# Patient Record
Sex: Female | Born: 1985 | Race: White | Hispanic: No | Marital: Single | Smoking: Former smoker
Health system: Southern US, Community
[De-identification: ages and names within clinical notes are randomized; demographics above are authoritative.]

## PROBLEM LIST (undated history)

## (undated) DIAGNOSIS — F909 Attention-deficit hyperactivity disorder, unspecified type: Secondary | ICD-10-CM

## (undated) DIAGNOSIS — F419 Anxiety disorder, unspecified: Secondary | ICD-10-CM

## (undated) DIAGNOSIS — F329 Major depressive disorder, single episode, unspecified: Secondary | ICD-10-CM

## (undated) DIAGNOSIS — E282 Polycystic ovarian syndrome: Secondary | ICD-10-CM

## (undated) DIAGNOSIS — F32A Depression, unspecified: Secondary | ICD-10-CM

## (undated) DIAGNOSIS — O3690X0 Maternal care for fetal problem, unspecified, unspecified trimester, not applicable or unspecified: Secondary | ICD-10-CM

## (undated) DIAGNOSIS — R569 Unspecified convulsions: Secondary | ICD-10-CM

## (undated) DIAGNOSIS — O4400 Placenta previa specified as without hemorrhage, unspecified trimester: Secondary | ICD-10-CM

## (undated) DIAGNOSIS — G40909 Epilepsy, unspecified, not intractable, without status epilepticus: Secondary | ICD-10-CM

## (undated) HISTORY — DX: Epilepsy, unspecified, not intractable, without status epilepticus: G40.909

## (undated) HISTORY — PX: HX BREAST REDUCTION: SHX8

## (undated) HISTORY — DX: Attention-deficit hyperactivity disorder, unspecified type: F90.9

## (undated) HISTORY — DX: Polycystic ovarian syndrome: E28.2

## (undated) HISTORY — DX: Anxiety disorder, unspecified: F41.9

## (undated) HISTORY — PX: HX TONSILLECTOMY: SHX27

## (undated) HISTORY — PX: HX ADENOIDECTOMY: SHX29

## (undated) HISTORY — PX: REVISION OF SCAR: SHX2348

## (undated) HISTORY — PX: TONSILLECTOMY: SUR1361

## (undated) HISTORY — PX: BREAST REDUCTION SURGERY: SHX8

---
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
## 2007-06-05 HISTORY — PX: REDUCTION MAMMAPLASTY: SUR839

## 2008-12-27 ENCOUNTER — Ambulatory Visit: Admission: RE | Admit: 2008-12-27 | Payer: Self-pay | Source: Ambulatory Visit | Admitting: Plastic Surgery

## 2009-02-03 ENCOUNTER — Emergency Department: Admit: 2009-02-03 | Payer: Self-pay | Source: Emergency Department | Admitting: Emergency Medicine

## 2009-02-04 ENCOUNTER — Ambulatory Visit
Admit: 2009-02-04 | Disposition: A | Discharge status: Home / Self Care | Payer: Self-pay | Source: Ambulatory Visit | Admitting: Plastic Surgery

## 2009-12-02 ENCOUNTER — Ambulatory Visit: Payer: Self-pay

## 2011-03-23 NOTE — Op Note (Unsigned)
Account Number: [id number]      Document ID: [id number]      Admit Date: 12/27/2008      Procedure Date: 12/27/2008            Patient Location: DISCHARGED 12/27/2008      Patient Type: A            SURGEON: Gilda Crease MD      ASSISTANT:                  PREOPERATIVE DIAGNOSIS:      Bilateral breast hypertrophy, symptomatic.            POSTOPERATIVE DIAGNOSIS:      Bilateral breast hypertrophy, symptomatic.            TITLE OF PROCEDURE:      Bilateral reduction mammoplasty            ASSISTANT:      Steward Ros, CSA            ANESTHESIA:      General, Dr. Micheline Rough.            INTRAVENOUS FLUIDS:      Two liters crystalloid.            ESTIMATED BLOOD LOSS:      200 mL.            DRAINS:      One 15-French Blake round fluted drain to anterior chest wall.            CATHETERS:      On-Q dual 300 mL 0.25% plain Marcaine in a 270 mL pump.            SPECIMENS:      Resected breast tissue.  Weight:  Right approximately 646 grams, left 962      grams.            COMPLICATIONS:      None.            COUNTS:      Correct at completion of procedure.            PATHOLOGY:      The patient is a 25 year old nulliparous female who presents with      symptomatic back, neck, shoulder strain, rashes under the breasts, grooving      in the shoulders for which she presents for medically necessary breast      reduction.  The patient's breasts are asymmetric with the left greater than      right, and this will be addressed in the reduction procedure.            DESCRIPTION OF PROCEDURE:      The patient was marked preoperatively in the holding area with an inferior      Weiss pattern inferior pedicle technique prior to being taken to the      operating room, placed on the table in supine position.  After satisfactory      general anesthesia was induced, the patient's chest was prepped and draped      in the usual sterile fashion.  I proceeded to mark 42-mm diameter native      nipple-areolar complexes bilaterally injected 100 mL of  0.5% Xylocaine with      epinephrine locally along the areas delineated to be incised.  I proceeded      on the right, placing a tourniquet around the breast, de-epithelializing a      6-cm  inferior pedicle and circumareolarly.  I then removed the tourniquet      and completed de-epithelialization down to the level of the inframammary      fold.  The redundant skin, subcutaneous, and glandular tissue from the      medial and lateral lower quadrants respectively was removed as well as from      the intervening tissue between the native nipple-areolar complex and the      desired new position.  Antibiotic irrigation was performed.  Hemostasis was      obtained with cautery.  Percutaneous placement of the 15-French Blake round      fluted drain was inserted in the lateral inframammary fold and secured with      3-0 nylon suture.  Percutaneous placement of the On-Q catheter was from the      medial lower quadrant and this was secured with Mastisol, Steri-Strips and      Op-Site.  I then approximated the medial and lateral skin flaps in the      median pole of the breast along the inframammary fold with a single 2-0      Vicryl suture.  Closure of the dermis was performed with 3-0 and 4-0 Vicryl      in the horizontal and vertical limbs.  I then brought the native      nipple-areolar complex 5 cm from the fold.  It was viable at the time it      was secured to the surrounding skin paddle using interrupted 5-0 Vicryl, a      5-0 Prolene pursestring suture, and a 5-0 Monocryl subcuticularly.  Closure      of the horizontal and vertical limbs was completed with 4-0 Monocryl      subcuticularly.  The drain was placed to self suction.  I then turned my      attention to the left breast and performed the same procedure, delineating      the native nipple-areolar complex at 42 mm, de-epithelializing      circumferentially and down to the level of the inframammary folds.      Excision of the redundant tissues was performed.   Hemostasis checked and      obtained with cautery.  Percutaneous placement of the drain in the lateral      inframammary fold and the On-Q catheters through the inframammary fold      medially and securing similarly to the chest wall.  Closure was performed      with 2-0, 3-0 and 4-0 Vicryl in the dermis, 4-0 Monocryl in the vertical      and horizontal limbs and the native nipple-areolar complex again was      brought out 5 cm from the fold along the vertical limb, excising the      overlying skin paddle.  Hemostasis again was obtained.  Native      nipple-areolar complex was viable when brought out and closure was      performed to the surrounding skin using 5-0 Vicryl, 5-0 Prolene      pursestring, and a 5-0 Monocryl.  The wounds were then dressed,  bacitracin      ointment and Xeroform placed on the nipple-areolar complexes.  Mastisol,      Steri-Strips, 4 x 4's, and ABDs to the entire wound and a post-surgical      compression bra.  While removing the drapes, the stitch on the left drain      placed in the  anterior wall broke and the drain dislodged and it was      decided not to replace it as the breast did not appear ecchymotic and would      require reopening the patient's chest wound.  The patient was extubated      without difficulty and talking prior to leaving the OR suite and arriving      in the recovery room in stable condition.  There were no complications.                              _______________________________     Date/Time Signed: _____________      Gilda Crease MD [phone])            D:  12/27/2008 15:00 PM by Dr. Norvel Richards. Kirt Boys, MD [phone])      T:  12/28/2008 08:58 AM by [id number]          Everlean Cherry: [id number]) (Doc ID: [id number])                  HY:QMVH Kirt Boys MD

## 2012-06-05 NOTE — Op Note (Unsigned)
SURGEON:             Emmie Niemann, MD      ADMITTED:            08/23/2003      PROCEDURE DATE: 08/23/2003      ROOM NUMBER:         POO POO 08            PREOPERATIVE DIAGNOSIS:  Chronic tonsillitis.            POSTOPERATIVE DIAGNOSIS:  Chronic tonsillitis.            PROCEDURE:  Tonsillectomy.            ASSISTANTS:  None.            ANESTHESIA:  General endotracheal.            SPECIMENS TO ANATOMICAL PATHOLOGY:  Palatine tonsils.            FINDINGS:  Cryptic tonsils with pericapsular fibrosis.            PROCEDURE IN DETAIL:  The patient was brought to the operating room, placed      on the operating table in the supine position.  Following induction of      general endotracheal anesthesia, the patient was positioned for      tonsillectomy and draped in the usual manner.  A Crowe-Davis mouth gag was      atraumatically introduced into the oral cavity which was retracted open,      and the mouth gag was then placed in gentle suspension using a Mayo stand.      Next, the left tonsil was grasped with a curved Allis clamp and dissected      out in the plane between the capsule and the underlying constrictor muscle      using a spatula-tipped Bovie.            In identical technical fashion, the right tonsil was removed.  Hemostasis      was then achieved using the bipolar cautery device followed by closure of      the tonsillar fossae by approximating the anterior and posterior pillars      using an inverted horizontal mattress stitch of 4-0 Vicryl suture.  Next,      the wounds were infiltrated with 0.5% plain Marcaine for a total of 10 mL      for the purpose of postsurgical analgesia.                              ___________________________________     Date Signed: _______________      Emmie Niemann, MD                  D 08/23/2003  8:27 P; T 08/24/2003 11:27 A; [id number] - - , [id number], #[id number]      CC:  Emmie Niemann, MD

## 2012-06-05 NOTE — Op Note (Unsigned)
SURGEON:             Emmie Niemann, MD      ADMITTED:            08/23/2003      PROCEDURE DATE: 08/23/2003      ROOM NUMBER:         POO POO 08            PREOPERATIVE DIAGNOSIS:  Chronic tonsillitis.            POSTOPERATIVE DIAGNOSIS:  Chronic tonsillitis.            PROCEDURE      1.   Tonsillectomy.      2.   Therapeutic injection.            ASSISTANTS:  None.            ANESTHESIA:  General endotracheal.            SPECIMENS TO ANATOMICAL PATHOLOGY:  Palatine tonsils.            FINDINGS:  Cryptic tonsils and peritonsillar fibrosis.            PROCEDURE IN DETAIL:  The patient was brought to the operating room, placed      on the operating table in the supine position.  Following induction of      general endotracheal anesthesia, the patient was positioned for      tonsillectomy and draped in the usual manner.  Next, a Crowe-Davis mouth      gag was introduced into the oral cavity which was retracted open, and the      mouth gag was then placed in gentle suspension using a Mayo stent.  The      left tonsil was grasped and removed using a spatula Bovie in the plane      between the capsule and the underlying constrictor muscle.            In identical fashion, the right tonsil was also removed.  Hemostasis was      achieved using the bipolar cautery device.  The anterior and posterior      tonsillar pillars were approximated using a horizontal mattress suture of      4-0 Vicryl followed by infiltration of the wounds with 0.5% plain Marcaine      for a total of 10 mL.      Mouth gag was then taken down and removed from the patient.  The head was      restored to neutral position for preparation of emergence from general      anesthetic.            The patient tolerated the procedure well and was extubated in the operating      room and brought to recovery room in excellent condition.                              ___________________________________     Date Signed: _______________      Emmie Niemann, MD                   D 08/23/2003  8:30 P; T 08/24/2003 11:30 A; 9229 - - , [id number], #[id number]      CC:  Emmie Niemann, MD

## 2013-12-21 ENCOUNTER — Emergency Department (HOSPITAL_COMMUNITY)
Admission: EM | Admit: 2013-12-21 | Discharge: 2013-12-22 | Disposition: A | Discharge status: Home / Self Care | Payer: BC Managed Care – PPO | Attending: Emergency Medicine | Admitting: Emergency Medicine

## 2013-12-21 ENCOUNTER — Emergency Department (HOSPITAL_COMMUNITY): Payer: BC Managed Care – PPO

## 2013-12-21 ENCOUNTER — Encounter (HOSPITAL_COMMUNITY): Payer: Self-pay | Admitting: Emergency Medicine

## 2013-12-21 DIAGNOSIS — W269XXA Contact with unspecified sharp object(s), initial encounter: Secondary | ICD-10-CM | POA: Insufficient documentation

## 2013-12-21 DIAGNOSIS — Z862 Personal history of diseases of the blood and blood-forming organs and certain disorders involving the immune mechanism: Secondary | ICD-10-CM | POA: Insufficient documentation

## 2013-12-21 DIAGNOSIS — F17200 Nicotine dependence, unspecified, uncomplicated: Secondary | ICD-10-CM | POA: Insufficient documentation

## 2013-12-21 DIAGNOSIS — F3289 Other specified depressive episodes: Secondary | ICD-10-CM | POA: Insufficient documentation

## 2013-12-21 DIAGNOSIS — F909 Attention-deficit hyperactivity disorder, unspecified type: Secondary | ICD-10-CM | POA: Insufficient documentation

## 2013-12-21 DIAGNOSIS — F411 Generalized anxiety disorder: Secondary | ICD-10-CM | POA: Insufficient documentation

## 2013-12-21 DIAGNOSIS — S0993XA Unspecified injury of face, initial encounter: Secondary | ICD-10-CM

## 2013-12-21 DIAGNOSIS — Z8639 Personal history of other endocrine, nutritional and metabolic disease: Secondary | ICD-10-CM | POA: Insufficient documentation

## 2013-12-21 DIAGNOSIS — Z23 Encounter for immunization: Secondary | ICD-10-CM | POA: Insufficient documentation

## 2013-12-21 DIAGNOSIS — Z3202 Encounter for pregnancy test, result negative: Secondary | ICD-10-CM | POA: Insufficient documentation

## 2013-12-21 DIAGNOSIS — Y9389 Activity, other specified: Secondary | ICD-10-CM | POA: Insufficient documentation

## 2013-12-21 DIAGNOSIS — F329 Major depressive disorder, single episode, unspecified: Secondary | ICD-10-CM | POA: Insufficient documentation

## 2013-12-21 DIAGNOSIS — Y929 Unspecified place or not applicable: Secondary | ICD-10-CM | POA: Insufficient documentation

## 2013-12-21 DIAGNOSIS — W503XXA Accidental bite by another person, initial encounter: Secondary | ICD-10-CM | POA: Insufficient documentation

## 2013-12-21 DIAGNOSIS — R259 Unspecified abnormal involuntary movements: Secondary | ICD-10-CM | POA: Insufficient documentation

## 2013-12-21 DIAGNOSIS — Z79899 Other long term (current) drug therapy: Secondary | ICD-10-CM | POA: Insufficient documentation

## 2013-12-21 DIAGNOSIS — IMO0002 Reserved for concepts with insufficient information to code with codable children: Secondary | ICD-10-CM | POA: Insufficient documentation

## 2013-12-21 DIAGNOSIS — T148XXA Other injury of unspecified body region, initial encounter: Secondary | ICD-10-CM

## 2013-12-21 HISTORY — DX: Polycystic ovarian syndrome: E28.2

## 2013-12-21 HISTORY — DX: Major depressive disorder, single episode, unspecified: F32.9

## 2013-12-21 HISTORY — DX: Attention-deficit hyperactivity disorder, unspecified type: F90.9

## 2013-12-21 HISTORY — DX: Anxiety disorder, unspecified: F41.9

## 2013-12-21 HISTORY — DX: Depression, unspecified: F32.A

## 2013-12-21 LAB — CBC WITH DIFFERENTIAL/PLATELET
Basophils Absolute: 0.1 10*3/uL (ref 0.0–0.1)
Basophils Relative: 0 % (ref 0–1)
Eosinophils Absolute: 0.1 10*3/uL (ref 0.0–0.7)
Eosinophils Relative: 1 % (ref 0–5)
HEMATOCRIT: 41.7 % (ref 36.0–46.0)
HEMOGLOBIN: 14 g/dL (ref 12.0–15.0)
LYMPHS PCT: 23 % (ref 12–46)
Lymphs Abs: 3.3 10*3/uL (ref 0.7–4.0)
MCH: 29.4 pg (ref 26.0–34.0)
MCHC: 33.6 g/dL (ref 30.0–36.0)
MCV: 87.4 fL (ref 78.0–100.0)
MONOS PCT: 6 % (ref 3–12)
Monocytes Absolute: 0.8 10*3/uL (ref 0.1–1.0)
NEUTROS ABS: 10.1 10*3/uL — AB (ref 1.7–7.7)
NEUTROS PCT: 70 % (ref 43–77)
Platelets: 374 10*3/uL (ref 150–400)
RBC: 4.77 MIL/uL (ref 3.87–5.11)
RDW: 13.1 % (ref 11.5–15.5)
WBC: 14.4 10*3/uL — AB (ref 4.0–10.5)

## 2013-12-21 LAB — COMPREHENSIVE METABOLIC PANEL
ALBUMIN: 3.8 g/dL (ref 3.5–5.2)
ALK PHOS: 68 U/L (ref 39–117)
ALT: 30 U/L (ref 0–35)
ANION GAP: 14 (ref 5–15)
AST: 23 U/L (ref 0–37)
BUN: 7 mg/dL (ref 6–23)
CALCIUM: 9.7 mg/dL (ref 8.4–10.5)
CO2: 25 mEq/L (ref 19–32)
CREATININE: 0.85 mg/dL (ref 0.50–1.10)
Chloride: 99 mEq/L (ref 96–112)
GFR calc non Af Amer: >90 mL/min (ref >90)
GLUCOSE: 96 mg/dL (ref 70–99)
Potassium: 3.8 mEq/L (ref 3.7–5.3)
Sodium: 138 mEq/L (ref 137–147)
Total Bilirubin: 0.3 mg/dL (ref 0.3–1.2)
Total Protein: 7.6 g/dL (ref 6.0–8.3)

## 2013-12-21 LAB — PREGNANCY, URINE: Preg Test, Ur: NEGATIVE

## 2013-12-21 LAB — ETHANOL

## 2013-12-21 LAB — MAGNESIUM: Magnesium: 1.8 mg/dL (ref 1.5–2.5)

## 2013-12-21 LAB — RAPID URINE DRUG SCREEN, HOSP PERFORMED
Amphetamines: POSITIVE — AB
BENZODIAZEPINES: POSITIVE — AB
Barbiturates: NOT DETECTED
Cocaine: NOT DETECTED
OPIATES: NOT DETECTED
Tetrahydrocannabinol: POSITIVE — AB

## 2013-12-21 MED ORDER — TETANUS-DIPHTH-ACELL PERTUSSIS 5-2.5-18.5 LF-MCG/0.5 IM SUSP
0.5000 mL | Freq: Once | INTRAMUSCULAR | Status: AC
  Administered 2013-12-21: 0.5 mL via INTRAMUSCULAR
  Filled 2013-12-21: qty 0.5

## 2013-12-21 NOTE — ED Provider Notes (Signed)
Medical screening examination/treatment/procedure(s) were performed by non-physician practitioner and as supervising physician I was immediately available for consultation/collaboration.   EKG Interpretation None        Tameika Heckmann, MD 12/21/13 2327 

## 2013-12-21 NOTE — Progress Notes (Signed)
  CARE MANAGEMENT ED NOTE 12/21/2013  Patient:  Regina EckFRITZ,Savon   Account Number:  [id number]  Date Initiated:  12/21/2013  Documentation initiated by:  Radford PaxFERRERO,Laurelle Skiver  Subjective/Objective Assessment:   Patient presents to Ed feeling shakey after noticing cuts to fingers, bit her tongue.     Subjective/Objective Assessment Detail:   Patient with pmhx of PCOS, anxiety, depression, ADHD     Action/Plan:   Action/Plan Detail:   Anticipated DC Date:       Status Recommendation to Physician:   Result of Recommendation:    Other ED Services  Consult Working Plan    DC Planning Services  Other  PCP issues    Choice offered to / List presented to:            Status of service:  Completed, signed off  ED Comments:   ED Comments Detail:  EDCM spoke to patient at bedside.  Patient reports her pcp is Dr. Farris HasAaron Morrow.  System updated.

## 2013-12-21 NOTE — ED Notes (Signed)
Patient states she was sitting at her computer working and suddenly realized she was bleeding on he right hand, and had bitten her tongue. Bite marks visualized to left side of tongue. Patient also c/o left chin/jaw pain and left shoulder pain. Patient unsure if she had LOC, remembers events up to and following, but is unable to state what happened to cause bleeding, pain. Patient c/o feeling very tired and generalized weakness. Patient denies any prior similar episodes, denies seizure history. Patient alert and oriented at this time. Patient was home alone when event occurred.

## 2013-12-21 NOTE — ED Provider Notes (Signed)
CSN: [id number]     Arrival date & time 12/21/13  1946 History   First MD Initiated Contact with Patient 12/21/13 2104     Chief Complaint  Patient presents with  . Shaking     (Consider location/radiation/quality/duration/timing/severity/associated sxs/prior Treatment) HPI Comments: Patient is a 28 year old female with history of PCOS, anxiety, depression, ADHD who presents today after an episode where she cut her hand and tongue. She reports that she was working at her kitchen table on the computer. If she was very focused with what she was doing. She was sitting in a chair. She realized that she was bleeding. There is blood on the table and chair. She has abrasions to her right first, second, fifth finger and her left elbow. She has abrasions to her left tongue. She does not remember how she obtained these abrasions. She states when she realized she was bleeding she was aware of what she was doing. She does not remember any trauma to her body. She was in the same spot she was in prior to this episode. She remained in a sitting position. No urinary incontinece. She states that she felt well, but later became generally very weak. This was not witnessed by anyone. She's never had anything like this in the past. No recent medication changes. She is unsure of her last tetanus shot.   The history is provided by the patient. No language interpreter was used.    Past Medical History  Diagnosis Date  . PCOS (polycystic ovarian syndrome)   . Anxiety   . Depression   . ADHD (attention deficit hyperactivity disorder)    Past Surgical History  Procedure Laterality Date  . Breast reduction surgery    . Tonsillectomy     No family history on file. History  Substance Use Topics  . Smoking status: Current Some Day Smoker  . Smokeless tobacco: Not on file  . Alcohol Use: Yes     Comment: socially   OB History   Grav Para Term Preterm Abortions TAB SAB Ect Mult Living                 Review  of Systems  Constitutional: Negative for fever and chills.  Respiratory: Negative for shortness of breath.   Cardiovascular: Negative for chest pain.  Skin: Positive for wound.  Neurological: Positive for seizures (possible).  All other systems reviewed and are negative.     Allergies  Review of patient's allergies indicates no known allergies.  Home Medications   Prior to Admission medications   Medication Sig Start Date End Date Taking? Authorizing Provider  amphetamine-dextroamphetamine (ADDERALL) 20 MG tablet Take 20 mg by mouth 2 (two) times daily.   Yes Historical Provider, MD  FLUoxetine (PROZAC) 20 MG capsule Take 60 mg by mouth daily.   Yes Historical Provider, MD  metFORMIN (GLUCOPHAGE) 500 MG tablet Take 500 mg by mouth 3 (three) times daily.   Yes Historical Provider, MD  Norgestimate-Eth Estradiol (SPRINTEC 28 PO) Take 1 tablet by mouth daily.   Yes Historical Provider, MD  spironolactone (ALDACTONE) 50 MG tablet Take 50 mg by mouth daily.   Yes Historical Provider, MD   BP 136/91  Pulse 108  Temp(Src) 98.6 F (37 C) (Oral)  Resp 18  Ht 5\' 5"  (1.651 m)  Wt 220 lb (99.791 kg)  BMI 36.61 kg/m2  SpO2 98%  LMP 12/19/2013 Physical Exam  Nursing note and vitals reviewed. Constitutional: She is oriented to person, place, and time. She appears  well-developed and well-nourished. No distress.  No acute distress  HENT:  Head: Normocephalic and atraumatic.  Right Ear: External ear normal.  Left Ear: External ear normal.  Nose: Nose normal.  Mouth/Throat: Oropharynx is clear and moist.  Superficial abrasions to left side of tongue. No broken or loose teeth.   Eyes: Conjunctivae and EOM are normal. Pupils are equal, round, and reactive to light.  Neck: Normal range of motion.  Cardiovascular: Normal rate, regular rhythm, normal heart sounds, intact distal pulses and normal pulses.   Pulses:      Radial pulses are 2+ on the right side, and 2+ on the left side.        Posterior tibial pulses are 2+ on the right side, and 2+ on the left side.  Pulmonary/Chest: Effort normal and breath sounds normal. No stridor. No respiratory distress. She has no wheezes. She has no rales.  Abdominal: Soft. She exhibits no distension. There is no tenderness.  Musculoskeletal: Normal range of motion.  Moves all extremities without guarding or ataxia.   Neurological: She is alert and oriented to person, place, and time. She has normal strength. No cranial nerve deficit (III-XII). Coordination and gait normal. GCS eye subscore is 4. GCS verbal subscore is 5. GCS motor subscore is 6.  Grip strength 5/5 bilaterally. Finger nose finger normal.  Strength 5/5 in all extremities.   Skin: Skin is warm and dry. Abrasion noted. She is not diaphoretic. No erythema.  Superficial abrasions to right 1, 2, and 5th fingers at PIP. Superficial abrasion to left elbow.  Psychiatric: She has a normal mood and affect. Her behavior is normal.    ED Course  Procedures (including critical care time) Labs Review Labs Reviewed  URINALYSIS, ROUTINE W REFLEX MICROSCOPIC - Abnormal; Notable for the following:    Color, Urine AMBER (*)    APPearance TURBID (*)    Hgb urine dipstick TRACE (*)    All other components within normal limits  URINE RAPID DRUG SCREEN (HOSP PERFORMED) - Abnormal; Notable for the following:    Benzodiazepines POSITIVE (*)    Amphetamines POSITIVE (*)    Tetrahydrocannabinol POSITIVE (*)    All other components within normal limits  CBC WITH DIFFERENTIAL - Abnormal; Notable for the following:    WBC 14.4 (*)    Neutro Abs 10.1 (*)    All other components within normal limits  URINE MICROSCOPIC-ADD ON - Abnormal; Notable for the following:    Squamous Epithelial / LPF FEW (*)    Bacteria, UA MANY (*)    All other components within normal limits  PREGNANCY, URINE  ETHANOL  COMPREHENSIVE METABOLIC PANEL  MAGNESIUM    Imaging Review Ct Head Wo Contrast  12/21/2013    CLINICAL DATA:  Question of seizure. Found cuts on right hand, and bit tongue. Shakiness.  EXAM: CT HEAD WITHOUT CONTRAST  TECHNIQUE: Contiguous axial images were obtained from the base of the skull through the vertex without intravenous contrast.  COMPARISON:  None.  FINDINGS: There is no evidence of acute infarction, mass lesion, or intra- or extra-axial hemorrhage on CT.  The posterior fossa, including the cerebellum, brainstem and fourth ventricle, is within normal limits. The third and lateral ventricles, and basal ganglia are unremarkable in appearance. The cerebral hemispheres are symmetric in appearance, with normal gray-white differentiation. No mass effect or midline shift is seen.  There is no evidence of fracture; visualized osseous structures are unremarkable in appearance. The orbits are within normal limits. The paranasal  sinuses and mastoid air cells are well-aerated. No significant soft tissue abnormalities are seen.  IMPRESSION: Unremarkable noncontrast CT of the head.   Electronically Signed   By: Roanna RaiderJeffery  Chang M.D.   On: 12/21/2013 23:55     EKG Interpretation   Date/Time:  Monday December 21 2013 23:28:11 EDT Ventricular Rate:  79 PR Interval:  116 QRS Duration: 90 QT Interval:  397 QTC Calculation: 455 R Axis:   37 Text Interpretation:  Sinus rhythm Borderline short PR interval Low  voltage, precordial leads No old tracing to compare Confirmed by CAMPOS   MD, Caryn BeeKEVIN ([id number]) on 12/21/2013 11:45:09 PM      MDM   Final diagnoses:  Abrasion  Tongue injury, initial encounter   Patient presents to ED after having an episode of tongue biting and sustaining abrasions for which she does not remember. Patient is currently at her baseline. Labs and imaging unremarkable. No hx or Fhx of seizures. This story is concerning for seizure. Patient was instructed to follow up with neurology as an outpatient. She understands that she cannot drive until she has been cleared by neurology. I  discussed reasons to return to ED immediately. Vital signs stable for discharge. Patient / Family / Caregiver informed of clinical course, understand medical decision-making process, and agree with plan.     Mora BellmanHannah S Columbia Pandey, PA-C 12/22/13 1110

## 2013-12-21 NOTE — ED Notes (Signed)
Pt states that she was sitting at the table working on the computer for work; pt states that she was concentrating on what she was doing and then she noticed that she had some cuts to her rt 1st, 2nd and 5th finger; superficial cuts - bleeding controlled; pt states that she had also bit her tongue; pt does not know what happened  To her hand; pt denies injury; pt denies LOC; pt states that after she noticed it she felt "shakey"; pt denies shakiness at present

## 2013-12-21 NOTE — ED Provider Notes (Signed)
MSE was initiated and I personally evaluated the patient and placed orders (if any) at  10:29 PM on December 21, 2013.  Patient is a 28 year old female with a history of PCOS, anxiety, depression, and ADHD who presents to the emergency department following episode of right hand injury and tongue biting. Patient states that she was sitting in a high rise chair in her kitchen when she all of a sudden noticed blood on her shirt and the chair next to her. Patient states that she looked down and noted abrasions to the PIP of her 2nd, 3rd, and 5th digits of her R hand. She states she also felt paresthesias to the L side of her face as well as noticing associated trauma to the L side of her tongue. Patient unsure of LOC. Denies known falls or head trauma. No hx of seizures or FHx of seizures. No recent medication changes.  Brief neurologic exam is nonfocal. Heart RRR and lungs CTAB. Speech is goal oriented and GCS 15. Patient c/o generalized fatigue at this time. Story concerning for seizures. Work up initiated to this effect.  The patient appears stable so that the remainder of the MSE may be completed by another provider.  Antony MaduraKelly Krithik Mapel, PA-C 12/21/13 2234  Antony MaduraKelly Mieshia Pepitone, PA-C 12/21/13 2234

## 2013-12-22 LAB — URINALYSIS, ROUTINE W REFLEX MICROSCOPIC
BILIRUBIN URINE: NEGATIVE
Glucose, UA: NEGATIVE mg/dL
Ketones, ur: NEGATIVE mg/dL
Leukocytes, UA: NEGATIVE
Nitrite: NEGATIVE
PH: 5.5 (ref 5.0–8.0)
Protein, ur: NEGATIVE mg/dL
SPECIFIC GRAVITY, URINE: 1.027 (ref 1.005–1.030)
Urobilinogen, UA: 0.2 mg/dL (ref 0.0–1.0)

## 2013-12-22 LAB — URINE MICROSCOPIC-ADD ON

## 2013-12-22 NOTE — Discharge Instructions (Signed)
Today your story is concerning for seizure. Please do not drive until you have been cleared by a neurologist.   Abrasions An abrasion is a cut or scrape of the skin. Abrasions do not go through all layers of the skin. HOME CARE  If a bandage (dressing) was put on your wound, change it as told by your doctor. If the bandage sticks, soak it off with warm.  Wash the area with water and soap 2 times a day. Rinse off the soap. Pat the area dry with a clean towel.  Put on medicated cream (ointment) as told by your doctor.  Change your bandage right away if it gets wet or dirty.  Only take medicine as told by your doctor.  See your doctor within 24-48 hours to get your wound checked.  Check your wound for redness, puffiness (swelling), or yellowish-white fluid (pus). GET HELP RIGHT AWAY IF:   You have more pain in the wound.  You have redness, swelling, or tenderness around the wound.  You have pus coming from the wound.  You have a fever or lasting symptoms for more than 2-3 days.  You have a fever and your symptoms suddenly get worse.  You have a bad smell coming from the wound or bandage. MAKE SURE YOU:   Understand these instructions.  Will watch your condition.  Will get help right away if you are not doing well or get worse. Document Released: 11/07/2007 Document Revised: 02/13/2012 Document Reviewed: 04/24/2011 [facility] Patient Information 2015 [employer] [place]. This information is not intended to replace advice given to you by your health care provider. Make sure you discuss any questions you have with your health care provider.

## 2013-12-23 NOTE — ED Provider Notes (Signed)
Medical screening examination/treatment/procedure(s) were performed by non-physician practitioner and as supervising physician I was immediately available for consultation/collaboration.   EKG Interpretation   Date/Time:  Monday December 21 2013 23:28:11 EDT Ventricular Rate:  79 PR Interval:  116 QRS Duration: 90 QT Interval:  397 QTC Calculation: 455 R Axis:   37 Text Interpretation:  Sinus rhythm Borderline short PR interval Low  voltage, precordial leads No old tracing to compare Confirmed by Gianina Olinde   MD, Jolene Guyett ([id number]) on 12/21/2013 11:45:09 PM        Lyanne CoKevin M Baelyn Doring, MD 12/23/13 0400

## 2013-12-25 ENCOUNTER — Other Ambulatory Visit: Payer: Self-pay | Admitting: Neurology

## 2013-12-25 ENCOUNTER — Encounter: Payer: Self-pay | Admitting: Neurology

## 2013-12-25 ENCOUNTER — Ambulatory Visit (INDEPENDENT_AMBULATORY_CARE_PROVIDER_SITE_OTHER): Payer: BC Managed Care – PPO | Admitting: Neurology

## 2013-12-25 ENCOUNTER — Ambulatory Visit (INDEPENDENT_AMBULATORY_CARE_PROVIDER_SITE_OTHER): Payer: BC Managed Care – PPO

## 2013-12-25 VITALS — BP 122/82 | HR 96 | Ht 66.0 in | Wt 223.0 lb

## 2013-12-25 DIAGNOSIS — R404 Transient alteration of awareness: Secondary | ICD-10-CM

## 2013-12-25 DIAGNOSIS — R569 Unspecified convulsions: Secondary | ICD-10-CM

## 2013-12-25 DIAGNOSIS — R4182 Altered mental status, unspecified: Secondary | ICD-10-CM | POA: Insufficient documentation

## 2013-12-25 NOTE — Progress Notes (Signed)
[facility]    Provider:  Dr Hosie Poisson Referring Provider: Farris Has, MD Primary Care Physician:  Farris Has, MD  CC:  AMS  HPI:  Melissa Pennington is a 28 y.o. female here as a referral from Dr. Kateri Plummer for period of altered mental status  Per ED notes she was evaluated on 12/22/2013 after an episode where she cut her hand and tongue but was unaware how this happened. Notes sitting on a stool working on the computer, was very focused on her work. Next thing she knew she was bleeding, with blood on table and chair. Had abrasions on right fingers and left elbow. Notes being in the same spot she was prior to noticing the bleeding. As far as she knows she remained in a sitting position during the entire episode. Felt well at the time but later felt fatigued with generalized weakness. Had head CT, images were reviewed and appear unremarkable. There were not any recent medication changes, no fevers, no recent illnesses.   Has history of PCOS, anxiety, depression ADHD.   Review of Systems: Out of a complete 14 system review, the patient complains of only the following symptoms, and all other reviewed systems are negative. + memory loss, confusion, insomnia, sleepiness, snoring, restless legs, depression, anxiety  History   Social History  . Marital Status: Single    Spouse Name: N/A    Number of Children: 0  . Years of Education: 12+   Occupational History  . Student     Social History Main Topics  . Smoking status: Current Some Day Smoker  . Smokeless tobacco: Never Used  . Alcohol Use: Yes     Comment: socially  . Drug Use: No  . Sexual Activity: Yes    Birth Control/ Protection: Pill   Other Topics Concern  . Not on file   Social History Narrative   Patient lives at home alone.    Patient has no children.    Patient is single.    Patient works at [employer]   Patient is a Archivist.           History reviewed. No pertinent family  history.  Past Medical History  Diagnosis Date  . PCOS (polycystic ovarian syndrome)   . Anxiety   . Depression   . ADHD (attention deficit hyperactivity disorder)     Past Surgical History  Procedure Laterality Date  . Breast reduction surgery    . Tonsillectomy      Current Outpatient Prescriptions  Medication Sig Dispense Refill  . amphetamine-dextroamphetamine (ADDERALL) 20 MG tablet Take 20 mg by mouth 2 (two) times daily.      Marland Kitchen FLUoxetine (PROZAC) 20 MG capsule Take 60 mg by mouth daily.      . metFORMIN (GLUCOPHAGE) 500 MG tablet Take 500 mg by mouth 3 (three) times daily.      Suzzanne Cloud Estradiol (SPRINTEC 28 PO) Take 1 tablet by mouth daily.      Marland Kitchen spironolactone (ALDACTONE) 50 MG tablet Take 50 mg by mouth daily.       No current facility-administered medications for this visit.    Allergies as of 12/25/2013  . (No Known Allergies)    Vitals: BP 122/82  Pulse 96  Ht 5\' 6"  (1.676 m)  Wt 223 lb (101.152 kg)  BMI 36.01 kg/m2  LMP 12/19/2013 Last Weight:  Wt Readings from Last 1 Encounters:  12/25/13 223 lb (101.152 kg)   Last Height:   Ht Readings from Last  1 Encounters:  12/25/13 5\' 6"  (1.676 m)     Physical exam: Exam: Gen: NAD, conversant Eyes: anicteric sclerae, moist conjunctivae HENT: Atraumatic, oropharynx clear Neck: Trachea midline; supple,  Lungs: CTA, no wheezing, rales, rhonic                          CV: RRR, no MRG Abdomen: Soft, non-tender;  Extremities: No peripheral edema  Skin: Normal temperature, no rash,  Psych: Appropriate affect, pleasant  Neuro: MS: AA&Ox3, appropriately interactive, normal affect   Speech: fluent w/o paraphasic error  Memory: good recent and remote recall  CN: PERRL dilated but briskly reactive, EOMI no nystagmus, no ptosis, sensation intact to LT V1-V3 bilat, face symmetric, no weakness, hearing grossly intact, palate elevates symmetrically, shoulder shrug 5/5 bilat,  tongue protrudes  midline, no fasiculations noted.  Motor: normal bulk and tone Strength: 5/5  In all extremities  Coord: rapid alternating and point-to-point (FNF, HTS) movements intact.  Reflexes: brisk symmetrical, no clonus, bilat downgoing toes  Sens: LT intact in all extremities  Gait: posture, stance, stride and arm-swing normal. Tandem gait intact. Able to walk on heels and toes. Romberg absent.   Assessment:  After physical and neurologic examination, review of laboratory studies, imaging, neurophysiology testing and pre-existing records, assessment will be reviewed on the problem list.  Plan:  Treatment plan and additional workup will be reviewed under Problem List.  1)AMS 2)Anxiety/depression  28y/o woman presenting for initial evaluation of episode where she acutely injured herself without being aware of what happened. Event is concerning for a possible seizure episode though it is atypical that she did not fall from the chair or disrupt her surroundings yet still injured herself. Question if stress/anxiety played a role in her episode. Head CT and neurological normal with exception of dilated but reactive pupils and brisk but symmetrical reflexes. Will check EEG. Counseled patient to avoid driving pending results of EEG.    Melissa ChoPeter Rami Budhu, DO  [facility] [address] [place], [place] [address]  Phone [phone] Fax [phone]

## 2013-12-25 NOTE — Patient Instructions (Signed)
Overall you are doing fairly well but I do want to suggest a few things today:   Remember to drink plenty of fluid, eat healthy meals and do not skip any meals. Try to eat protein with a every meal and eat a healthy snack such as fruit or nuts in between meals. Try to keep a regular sleep-wake schedule and try to exercise daily, particularly in the form of walking, 20-30 minutes a day, if you can.   As far as diagnostic testing:  1)I would like you to have an EEG. Please schedule this when you check out.   Please avoid driving pending the results of your EEG.   We will follow up with you once the EEG is completed. Please call us with any interim questions, concerns, problems, updates or refill requests.   My clinical assistant and will answer any of your questions and relay your messages to me and also relay most of my messages to you.   Our phone number is [phone]. We also have an after hours call service for urgent matters and there is a physician on-call for urgent questions. For any emergencies you know to call 911 or go to the nearest emergency room

## 2013-12-25 NOTE — Procedures (Signed)
      Regina EckKelly Yacoub is a [age] are old patient with a history of an episode of an injury on 12/22/2013 but she cut her hand and tongue but had no recollection of how the event occurred. The patient is being evaluated for possible unwitnessed seizure event.   This is a routine EEG. No skull defects are noted. Medications include Adderall, Prozac, Glucophage, birth control pills, and Aldactone.  EEG classification: Dysrhythmia grade 1 left hemisphere  Description of the recording: The background rhythms of this recording consists of a fairly well modulated medium amplitude alpha rhythm of 9 Hz that is reactive to eye opening and closure. As the record progresses, there appears to be a very subtle 5 or 6 Hz theta frequency slowing that is present in the left hemisphere. At times, the asymmetry of slowing is more prominent. Photic stimulation is performed, and this results in a bilateral photic driving response. Hyperventilation is also performed, resulting in a minimal buildup of the background rhythm activities. Towards the end of the recording, the patient appears to enter the drowsy state, but she never clearly enters stage II sleep. At no time during the recording does there appear to be evidence of spike or spike wave discharges. EKG monitor shows no evidence of cardiac rhythm abnormalities with a heart rate of 96.  Impression: This is a minimally abnormal EEG recording secondary to subtle theta slowing emanating from the left hemisphere. This study would suggest a mild abnormality within the left brain, and this could potentially represent interictal activity. No epileptiform discharges were seen.

## 2013-12-28 ENCOUNTER — Telehealth: Payer: Self-pay | Admitting: Neurology

## 2013-12-28 NOTE — Telephone Encounter (Signed)
Spoke to patient and she is aware of EEG results. Patient was also made aware that she will be having a MRI done. Someone will call her once her insurance was verified and approved.

## 2013-12-28 NOTE — Telephone Encounter (Signed)
Message copied by Ardeth SportsmanHODES, Kanetra Ho L on Mon Dec 28, 2013 10:58 AM ------      Message from: Ramond MarrowSUMNER, PETER J      Created: Fri Dec 25, 2013  4:31 PM       Please let her know her EEG is mildly abnormal and I would like to get a MRI to investigate it further. Thanks. ------

## 2013-12-28 NOTE — Telephone Encounter (Signed)
Pt requesting EEG results.  Please call and advise.  Thanks

## 2013-12-31 ENCOUNTER — Ambulatory Visit (INDEPENDENT_AMBULATORY_CARE_PROVIDER_SITE_OTHER): Payer: BC Managed Care – PPO

## 2013-12-31 DIAGNOSIS — R569 Unspecified convulsions: Secondary | ICD-10-CM

## 2013-12-31 MED ORDER — GADOPENTETATE DIMEGLUMINE 469.01 MG/ML IV SOLN: 20.0000 mL | Freq: Once | INTRAVENOUS | Status: AC | PRN

## 2014-01-04 ENCOUNTER — Telehealth: Payer: Self-pay | Admitting: Neurology

## 2014-01-04 NOTE — Telephone Encounter (Signed)
Spoke w/ pt and relayed the results of her MRI.  She was reminded of her f/u appointment on 01/12/2014.

## 2014-01-04 NOTE — Telephone Encounter (Signed)
Patient requesting MRI results.  Call anytime and leave message if not available.

## 2014-01-06 ENCOUNTER — Telehealth: Payer: Self-pay | Admitting: Neurology

## 2014-01-06 NOTE — Telephone Encounter (Signed)
Patient states she had MRI with contrast on 7/30.  Experiencing a swollen and sore vein.  Please call and advise anytime, may leave message if not available.  Thanks

## 2014-01-07 NOTE — Telephone Encounter (Signed)
I called and LMVM for pt that they are to call back for us to get more information.  Heat is good, but are you running fever, how swollen, red/inflamed, infected??

## 2014-01-11 NOTE — Telephone Encounter (Signed)
I called pt again and asked if better.   She stated swelling is down although vein is sore.  I told her heat is good for increasing circulation, taking motrin if she can to help with pain and inflammation.  She has appt here tomorrow with Dr. Hosie PoissonSumner, will have him take a look at.

## 2014-01-12 ENCOUNTER — Ambulatory Visit (INDEPENDENT_AMBULATORY_CARE_PROVIDER_SITE_OTHER): Payer: BC Managed Care – PPO | Admitting: Neurology

## 2014-01-12 ENCOUNTER — Encounter: Payer: Self-pay | Admitting: Neurology

## 2014-01-12 VITALS — BP 109/70 | HR 97 | Ht 66.0 in | Wt 223.8 lb

## 2014-01-12 DIAGNOSIS — R404 Transient alteration of awareness: Secondary | ICD-10-CM

## 2014-01-12 MED ORDER — LEVETIRACETAM 500 MG PO TABS: 500.0000 mg | ORAL_TABLET | Freq: Two times a day (BID) | ORAL | Status: DC

## 2014-01-12 NOTE — Progress Notes (Signed)
[facility]    Provider:  Dr Hosie Poisson Referring Provider: Farris Has, MD Primary Care Physician:  Farris Has, MD  CC:  AMS  HPI:  Melissa Pennington is a 28 y.o. female here as a follow up from Dr. Kateri Plummer for period of altered mental status. Since last visit she has had a normal brain MRI and an EEG done showing subtle slowing of the left hemisphere. No further episodes of AMS. She expresses a lot of anxiety over whether or not this represents a seizure.    Initial visit 12/2013: Per ED notes she was evaluated on 12/22/2013 after an episode where she cut her hand and tongue but was unaware how this happened. Notes sitting on a stool working on the computer, was very focused on her work. Next thing she knew she was bleeding, with blood on table and chair. Had abrasions on right fingers and left elbow. Notes being in the same spot she was prior to noticing the bleeding. As far as she knows she remained in a sitting position during the entire episode. Felt well at the time but later felt fatigued with generalized weakness. Had head CT, images were reviewed and appear unremarkable. There were not any recent medication changes, no fevers, no recent illnesses.   Has history of PCOS, anxiety, depression ADHD.   Review of Systems: Out of a complete 14 system review, the patient complains of only the following symptoms, and all other reviewed systems are negative. + memory loss, confusion, insomnia, sleepiness, snoring, restless legs, depression, anxiety  History   Social History  . Marital Status: Single    Spouse Name: N/A    Number of Children: 0  . Years of Education: 12+   Occupational History  . Student     Social History Main Topics  . Smoking status: Current Some Day Smoker  . Smokeless tobacco: Never Used  . Alcohol Use: Yes     Comment: socially  . Drug Use: No  . Sexual Activity: Yes    Birth Control/ Protection: Pill   Other Topics Concern  . Not on file     Social History Narrative   Patient lives at home alone.    Patient has no children.    Patient is single.    Patient works at [employer]   Patient is a Archivist.           History reviewed. No pertinent family history.  Past Medical History  Diagnosis Date  . PCOS (polycystic ovarian syndrome)   . Anxiety   . Depression   . ADHD (attention deficit hyperactivity disorder)     Past Surgical History  Procedure Laterality Date  . Breast reduction surgery    . Tonsillectomy      Current Outpatient Prescriptions  Medication Sig Dispense Refill  . amphetamine-dextroamphetamine (ADDERALL) 20 MG tablet Take 20 mg by mouth 2 (two) times daily.      Marland Kitchen FLUoxetine (PROZAC) 20 MG capsule Take 60 mg by mouth daily.      . metFORMIN (GLUCOPHAGE) 500 MG tablet Take 500 mg by mouth 3 (three) times daily.      Suzzanne Cloud Estradiol (SPRINTEC 28 PO) Take 1 tablet by mouth daily.      Marland Kitchen spironolactone (ALDACTONE) 50 MG tablet Take 50 mg by mouth daily.       No current facility-administered medications for this visit.    Allergies as of 01/12/2014  . (No Known Allergies)    Vitals: BP 109/70  Pulse 97  Ht 5\' 6"  (1.676 m)  Wt 223 lb 12.8 oz (101.515 kg)  BMI 36.14 kg/m2  LMP 12/19/2013 Last Weight:  Wt Readings from Last 1 Encounters:  01/12/14 223 lb 12.8 oz (101.515 kg)   Last Height:   Ht Readings from Last 1 Encounters:  01/12/14 5\' 6"  (1.676 m)     Physical exam: Exam: Gen: NAD, conversant Eyes: anicteric sclerae, moist conjunctivae HENT: Atraumatic, oropharynx clear Neck: Trachea midline; supple,  Lungs: CTA, no wheezing, rales, rhonic                          CV: RRR, no MRG Abdomen: Soft, non-tender;  Extremities: No peripheral edema  Skin: Normal temperature, no rash,  Psych: Appropriate affect, pleasant  Neuro: MS: AA&Ox3, appropriately interactive, normal affect   Speech: fluent w/o paraphasic error  Memory: good recent and  remote recall  CN: PERRL dilated but briskly reactive, EOMI no nystagmus, no ptosis, sensation intact to LT V1-V3 bilat, face symmetric, no weakness, hearing grossly intact, palate elevates symmetrically, shoulder shrug 5/5 bilat,  tongue protrudes midline, no fasiculations noted.  Motor: normal bulk and tone Strength: 5/5  In all extremities  Coord: rapid alternating and point-to-point (FNF, HTS) movements intact.  Reflexes: brisk symmetrical, no clonus, bilat downgoing toes  Sens: LT intact in all extremities  Gait: posture, stance, stride and arm-swing normal. Tandem gait intact. Able to walk on heels and toes. Romberg absent.   Assessment:  After physical and neurologic examination, review of laboratory studies, imaging, neurophysiology testing and pre-existing records, assessment will be reviewed on the problem list.  Plan:  Treatment plan and additional workup will be reviewed under Problem List.  1)AMS 2)Anxiety/depression  28y/o woman presenting for follow evaluation of episode where she acutely injured herself without being aware of what happened. Event is concerning for a possible seizure episode though it is atypical that she did not fall from the chair or disrupt her surroundings yet still injured herself. Question if stress/anxiety played a role in her episode. Neurological exam normal with exception of dilated but reactive pupils and brisk but symmetrical reflexes. MRI brain normal. EEG shows some left sided slowing. Event description is not typical for a seizure but abnormal EEG raises the possibility. Discussed options with patient, including no AED vs starting AED. Patient wishes to start AED, will start Keppra 500mg  BID. Will plan to have patient follow up in 4 months. Will repeat EEG at that time. If normal will plan to taper off keppra. Patient counseled to refrain from driving for 6 months from event.    Elspeth ChoPeter Allysha Tryon, DO  [facility] [address] [place], [place] [address]  Phone [phone] Fax [phone]

## 2014-01-26 ENCOUNTER — Telehealth: Payer: Self-pay | Admitting: *Deleted

## 2014-01-26 NOTE — Telephone Encounter (Signed)
Pt calling again for MR.  Came in and signed release 2 wks ago.  Has not received note back.  Fax # [phone] Attention: Misty.  Her # is [phone]

## 2014-02-11 ENCOUNTER — Telehealth: Payer: Self-pay | Admitting: *Deleted

## 2014-02-11 NOTE — Telephone Encounter (Signed)
Record fax on 02/11/14 to [facility].

## 2014-03-23 ENCOUNTER — Encounter: Payer: Self-pay | Admitting: Neurology

## 2014-03-23 ENCOUNTER — Ambulatory Visit (INDEPENDENT_AMBULATORY_CARE_PROVIDER_SITE_OTHER): Payer: BC Managed Care – PPO | Admitting: Neurology

## 2014-03-23 VITALS — BP 130/82 | HR 101 | Resp 18 | Ht 65.0 in | Wt 227.0 lb

## 2014-03-23 DIAGNOSIS — G40009 Localization-related (focal) (partial) idiopathic epilepsy and epileptic syndromes with seizures of localized onset, not intractable, without status epilepticus: Secondary | ICD-10-CM | POA: Insufficient documentation

## 2014-03-23 MED ORDER — LAMOTRIGINE ER 25 MG PO TB24
ORAL_TABLET | ORAL | Status: DC
Start: 2014-03-23 — End: 2014-04-27

## 2014-03-23 NOTE — Progress Notes (Signed)
NEUROLOGY CONSULTATION NOTE  Melissa Pennington MRN: [id number] DOB: 05/14/1986  Referring provider: Dr. Farris Has Primary care provider: Dr. Farris Has  Reason for consult:  Seizures, side effects to medication  Dear Dr Kateri Plummer:  Thank you for your kind referral of Melissa Pennington for consultation of the above symptoms. Although her history is well known to you, please allow me to reiterate it for the purpose of our medical record. Records and images were personally reviewed where available.  HISTORY OF PRESENT ILLNESS: This is a very pleasant 28 year old right-handed woman with a history of ADD, PCOS, anxiety, in her usual state of health until 12/21/2013 when she noticed blood on her shirt and knuckles. Her last recollection was drawing sharks on her computer, then she looked down and saw her hand was still on the computer mouse but there were abrasions and blood on her knuckles. She saw blood on her shirt, she had bitten the left side of her tongue, and she spots of blood on the walls around her (she brings pictures of these on her cellphone). She denied any prior warning symptoms, stating she was feeling focused because she took her Adderall that morning, no alcohol or sleep deprivation. She called her mother and recalls feeling dazed, but drove herself to the ER. In the ER, her CBC showed a WBC of 14.4, otherwise normal. CMP normal. EtOH level and urine pregnancy test negative. Urine drug screen was positive for benzodiazepines, amphetamines, and THC.  Head CT unremarkable. She saw neurologist Dr. Hosie Poisson a few days later, MRI brain with and without contrast done which I personally reviewed was normal, symmetric hippocampi with no abnormal signal or enhancement. She had a routine EEG which was reported as minimally abnormal due to very subtle theta slowing over the left hemisphere, no epileptiform discharges seen.  She was started on Keppra 500mg  BID in August. Since then, she has felt tired, drunk  all the time, depressed, suicidal, mean, and angry.  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 She needs too re-read pages a couple of times. Her mother has told her she is "spacey and mean." She notes occasional gaps in time, particularly when reading.    Around 2 weeks after starting the medication, she stopped the medication because she had a paper to write and felt it was making her unable to focus even with the Adderall.  After skipping 2 doses, she woke up at 5am feeling weird, with low amplitude shaking of all her extremities.  She restarted the medication until the second week of September (around 09/14) again to complete a paper, she skipped a few doses then woke up in the middle of the night with urinary incontinence and tongue bite. She did not feel as weird as the first time.  She was sleep deprived both times.  She denies any olfactory/gustatory hallucinations, deja vu, rising epigastric sensation, focal numbness/tingling/weakness, myoclonic jerks. Over the past few months, she has had headaches over the left temporal region, with pressure-like pain occurring around once a week, lasting a few hours until she takes Tylenol. There is associated photophobia, no nausea/vomiting. She denies any prior history of headaches. Her mother has headaches. She has diarrhea "all the time," and feels her vision is frequently blurred. She denies any diplopia, dysarthria, dysphagia, neck/back pain. She has been on Aldactone and Metformin since 11/2013 for PCOS, and on birth control since 07/2013.  She has been taking the same dose of Adderall since 2011. She  has a history of anxiety and depression, and was on Prozac at the time of the seizure. She stopped it a month ago because she felt it was not working. She was prescribed Xanax for anxiety but stopped it as well.    Epilepsy Risk Factors:  She had a normal birth and early development.  There is no history of febrile convulsions, CNS  infections such as meningitis/encephalitis, significant traumatic brain injury, neurosurgical procedures, or family history of seizures.  PAST MEDICAL HISTORY: Past Medical History  Diagnosis Date  . PCOS (polycystic ovarian syndrome)   . Anxiety   . Depression   . ADHD (attention deficit hyperactivity disorder)     PAST SURGICAL HISTORY: Past Surgical History  Procedure Laterality Date  . Breast reduction surgery    . Tonsillectomy      MEDICATIONS: Current Outpatient Prescriptions on File Prior to Visit  Medication Sig Dispense Refill  . levETIRAcetam (KEPPRA) 500 MG tablet Take 1 tablet (500 mg total) by mouth every 12 (twelve) hours.  60 tablet  6  . metFORMIN (GLUCOPHAGE) 500 MG tablet Take 500 mg by mouth 3 (three) times daily.      Suzzanne Cloud. Norgestimate-Eth Estradiol (SPRINTEC 28 PO) Take 1 tablet by mouth daily.      Marland Kitchen. spironolactone (ALDACTONE) 50 MG tablet Take 50 mg by mouth daily.      Marland Kitchen. amphetamine-dextroamphetamine (ADDERALL) 20 MG tablet Take 20 mg by mouth 2 (two) times daily.       No current facility-administered medications on file prior to visit.    ALLERGIES: No Known Allergies  FAMILY HISTORY: No family history on file.  SOCIAL HISTORY: History   Social History  . Marital Status: Single    Spouse Name: N/A    Number of Children: 0  . Years of Education: 12+   Occupational History  . Student     Social History Main Topics  . Smoking status: Former Games developermoker  . Smokeless tobacco: Never Used  . Alcohol Use: No     Comment: socially  . Drug Use: Yes    Special: Marijuana  . Sexual Activity: Yes    Birth Control/ Protection: Pill   Other Topics Concern  . Not on file   Social History Narrative   Patient lives at home alone.    Patient has no children.    Patient is single.    Patient works at [employer]   Patient is a Archivistcollege student.           REVIEW OF SYSTEMS: Constitutional: No fevers, chills, or sweats, no generalized fatigue,  change in appetite Eyes: No visual changes, double vision, eye pain Ear, nose and throat: No hearing loss, ear pain, nasal congestion, sore throat Cardiovascular: No chest pain, palpitations Respiratory:  No shortness of breath at rest or with exertion, wheezes GastrointestinaI: No nausea, vomiting, diarrhea, abdominal pain, fecal incontinence Genitourinary:  No dysuria, urinary retention or frequency Musculoskeletal:  No neck pain, back pain Integumentary: No rash, pruritus, skin lesions Neurological: as above Psychiatric: + depression, insomnia, anxiety Endocrine: No palpitations, fatigue, diaphoresis, mood swings, change in appetite, change in weight, increased thirst Hematologic/Lymphatic:  No anemia, purpura, petechiae. Allergic/Immunologic: no itchy/runny eyes, nasal congestion, recent allergic reactions, rashes  PHYSICAL EXAM: Filed Vitals:   03/23/14 0829  BP: 130/82  Pulse: 101  Resp: 18   General: No acute distress Head:  Normocephalic/atraumatic Eyes: Fundoscopic exam shows bilateral sharp discs, no vessel changes, exudates, or hemorrhages Neck: supple, no paraspinal tenderness,  full range of motion Back: No paraspinal tenderness Heart: regular rate and rhythm Lungs: Clear to auscultation bilaterally. Vascular: No carotid bruits. Skin/Extremities: No rash, no edema Neurological Exam: Mental status: alert and oriented to person, place, and time, no dysarthria or aphasia, Fund of knowledge is appropriate.  Recent and remote memory are intact.  Attention and concentration are normal.    Able to name objects and repeat phrases. Cranial nerves: CN I: not tested CN II: pupils equal, round and reactive to light, visual fields intact, fundi unremarkable. CN III, IV, VI:  full range of motion, no nystagmus, no ptosis CN V: facial sensation intact CN VII: upper and lower face symmetric CN VIII: hearing intact to finger rub CN IX, X: gag intact, uvula midline CN XI:  sternocleidomastoid and trapezius muscles intact CN XII: tongue midline Bulk & Tone: normal, no fasciculations. Motor: 5/5 throughout with no pronator drift. Sensation: intact to light touch, cold, pin, vibration and joint position sense.  No extinction to double simultaneous stimulation.  Romberg test negative Deep Tendon Reflexes: +2 throughout, no ankle clonus Plantar responses: downgoing bilaterally Cerebellar: no incoordination on finger to nose, heel to shin. No dysdiadochokinesia Gait: narrow-based and steady, able to tandem walk adequately. Tremor: none  IMPRESSION: This is a 28 year old right-handed woman with a history of PCOS, ADD, depression, and anxiety, presenting with an unwitnessed episode of loss of time, where she woke up with abrasions over both knuckles and blood around her, concerning for seizure.  She started Keppra and had two episodes suggestive of unwitnessed seizures when she stopped the medication. MRI brain normal, routine EEG reported subtle left hemisphere slowing. She is now back on Keppra 500mg  BID with no further episodes in the past month, however she is having significant side effects on this.  She will benefit from switching to a different medication, in women of childbearing age, Lamictal is recommended. This may help with mood stabilization as well. She will start on low dose Lamictal ER with uptitration schedule, side effects including Levonne SpillerStevens Johnson syndrome were discussed. Once dose is therapeutic, we will plan to taper off Keppra.  We discussed avoidance of seizure triggers and [place] driving laws indicating that one should stop driving after a seizure, until 6 months seizure-free. She will follow-up in 5-6 weeks.  Thank you for allowing me to participate in the care of this patient. Please do not hesitate to call for any questions or concerns.   Patrcia DollyKaren Chevelle Durr, M.D.  CC: Dr. Kateri PlummerMorrow

## 2014-03-23 NOTE — Patient Instructions (Addendum)
1. Start Lamotrigine ER 25mg : Take 1 tablet daily for 2 weeks, then increase to 2 tablets daily for 2 weeks, then increase to 4 tablets daily and continue 2. Continue Keppra 500mg  twice a day for now 3. Follow-up in 5 weeks  Seizure Precautions: 1. If medication has been prescribed for you to prevent seizures, take it exactly as directed.  Do not stop taking the medicine without talking to your doctor first, even if you have not had a seizure in a long time.   2. Avoid activities in which a seizure would cause danger to yourself or to others.  Don't operate dangerous machinery, swim alone, or climb in high or dangerous places, such as on ladders, roofs, or girders.  Do not drive unless your doctor says you may.  3. If you have any warning that you may have a seizure, lay down in a safe place where you can't hurt yourself.    4.  No driving for 6 months from last seizure, as per [facility] [place].   Please refer to the following link on the Epilepsy Foundation of America's website for more information: http://www.epilepsyfoundation.org/answerplace/Social/driving/drivingu.cfm   5.  Maintain good sleep hygiene.  6.  Notify your neurology if you are planning pregnancy or if you become pregnant.  7.  Contact your doctor if you have any problems that may be related to the medicine you are taking.  8.  Call 911 and bring the patient back to the ED if:        A.  The seizure lasts longer than 5 minutes.       B.  The patient doesn't awaken shortly after the seizure  C.  The patient has new problems such as difficulty seeing, speaking or moving  D.  The patient was injured during the seizure  E.  The patient has a temperature over 102 F (39C)  F.  The patient vomited and now is having trouble breathing

## 2014-04-15 ENCOUNTER — Telehealth: Payer: Self-pay | Admitting: Neurology

## 2014-04-15 NOTE — Telephone Encounter (Signed)
Pt called stating that she had a seizure last night. Pt feels really dizzy today. Please call pt # [phone]

## 2014-04-19 ENCOUNTER — Telehealth: Payer: Self-pay | Admitting: Neurology

## 2014-04-19 NOTE — Telephone Encounter (Signed)
Called patient regarding rescheduling 05/18/14 appointment per Larita FifeLynn leaving, patient states that she has found a new doctor and does not wish to reschedule.

## 2014-04-19 NOTE — Telephone Encounter (Signed)
Lmom to return my call. 

## 2014-04-19 NOTE — Telephone Encounter (Signed)
Spoke with patient. Wanted to see how she was feeling since seizure last week. She states that she is doing better. She states that she did bite her toungue really hard this time. She states that she has been using something called "Aloe Plant" which is helping she states she is able to chew better. She does have a follow-up appt next week. Did advise to call if any changes or issues before then.

## 2014-04-27 ENCOUNTER — Encounter: Payer: Self-pay | Admitting: Neurology

## 2014-04-27 ENCOUNTER — Ambulatory Visit (INDEPENDENT_AMBULATORY_CARE_PROVIDER_SITE_OTHER): Payer: BC Managed Care – PPO | Admitting: Neurology

## 2014-04-27 VITALS — BP 110/80 | HR 104 | Resp 18 | Ht 65.0 in | Wt 226.0 lb

## 2014-04-27 DIAGNOSIS — G40009 Localization-related (focal) (partial) idiopathic epilepsy and epileptic syndromes with seizures of localized onset, not intractable, without status epilepticus: Secondary | ICD-10-CM

## 2014-04-27 MED ORDER — LAMOTRIGINE ER 100 MG PO TB24: ORAL_TABLET | ORAL | Status: DC

## 2014-04-27 NOTE — Progress Notes (Signed)
NEUROLOGY FOLLOW UP OFFICE NOTE  Melissa Pennington [id number]  HISTORY OF PRESENT ILLNESS: I had the pleasure of seeing Melissa Pennington in follow-up in the neurology clinic on 04/27/2014.  The patient was last seen a month ago for new onset seizures since July 2015. These have been unwitnessed but she has woken up with injuries. She was started on Keppra in the hospital but had significant side effects and was started on Lamictal on her last visit. She forgot the instructions and had only been taking Lamictal XR 25mg  daily until she had another unwitnessed seizure on 04/14/14. She was sitting outside her apartment then all of a sudden she noted that her Ugg boot was 5 feet away from her, she felt weird, and had bitten both sides of her tongue. She brings a picture of the tongue bite with significant bite on the right lateral side. She had hit her head on the iron fence but when she came to she was in a sitting position. She denied any sleep deprivation or alcohol intake. She had been taking Keppra IR 500mg  2 tabs qhs and 1 tablet of Lamictal. She called our office to report the seizure, then realized she did not increase dose as instructed and went from 1 tablet to 4 tablets 2 weeks ago. She denies any rash, no dizziness, diplopia, or gait instability on higher dose Lamictal. She had discontinued Adderall and felt briefly dizzy and unwell when she restarted taking 1/2 tablet, but now feels fine with this dose. She continues to have difficulties focusing in school, she has to read a chapter several times, and it takes her 2-3 times longer to do book reports. She denies any olfactory/gustatory hallucinations, deja vu, rising epigastric sensation, focal numbness/tingling/weakness, myoclonic jerks.  HPI: This is a very pleasant 28 yo RH woman with a history of ADD, PCOS, anxiety, in her usual state of health until 12/21/2013 when she noticed blood on her shirt and knuckles. Her last recollection was drawing sharks on  her computer, then she looked down and saw her hand was still on the computer mouse but there were abrasions and blood on her knuckles. She saw blood on her shirt, she had bitten the left side of her tongue, and she spots of blood on the walls around her (she brings pictures of these on her cellphone). She denied any prior warning symptoms, stating she was feeling focused because she took her Adderall that morning, no alcohol or sleep deprivation. She called her mother and recalls feeling dazed, but drove herself to the ER. In the ER, her CBC showed a WBC of 14.4, otherwise normal. CMP normal. EtOH level and urine pregnancy test negative. Urine drug screen was positive for benzodiazepines, amphetamines, and THC. Head CT unremarkable. She was started on Keppra 500mg  BID in August. Since then, she has felt tired, drunk all the time, depressed, suicidal, mean, and angry. 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 She needs too re-read pages a couple of times. Her mother has told her she is "spacey and mean." She notes occasional gaps in time, particularly when reading.   Around 2 weeks after starting the medication, she stopped the medication because she had a paper to write and felt it was making her unable to focus even with the Adderall. After skipping 2 doses, she woke up at 5am feeling weird, with low amplitude shaking of all her extremities. She restarted the medication until the second week of September (around 09/14) again to  complete a paper, she skipped a few doses then woke up in the middle of the night with urinary incontinence and tongue bite. She did not feel as weird as the first time. She was sleep deprived both times.  Epilepsy Risk Factors: She had a normal birth and early development. There is no history of febrile convulsions, CNS infections such as meningitis/encephalitis, significant traumatic brain injury, neurosurgical procedures, or family history of  seizures.  Diagnostic Data: MRI brain with and without contrast done which I personally reviewed was normal, symmetric hippocampi with no abnormal signal or enhancement. She had a routine EEG which was reported as minimally abnormal due to very subtle theta slowing over the left hemisphere, no epileptiform discharges seen.  PAST MEDICAL HISTORY: Past Medical History  Diagnosis Date  . PCOS (polycystic ovarian syndrome)   . Anxiety   . Depression   . ADHD (attention deficit hyperactivity disorder)     MEDICATIONS: Current Outpatient Prescriptions on File Prior to Visit  Medication Sig Dispense Refill  . amphetamine-dextroamphetamine (ADDERALL) 20 MG tablet Take 20 mg by mouth 2 (two) times daily.    Marland Kitchen. levETIRAcetam (KEPPRA) 500 MG tablet Take 1 tablet (500 mg total) by mouth every 12 (twelve) hours. 60 tablet 6  . metFORMIN (GLUCOPHAGE) 500 MG tablet Take 500 mg by mouth 3 (three) times daily.    Suzzanne Cloud. Norgestimate-Eth Estradiol (SPRINTEC 28 PO) Take 1 tablet by mouth daily.    Marland Kitchen. spironolactone (ALDACTONE) 50 MG tablet Take 50 mg by mouth daily.    Keppra 500mg  2 tabs qhs Lamictal XR 25mg  4 tabs daily No current facility-administered medications on file prior to visit.    ALLERGIES: No Known Allergies  FAMILY HISTORY: No family history on file.  SOCIAL HISTORY: History   Social History  . Marital Status: Single    Spouse Name: N/A    Number of Children: 0  . Years of Education: 12+   Occupational History  . Student     Social History Main Topics  . Smoking status: Former Games developermoker  . Smokeless tobacco: Never Used  . Alcohol Use: No  . 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 Sexual Activity: Yes    Birth Control/ Protection: Pill   Other Topics Concern  . Not on file   Social History Narrative   Patient lives at home alone.    Patient has no children.    Patient is single.    Patient works at [employer]   Patient is a Archivistcollege student.            REVIEW OF SYSTEMS: Constitutional: No fevers, chills, or sweats, no generalized fatigue, change in appetite Eyes: No visual changes, double vision, eye pain Ear, nose and throat: No hearing loss, ear pain, nasal congestion, sore throat Cardiovascular: No chest pain, palpitations Respiratory:  No shortness of breath at rest or with exertion, wheezes GastrointestinaI: No nausea, vomiting, diarrhea, abdominal pain, fecal incontinence Genitourinary:  No dysuria, urinary retention or frequency Musculoskeletal:  No neck pain, back pain Integumentary: No rash, pruritus, skin lesions Neurological: as above Psychiatric: No depression, insomnia, anxiety Endocrine: No palpitations, fatigue, diaphoresis, mood swings, change in appetite, change in weight, increased thirst Hematologic/Lymphatic:  No anemia, purpura, petechiae. Allergic/Immunologic: no itchy/runny eyes, nasal congestion, recent allergic reactions, rashes  PHYSICAL EXAM: Filed Vitals:   04/27/14 0953  BP: 110/80  Pulse: 104  Resp: 18   General: No acute distress Head:  Normocephalic/atraumatic. Tongue healing well  Neck: supple, no paraspinal tenderness, full range of motion Heart:  Regular rate and rhythm Lungs:  Clear to auscultation bilaterally Back: No paraspinal tenderness Skin/Extremities: No rash, no edema Neurological Exam: alert and oriented to person, place, and time. No aphasia or dysarthria. Fund of knowledge is appropriate.  Recent and remote memory are intact.  Attention and concentration are normal.    Able to name objects and repeat phrases. Cranial nerves: Pupils equal, round, reactive to light.  Fundoscopic exam unremarkable, no papilledema. Extraocular movements intact with no nystagmus. Visual fields full. Facial sensation intact. No facial asymmetry. Tongue, uvula, palate midline.  Motor: Bulk and tone normal, muscle strength 5/5 throughout with no pronator drift.  Sensation to light touch intact.  No  extinction to double simultaneous stimulation.  Deep tendon reflexes 2+ throughout, toes downgoing.  Finger to nose testing intact.  Gait narrow-based and steady, able to tandem walk adequately.  Romberg negative.  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  MRI brain normal, routine EEG reported subtle left hemisphere slowing. She was having side effects on Keppra and started Lamictal XR, but increased dose rapidly 2 weeks ago. No rash, she is aware of risk for [employer] syndrome and will continue to monitor symptoms. She had another unwitnessed seizure last 04/14/14. She will continue with uptitration of Lamictal XR to 200mg /day. She will start tapering Keppra off after 2 weeks of higher dose Lamictal. She continues to report cognitive difficulties, a 24-hour EEG will be ordered to assess for subclinical seizures causing memory problems as well as for further seizure classification. We again discussed avoidance of seizure triggers and the importance of medication compliance. We again discussed [place] driving laws indicating that one should stop driving after a seizure, until 6 months seizure-free. She will follow-up in   Thank you for allowing me to participate in her care.  Please do not hesitate to call for any questions or concerns.  The duration of this appointment visit was 15 minutes of face-to-face time with the patient.  Greater than 50% of this time was spent in counseling, explanation of diagnosis, planning of further management, and coordination of care.   Patrcia Dolly, M.D.   CC: Dr. Kateri Plummer

## 2014-04-27 NOTE — Patient Instructions (Signed)
1. Starting Nov 25, increase Lamictal XR to 200mg  a day. Your new prescription will be 100mg  tablets, take 2 tablets daily 2. Starting Dec 9, reduce Keppra 500mg  to 1 tablet daily. Starting Dec 16, reduce Keppra to 1 tablet every other day, then stop medication on Dec 23. 3. Schedule 24-hour EEG 4. As per [place] driving laws, no driving until 6 months seizure-free  Seizure Precautions: 1. If medication has been prescribed for you to prevent seizures, take it exactly as directed.  Do not stop taking the medicine without talking to your doctor first, even if you have not had a seizure in a long time.   2. Avoid activities in which a seizure would cause danger to yourself or to others.  Don't operate dangerous machinery, swim alone, or climb in high or dangerous places, such as on ladders, roofs, or girders.  Do not drive unless your doctor says you may.  3. If you have any warning that you may have a seizure, lay down in a safe place where you can't hurt yourself.    4.  No driving for 6 months from last seizure, as per [place] [facility] [place].   Please refer to the following link on the Epilepsy Foundation of America's website for more information: http://www.epilepsyfoundation.org/answerplace/Social/driving/drivingu.cfm   5.  Maintain good sleep hygiene.  6.  Notify your neurology if you are planning pregnancy or if you become pregnant.  7.  Contact your doctor if you have any problems that may be related to the medicine you are taking.  8.  Call 911 and bring the patient back to the ED if:        A.  The seizure lasts longer than 5 minutes.       B.  The patient doesn't awaken shortly after the seizure  C.  The patient has new problems such as difficulty seeing, speaking or moving  D.  The patient was injured during the seizure  E.  The patient has a temperature over 102 F (39C)  F.  The patient vomited and now is having trouble breathing

## 2014-04-28 ENCOUNTER — Encounter: Payer: Self-pay | Admitting: Neurology

## 2014-05-18 ENCOUNTER — Ambulatory Visit: Payer: Self-pay | Admitting: Nurse Practitioner

## 2014-05-21 NOTE — Telephone Encounter (Signed)
See message on 02/11/14 Faxed

## 2014-06-23 ENCOUNTER — Ambulatory Visit (INDEPENDENT_AMBULATORY_CARE_PROVIDER_SITE_OTHER): Payer: BLUE CROSS/BLUE SHIELD | Admitting: Neurology

## 2014-06-23 DIAGNOSIS — G40009 Localization-related (focal) (partial) idiopathic epilepsy and epileptic syndromes with seizures of localized onset, not intractable, without status epilepticus: Secondary | ICD-10-CM

## 2014-07-02 ENCOUNTER — Telehealth: Payer: Self-pay | Admitting: Neurology

## 2014-07-02 DIAGNOSIS — G40009 Localization-related (focal) (partial) idiopathic epilepsy and epileptic syndromes with seizures of localized onset, not intractable, without status epilepticus: Secondary | ICD-10-CM

## 2014-07-02 MED ORDER — LAMOTRIGINE ER 100 MG PO TB24: ORAL_TABLET | ORAL | Status: AC

## 2014-07-02 NOTE — Telephone Encounter (Signed)
Discussed EEG results with Tresa EndoKelly. She has been doing much better off Keppra, tolerating lamictal XR 200mg /day with no side effects. She denies any symptoms during the EEG. She will increase Lamictal XR to 300mg /day.

## 2014-07-02 NOTE — Telephone Encounter (Signed)
Left VM to discuss EEG results. EEG had shown rare generalized discharges, also a 14 sec electrographic seizure from the left frontocentral region. Would like to increase Lamictal XR to 300mg  daily.

## 2014-07-20 NOTE — Procedures (Signed)
ELECTROENCEPHALOGRAM REPORT  Dates of Recording: 06/23/2014 to 06/24/2014  Patient's Name: Melissa Pennington Mostafa MRN: [id number] Date of Birth: [birth year]  Referring Provider: Dr. Patrcia DollyKaren Revia Nghiem  Procedure: 24-hour ambulatory EEG  History: This is a 29 year old woman with recurrent episodes of loss of time. EEG for classification  Medications: Lamictal  Technical Summary: This is a 24-hour multichannel digital EEG recording measured by the international 10-20 system with electrodes applied with paste and impedances below 5000 ohms performed as portable with EKG monitoring.  The digital EEG was referentially recorded, reformatted, and digitally filtered in a variety of bipolar and referential montages for optimal display.    DESCRIPTION OF RECORDING: During maximal wakefulness, the background activity consisted of a symmetric 10.5 Hz posterior dominant rhythm which was reactive to eye opening. There is occasional focal 5 Hz theta slowing seen over the left temporal region, at times sharply contoured. There were no epileptiform discharges seen in wakefulness.  During the recording, the patient progresses through wakefulness, drowsiness, and Stage 2 sleep.  Similar occasional focal slowing is seen over the left temporal region, at times sharply contoured. There were 2 bursts of generalized irregular polyspike discharges seen in sleep with shifting asymmetry.   Events: On 01/21 at 0000 hours, she reports a headache. Electrographically, there were no EEG or EKG changes seen.  On 01/21 at 0929 hours, there is an electrographic seizure seen over the left frontocentral region. Patient does not report any symptoms. Electrographically, rhythmic 5-6 Hz sharp waves are seen with phase reversal over F3 lasting 14 seconds, followed by low voltage 14 Hz activity for another 5 seconds.  EKG lead was unremarkable.  IMPRESSION: This 24-hour ambulatory EEG study is abnormal due to the presence of: 1. Occasional  focal slowing over the left temporal region 2. Rare generalized polyspike discharges seen during sleep 3. Electrographic seizure arising from the left frontocentral region  CLINICAL CORRELATION of the above findings indicates definite epileptogenic potential over the left frontocentral region. Generalized polyspike discharges are suggestive of a generalized epilepsy, however secondary bisynchrony is also a possibility. Focal slowing over the left temporal region indicates focal cerebral dysfunction in this region suggestive of underlying structural or physiologic abnormality.    Patrcia DollyKaren Reilley Latorre, M.D.

## 2014-07-30 ENCOUNTER — Ambulatory Visit (INDEPENDENT_AMBULATORY_CARE_PROVIDER_SITE_OTHER): Payer: BLUE CROSS/BLUE SHIELD | Admitting: Neurology

## 2014-07-30 ENCOUNTER — Encounter: Payer: Self-pay | Admitting: Neurology

## 2014-07-30 VITALS — BP 90/60 | HR 92 | Ht 65.0 in | Wt 223.0 lb

## 2014-07-30 DIAGNOSIS — G40009 Localization-related (focal) (partial) idiopathic epilepsy and epileptic syndromes with seizures of localized onset, not intractable, without status epilepticus: Secondary | ICD-10-CM

## 2014-07-30 NOTE — Progress Notes (Signed)
NEUROLOGY FOLLOW UP OFFICE NOTE  Chauna Osoria [id number]  HISTORY OF PRESENT ILLNESS: I had the pleasure of seeing Keilana Morlock in follow-up in the neurology clinic on 07/30/2014.  The patient was last seen 3 months ago for new onset seizures since July 2015. Since her last visit, she had a 24-hour EEG which was abnormal, with occasional focal slowing over the left temporal region, rare generalized polyspike discharges seen during sleep, and an electrographic seizure arising from the left frontocentral region lasting 19 seconds. Patient did not report any symptoms during this time. She was instructed to increase Lamictal XR to  daily. She has been tolerating this with no side effects. She denies any further seizures or since November 2015. She occasionally feels like she is blanking out or missing a bit of a TV show. Her memory is "terrible." She denies any headaches, dizziness, diplopia, gait instability. She does not drink. She reports being under a lot of stress. She has "pain all over," with tightness at night, keeping her from falling asleep. Sometimes she is up until 5am.   She continues to have difficulties focusing in school, she has to read a chapter several times, and it takes her 2-3 times longer to do book reports. She denies any olfactory/gustatory hallucinations, deja vu, rising epigastric sensation, focal numbness/tingling/weakness, myoclonic jerks.  HPI: This is a very pleasant 29 yo RH woman with a history of ADD, PCOS, anxiety, in her usual state of health until 12/21/2013 when she noticed blood on her shirt and knuckles. Her last recollection was drawing sharks on her computer, then she looked down and saw her hand was still on the computer mouse but there were abrasions and blood on her knuckles. She saw blood on her shirt, she had bitten the left side of her tongue, and she spots of blood on the walls around her (she brings pictures of these on her cellphone). She denied any  prior warning symptoms, stating she was feeling focused because she took her Adderall that morning, no alcohol or sleep deprivation. She called her mother and recalls feeling dazed, but drove herself to the ER. In the ER, her CBC showed a WBC of 14.4, otherwise normal. CMP normal. EtOH level and urine pregnancy test negative. Urine drug screen was positive for benzodiazepines, amphetamines, and THC. Head CT unremarkable. She was started on Keppra  BID in August. She had side effects of feeling drunk, depressed, suicidal, and angry, which resolved with discontinuation of Keppra. Around 2 weeks after starting the medication, she stopped the medication because she had a paper to write and felt it was making her unable to focus even with the Adderall. After skipping 2 doses, she woke up at 5am feeling weird, with low amplitude shaking of all her extremities. She was sleep deprived both times. While on low dose Lamictal, she had another unwitnessed seizure on 04/14/14. She was sitting outside her apartment then all of a sudden she noted that her boot was 5 feet away from her, she felt weird, and had bitten both sides of her tongue. She brings a picture of the tongue bite with significant bite on the right lateral side. She had hit her head on the iron fence but when she came to she was in a sitting position. She denied any sleep deprivation or alcohol intake. She had been taking Keppra IR  2 tabs qhs and 1 tablet of Lamictal. She increased Lamictal.   Epilepsy Risk Factors: She had a normal birth and  early development. There is no history of febrile convulsions, CNS infections such as meningitis/encephalitis, significant traumatic brain injury, neurosurgical procedures, or family history of seizures.  Diagnostic Data: MRI brain with and without contrast done which I personally reviewed was normal, symmetric hippocampi with no abnormal signal or enhancement. She had a routine EEG which was reported as  minimally abnormal due to very subtle theta slowing over the left hemisphere, no epileptiform discharges seen. 24-hour EEG which was abnormal, with occasional focal slowing over the left temporal region, rare generalized polyspike discharges seen during sleep, and an electrographic seizure arising from the left frontocentral region lasting 19 seconds.   PAST MEDICAL HISTORY: Past Medical History  Diagnosis Date  . PCOS (polycystic ovarian syndrome)   . Anxiety   . Depression   . ADHD (attention deficit hyperactivity disorder)     MEDICATIONS: Current Outpatient Prescriptions on File Prior to Visit  Medication Sig Dispense Refill  . ALPRAZolam (XANAX) 0.5 MG tablet Take 0.5 mg by mouth 3 (three) times daily as needed.  2  . amphetamine-dextroamphetamine (ADDERALL) 20 MG tablet Take 20 mg by mouth 2 (two) times daily.    . LamoTRIgine 100 MG TB24 Take 3 tablets daily 90 tablet 6  . metFORMIN (GLUCOPHAGE) 500 MG tablet Take 500 mg by mouth 3 (three) times daily.    Suzzanne Cloud. Norgestimate-Eth Estradiol (SPRINTEC 28 PO) Take 1 tablet by mouth daily.    Marland Kitchen. spironolactone (ALDACTONE) 50 MG tablet Take 50 mg by mouth daily.     No current facility-administered medications on file prior to visit.    ALLERGIES: No Known Allergies  FAMILY HISTORY: History reviewed. No pertinent family history.  SOCIAL HISTORY: History   Social History  . Marital Status: Single    Spouse Name: N/A  . Number of Children: 0  . Years of Education: 12+   Occupational History  . Student     Social History Main Topics  . Smoking status: Former Games developermoker  . Smokeless tobacco: Never Used  . Alcohol Use: No  . 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 Sexual Activity: Yes    Birth Control/ Protection: Pill   Other Topics Concern  . Not on file   Social History Narrative   Patient lives at home alone.    Patient has no children.    Patient is single.    Patient works at [employer]   Patient  is a Archivistcollege student.           REVIEW OF SYSTEMS: Constitutional: No fevers, chills, or sweats, no generalized fatigue, change in appetite Eyes: No visual changes, double vision, eye pain Ear, nose and throat: No hearing loss, ear pain, nasal congestion, sore throat Cardiovascular: No chest pain, palpitations Respiratory:  No shortness of breath at rest or with exertion, wheezes GastrointestinaI: No nausea, vomiting, diarrhea, abdominal pain, fecal incontinence Genitourinary:  No dysuria, urinary retention or frequency Musculoskeletal:  No neck pain, back pain Integumentary: No rash, pruritus, skin lesions Neurological: as above Psychiatric: No depression, insomnia, anxiety Endocrine: No palpitations, fatigue, diaphoresis, mood swings, change in appetite, change in weight, increased thirst Hematologic/Lymphatic:  No anemia, purpura, petechiae. Allergic/Immunologic: no itchy/runny eyes, nasal congestion, recent allergic reactions, rashes  PHYSICAL EXAM: Filed Vitals:   07/30/14 0807  BP: 90/60  Pulse: 92   General: No acute distress Head:  Normocephalic/atraumatic Neck: supple, no paraspinal tenderness, full range of motion Heart:  Regular rate and rhythm Lungs:  Clear to  auscultation bilaterally Back: No paraspinal tenderness Skin/Extremities: No rash, no edema Neurological Exam: alert and oriented to person, place, and time. No aphasia or dysarthria. Fund of knowledge is appropriate.  Recent and remote memory are intact.  Attention and concentration are normal.    Able to name objects and repeat phrases. Cranial nerves: Pupils equal, round, reactive to light.  Fundoscopic exam unremarkable, no papilledema. Extraocular movements intact with no nystagmus. Visual fields full. Facial sensation intact. No facial asymmetry. Tongue, uvula, palate midline.  Motor: Bulk and tone normal, muscle strength 5/5 throughout with no pronator drift.  Sensation to light touch intact.  No extinction  to double simultaneous stimulation.  Deep tendon reflexes 2+ throughout, toes downgoing.  Finger to nose testing intact.  Gait narrow-based and steady, able to tandem walk adequately.  Romberg negative.  IMPRESSION: This is a very pleasant 29 yo RH woman with a history of PCOS, ADD, depression, and anxiety, who has had unwitnessed episodes of loss of time, where she regains consciousness with injuries, concerning for unwitnessed seizures. MRI brain normal, her 24-hour EEG showed left temporal slowing, rare generalized polyspike discharges, and an electrographic seizure arising focally from the left frontocentral region. She is now on Lamictal /day with no further similar episodes except for possible brief blanking out when watching TV. She also has memory issues.  She will continue Lamictal XR /day for now, check Lamictal level. We may plan to increase this further. She is aware of [place] driving laws that one should stop driving after a seizure, until 6 months seizure-free. She will follow-up in 3 months.   Thank you for allowing me to participate in her care.  Please do not hesitate to call for any questions or concerns.  The duration of this appointment visit was 15 minutes of face-to-face time with the patient.  Greater than 50% of this time was spent in counseling, explanation of diagnosis, planning of further management, and coordination of care.   Patrcia Dolly, M.D.   CC: Dr. Kateri Plummer

## 2014-07-30 NOTE — Patient Instructions (Signed)
1. Bloodwork for Lamictal level 2. Continue Lamictal XR 100mg : Take 3 tablet daily 3. As per [place] driving laws, one should not drive until 6 months seizure-free  Seizure Precautions: 1. If medication has been prescribed for you to prevent seizures, take it exactly as directed.  Do not stop taking the medicine without talking to your doctor first, even if you have not had a seizure in a long time.   2. Avoid activities in which a seizure would cause danger to yourself or to others.  Don't operate dangerous machinery, swim alone, or climb in high or dangerous places, such as on ladders, roofs, or girders.  Do not drive unless your doctor says you may.  3. If you have any warning that you may have a seizure, lay down in a safe place where you can't hurt yourself.    4.  No driving for 6 months from last seizure, as per [facility]  [place] law.   Please refer to the following link on the Epilepsy Foundation of America's website for more information: http://www.epilepsyfoundation.org/answerplace/Social/driving/drivingu.cfm   5.  Maintain good sleep hygiene.  6.  Notify your neurology if you are planning pregnancy or if you become pregnant.  7.  Contact your doctor if you have any problems that may be related to the medicine you are taking.  8.  Call 911 and bring the patient back to the ED if:        A.  The seizure lasts longer than 5 minutes.       B.  The patient doesn't awaken shortly after the seizure  C.  The patient has new problems such as difficulty seeing, speaking or moving  D.  The patient was injured during the seizure  E.  The patient has a temperature over 102 F (39C)  F.  The patient vomited and now is having trouble breathing

## 2014-09-12 ENCOUNTER — Emergency Department: Payer: BC Managed Care – PPO

## 2014-09-12 ENCOUNTER — Observation Stay
Admission: EM | Admit: 2014-09-12 | Discharge: 2014-09-13 | DRG: 101 | Disposition: A | Discharge status: Home / Self Care | Payer: BC Managed Care – PPO | Attending: Internal Medicine | Admitting: Internal Medicine

## 2014-09-12 ENCOUNTER — Inpatient Hospital Stay: Payer: BC Managed Care – PPO | Admitting: Internal Medicine

## 2014-09-12 DIAGNOSIS — R569 Unspecified convulsions: Secondary | ICD-10-CM | POA: Diagnosis not present

## 2014-09-12 DIAGNOSIS — Z72820 Sleep deprivation: Secondary | ICD-10-CM

## 2014-09-12 DIAGNOSIS — Z87891 Personal history of nicotine dependence: Secondary | ICD-10-CM

## 2014-09-12 DIAGNOSIS — W19XXXA Unspecified fall, initial encounter: Secondary | ICD-10-CM | POA: Diagnosis present

## 2014-09-12 DIAGNOSIS — G40409 Other generalized epilepsy and epileptic syndromes, not intractable, without status epilepticus: Principal | ICD-10-CM | POA: Diagnosis present

## 2014-09-12 DIAGNOSIS — E282 Polycystic ovarian syndrome: Secondary | ICD-10-CM | POA: Diagnosis present

## 2014-09-12 DIAGNOSIS — W01190A Fall on same level from slipping, tripping and stumbling with subsequent striking against furniture, initial encounter: Secondary | ICD-10-CM

## 2014-09-12 DIAGNOSIS — W1809XA Striking against other object with subsequent fall, initial encounter: Secondary | ICD-10-CM | POA: Diagnosis present

## 2014-09-12 DIAGNOSIS — F418 Other specified anxiety disorders: Secondary | ICD-10-CM | POA: Diagnosis not present

## 2014-09-12 DIAGNOSIS — F909 Attention-deficit hyperactivity disorder, unspecified type: Secondary | ICD-10-CM | POA: Diagnosis present

## 2014-09-12 DIAGNOSIS — R9401 Abnormal electroencephalogram [EEG]: Secondary | ICD-10-CM | POA: Diagnosis present

## 2014-09-12 DIAGNOSIS — Z2821 Immunization not carried out because of patient refusal: Secondary | ICD-10-CM

## 2014-09-12 DIAGNOSIS — S01511A Laceration without foreign body of lip, initial encounter: Secondary | ICD-10-CM

## 2014-09-12 DIAGNOSIS — F13239 Sedative, hypnotic or anxiolytic dependence with withdrawal, unspecified: Secondary | ICD-10-CM

## 2014-09-12 DIAGNOSIS — W06XXXA Fall from bed, initial encounter: Secondary | ICD-10-CM

## 2014-09-12 DIAGNOSIS — F13939 Sedative, hypnotic or anxiolytic use, unspecified with withdrawal, unspecified: Secondary | ICD-10-CM

## 2014-09-12 DIAGNOSIS — S0181XA Laceration without foreign body of other part of head, initial encounter: Secondary | ICD-10-CM | POA: Diagnosis present

## 2014-09-12 DIAGNOSIS — S022XXA Fracture of nasal bones, initial encounter for closed fracture: Secondary | ICD-10-CM | POA: Diagnosis present

## 2014-09-12 DIAGNOSIS — G40909 Epilepsy, unspecified, not intractable, without status epilepticus: Secondary | ICD-10-CM | POA: Diagnosis present

## 2014-09-12 DIAGNOSIS — F1310 Sedative, hypnotic or anxiolytic abuse, uncomplicated: Secondary | ICD-10-CM | POA: Diagnosis present

## 2014-09-12 DIAGNOSIS — Y92009 Unspecified place in unspecified non-institutional (private) residence as the place of occurrence of the external cause: Secondary | ICD-10-CM

## 2014-09-12 DIAGNOSIS — S0191XA Laceration without foreign body of unspecified part of head, initial encounter: Secondary | ICD-10-CM | POA: Diagnosis present

## 2014-09-12 HISTORY — DX: Depression, unspecified: F32.A

## 2014-09-12 HISTORY — DX: Unspecified convulsions: R56.9

## 2014-09-12 HISTORY — DX: Anxiety disorder, unspecified: F41.9

## 2014-09-12 HISTORY — DX: Polycystic ovarian syndrome: E28.2

## 2014-09-12 HISTORY — DX: Attention-deficit hyperactivity disorder, unspecified type: F90.9

## 2014-09-12 LAB — CBC AND DIFFERENTIAL
Basophils %: 0.7 % (ref 0.0–3.0)
Basophils Absolute: 0.1 10*3/uL (ref 0.0–0.3)
Eosinophils %: 1.3 % (ref 0.0–7.0)
Eosinophils Absolute: 0.2 10*3/uL (ref 0.0–0.8)
Hematocrit: 43.1 % (ref 36.0–48.0)
Hemoglobin: 14.5 gm/dL (ref 12.0–16.0)
Lymphocytes Absolute: 3.1 10*3/uL (ref 0.6–5.1)
Lymphocytes: 21.1 % (ref 15.0–46.0)
MCH: 29 pg (ref 28–35)
MCHC: 34 gm/dL (ref 32–36)
MCV: 86 fL (ref 80–100)
MPV: 7.1 fL (ref 6.0–10.0)
Monocytes Absolute: 0.5 10*3/uL (ref 0.1–1.7)
Monocytes: 3.2 % (ref 3.0–15.0)
Neutrophils %: 73.8 % (ref 42.0–78.0)
Neutrophils Absolute: 10.9 10*3/uL — ABNORMAL HIGH (ref 1.7–8.6)
PLT CT: 405 10*3/uL (ref 130–440)
RBC: 5.01 10*6/uL — ABNORMAL HIGH (ref 3.80–5.00)
RDW: 11.9 % (ref 11.0–14.0)
WBC: 14.8 10*3/uL — ABNORMAL HIGH (ref 4.0–11.0)

## 2014-09-12 LAB — BASIC METABOLIC PANEL
Anion Gap: 16.7 mMol/L (ref 7.0–18.0)
BUN / Creatinine Ratio: 16.1 Ratio (ref 10.0–30.0)
BUN: 14 mg/dL (ref 7–22)
CO2: 20.2 mMol/L (ref 20.0–30.0)
Calcium: 9.5 mg/dL (ref 8.5–10.5)
Chloride: 105 mMol/L (ref 98–110)
Creatinine: 0.87 mg/dL (ref 0.60–1.20)
EGFR: >60 mL/min/{1.73_m2}
Glucose: 121 mg/dL — ABNORMAL HIGH (ref 70–99)
Osmolality Calc: 276 mOsm/kg (ref 275–300)
Potassium: 4.9 mMol/L (ref 3.5–5.3)
Sodium: 137 mMol/L (ref 136–147)

## 2014-09-12 LAB — HCG, SERUM, QUALITATIVE: BHCG Qualitative: NEGATIVE

## 2014-09-12 MED ORDER — MORPHINE SULFATE 2 MG/ML IJ/IV SOLN (WRAP)
2.0000 mg | Freq: Once | Status: AC
Start: 2014-09-12 — End: 2014-09-12
  Administered 2014-09-12: 2 mg via INTRAVENOUS

## 2014-09-12 MED ORDER — ENOXAPARIN SODIUM 40 MG/0.4ML SC SOLN
40.0000 mg | Freq: Every day | SUBCUTANEOUS | Status: DC
Start: 2014-09-13 — End: 2014-09-12

## 2014-09-12 MED ORDER — CEFTRIAXONE SODIUM 1 G IJ SOLR
INTRAMUSCULAR | Status: AC
Start: 2014-09-12 — End: ?
  Filled 2014-09-12: qty 1000

## 2014-09-12 MED ORDER — MORPHINE SULFATE (PF) 2 MG/ML IV SOLN
INTRAVENOUS | Status: AC
Start: 2014-09-12 — End: ?
  Filled 2014-09-12: qty 1

## 2014-09-12 MED ORDER — CEFAZOLIN SODIUM 1 G IJ SOLR
INTRAMUSCULAR | Status: AC
Start: 2014-09-12 — End: ?
  Filled 2014-09-12: qty 1000

## 2014-09-12 MED ORDER — LEVETIRACETAM IN NACL 1000 MG/100ML IV SOLN
1000.0000 mg | Freq: Two times a day (BID) | INTRAVENOUS | Status: DC
Start: 2014-09-12 — End: 2014-09-12

## 2014-09-12 MED ORDER — LORAZEPAM 2 MG/ML IJ SOLN
1.0000 mg | INTRAMUSCULAR | Status: DC | PRN
Start: 2014-09-12 — End: 2014-09-13

## 2014-09-12 MED ORDER — LIDOCAINE-EPINEPHRINE 2 %-1:200000 IJ SOLN
INTRAMUSCULAR | Status: AC
Start: 2014-09-12 — End: ?
  Filled 2014-09-12: qty 20

## 2014-09-12 MED ORDER — SODIUM CHLORIDE 0.9 % IV MBP
1.0000 g | Freq: Once | INTRAVENOUS | Status: AC
Start: 2014-09-12 — End: 2014-09-12
  Administered 2014-09-12: 1 g via INTRAVENOUS

## 2014-09-12 NOTE — ED Notes (Signed)
Facial surgeon completed at bedside.

## 2014-09-12 NOTE — ED Notes (Signed)
ED charge RN notified pt not to be assigned to rm [id number]. Awaiting new bed assignment.

## 2014-09-12 NOTE — ED Notes (Signed)
Pt removed from trauma protocol per Dr. Odis Luster, pt to be moved to room 19.  Family at bedside.

## 2014-09-12 NOTE — ED Notes (Signed)
Bed: CR3-A  Expected date:   Expected time:   Means of arrival:   Comments:  BRAVO

## 2014-09-12 NOTE — ED Notes (Signed)
Family provided bottles of pt's medications.  Family concerned that pt is on medication that she did not inform them off.  Pt appears to have taken approx 90 xanax 0.5mg  tablets since prescription was filled 10 days ago.  Pt has received 180 tablets since Feb 29 and both bottles are empty.  Dr. Nechama Guard informed.

## 2014-09-12 NOTE — H&P (Signed)
ACCESS TRAUMA HISTORY AND PHYSICAL    Date Time: 09/12/2014 9:26 PM  Patient Name: Christine Andersen  Attending Physician: Dr. Odis Luster  Primary Care Physician: Christa See, MD      Assessment:   The patient has the following active problems:      Patient Active Hospital Problem List:   Laceration of face, complex, initial encounter (09/12/2014)    Assessment: complex laceration involving vermillion border of lip    Plan: consult OMFS face call, Dr. Hinda Lenis   Fall involving bed as cause of accidental injury in home as place of occurrence (09/12/2014)    Assessment: no evidence of intracranial injury or cervical spine injury    Plan: clear from trauma fracture   Seizure disorder (09/12/2014)    Assessment: on anti seizure medication    Plan: she needs follow up with her neurologist               Plan:   Clear from trauma standpoint.  OMFS to repair laceration, patient will be discharged after repair.      Date of Admission:   09/12/2014  8:23 PM    Trauma Level:   BRAVO    Scene Report:    Scene GCS: Eye opening 4 - spontaneous, Verbal Response 5 - alert/oriented, Motor Response 6 - obeys commands. Total GCS: 15   Transport: ALS, Time of Injury 1 hour ago   Transferred from: Scene   LOC: Yes   Intubated: No   Hemodynamically: Stable   C-spine immobilized pre-hospital: Yes    CC:   Face pain    HPI:   Christine Andersen is a 29 y.o. female who presents to the hospital after Fall: Yes. She has a history of seizure disorder.  She had a seizure in the home and fell, hitting her face on her dresser.  LOC was related to her seizure.  She is treated with medication for seizures; she does not know the name of the medication.    Review of Systems:   General:  no weight loss, fever, night sweats, Pulmonary:  No shortness of breath, cough, sputum production, hemoptysis, Cardiac:  No exertional chest pain, dyspnea on exertion, palpitations and irregular heart beat, GI:  No nausea, vomiting, abdominal distention and constipation  and Neuro:  Yes seizures    Allergies:   No Known Allergies    Medication:     (Not in a hospital admission)    Histories:   No past medical history on file.  No past surgical history on file.  No family history on file.  History     Social History   . Marital Status: Single     Spouse Name: N/A   . Number of Children: N/A   . Years of Education: N/A     Occupational History   . Not on file.     Social History Main Topics   . Smoking status: Not on file   . Smokeless tobacco: Not on file   . Alcohol Use: Not on file   . Drug Use: Not on file   . Sexual Activity: Not on file     Other Topics Concern   . Not on file     Social History Narrative   . No narrative on file       Vaccination:   Tetanus up to date: Unknown    Physical Exam:     Filed Vitals:    09/12/14 2100   BP: 115/71  Pulse: 89   Temp:    Resp: 13   SpO2: 100%     General:  well-nourished, obese, in no apparent distress  HEENT:  normocephalic, lacerations at left cheek and  corner of left mouth, extending through vermillion border  Neck:  supple, No cervical spine bony tenderness, crepitance, or stepoff and Normal range of motion  Chest:  lungs clear to ausculation  Cardiac:  regular rate and rhythm without murmurs/rubs/gallops  Abdomen:  soft, nontender  Extremities:  no edema  Neuro:  alert, oriented x 3, no focal neurologic deficits, moves all extremities well, no involuntary movements    Labs:     Results     Procedure Component Value Units Date/Time    Beta HCG, Drema Dallas [[id number]] Collected:  09/12/14 2043    Specimen Information:  Plasma Updated:  09/12/14 2119     BHCG Qual Negative     Basic Metabolic Panel [[id number]]  (Abnormal) Collected:  09/12/14 2043    Specimen Information:  Plasma Updated:  09/12/14 2111     Sodium 137 mMol/L      Potassium 4.9 mMol/L      Chloride 105 mMol/L      CO2 20.2 mMol/L      CALCIUM 9.5 mg/dL      Glucose 147 (H) mg/dL      Creatinine 8.29 mg/dL      BUN 14 mg/dL      Anion Gap 56.2 mMol/L      BUN/Creatinine  Ratio 16.1 Ratio      EGFR >60 mL/min/1.64m2      Osmolality Calc 276 mOsm/kg     CBC [[id number]]  (Abnormal) Collected:  09/12/14 2043    Specimen Information:  Blood / Blood Updated:  09/12/14 2056     WBC 14.8 (H) K/cmm      RBC 5.01 (H) M/cmm      Hemoglobin 14.5 gm/dL      Hematocrit 46.9 %      MCV 86 fL      MCH 29 pg      MCHC 34 gm/dL      RDW 62.9 %      PLT CT 405 K/cmm      MPV 7.1 fL      NEUTROPHIL % 73.8 %      Lymphocytes 21.1 %      Monocytes 3.2 %      Eosinophils % 1.3 %      Basophils % 0.7 %      Neutrophils Absolute 10.9 (H) K/cmm      Lymphocytes Absolute 3.1 K/cmm      Monocytes Absolute 0.5 K/cmm      Eosinophils Absolute 0.2 K/cmm      BASO Absolute 0.1 K/cmm           Rads:     Ct Head Wo- Specify Condition/reader    09/12/2014   1.  Left periorbital and supraorbital hematoma. No evidence of skull fracture or intracranial hemorrhage. 2.  Soft tissue lacerations in the left infraorbital region, and fractures of the left and right nasal bones.  ReadingStation:[id number]    Ct Sinus Facial Bones Wo Contrast    09/12/2014   Soft tissue laceration left maxilla with fractures of the left and right nasal bones, and left periorbital and supraorbital hematoma. ReadingStation:[id number]    Ct Cervical Spine Wo Contrast    09/12/2014   No evidence of acute cervical spine fracture or subluxation.    ReadingStation:[email or web address]  The following images were received from an outside facility and reviewed:None    Consulting Services:   Face - [place]      Massive transfusion protocol:  No    Total Critical Care time minus procedures and teaching is 30-74 minutes        Signed by:  Alyson Locket., MD; 09/12/2014 9:26 PM  2

## 2014-09-12 NOTE — H&P (Addendum)
[facility] NOTE                                                                                                                                                               HISTORY AND PHYSICAL      Primary Care Providers:  PCP: None    Chief Complaint:  Seizures    HPI:  Christine Andersen is a 29 y.o. Caucasian female, medical history significant for seizure disorders diagnosing June 2015 in [place], with intolerance to Keppra, and currently on Lamictal, with 5 seizures since diagnosis, tube within the past 2 weeks including this presentation, polycystic ovarian syndrome, depression with anxiety, who comes to [facility] ED as a BRAVO activation.  Patient had a witnessed seizure activity at home, witnessed by her sister, general tonic-clonic in nature, lasting for a couple of minutes, without any bladder or bowel incontinence. Patient fell during the seizure, hitting the left side of her face against a wooden dresser, with significant facial trauma mainly to the left side of her face.  Further history obtained from parents indicate patient recently moved into the area from [place], within the past 2-3 weeks.  Patient is on Xanax 0.5 mg to be taken 3 times daily as needed, and as per family this was filled 10 days ago, for 90 tablets, with a bottle currently empty. Patient states that she's not had any Xanax for the past 2 days, and also admits that she does take more of her pills and is prescribed for her.  Imaging studies of the head and neck essentially show any skull fracture or intracranial hemorrhage  It did however show soft tissue lacerations to the left side of her face with nasal bone fracture  Maxillofacial surgeon on call was consulted, reviewed patient in ER, sutured lacerations, and to follow up in clinic in a  week  Recommended Keflex which has been initiated  Hospitalist service and consulted for admission, and patient will be admitted for seizures  Patient denies any other constitutional symptoms at this time    Past Medical History:   Past Medical History   Diagnosis Date   . PCOS (polycystic ovarian syndrome)    . Seizures    . Depression    . Anxiety    . ADHD (attention deficit hyperactivity disorder)        Past Surgical History:  Past Surgical History   Procedure Laterality Date   . Tonsillectomy     . Revision of scar Bilateral      breast   . Reduction mammaplasty Bilateral 2009       Allergies:  No Known Allergies    Outpatient Medications:  Current Facility-Administered Medications   Medication Dose Route Frequency Provider Last Rate Last Dose   .  LORazepam (ATIVAN) injection 1 mg  1 mg Intravenous PRN Ester Rink, MD         Current Outpatient Prescriptions   Medication Sig Dispense Refill   . ALPRAZolam (XANAX) 0.5 MG tablet Take 0.5 mg by mouth 3 (three) times daily as needed. Pt admits to taking more medicine than prescribed     . amphetamine-dextroamphetamine (ADDERALL) 30 MG tablet Take 30 mg by mouth 2 (two) times daily.     Marland Kitchen FLUoxetine (PROZAC) 20 MG capsule Take 20 mg by mouth daily. Takes 3 20mg  tablets every morning; patient states she only takes 1 capsule once a day       . lamoTRIgine (LAMICTAL) 100 MG tablet Take 300 mg by mouth daily.        . metFORMIN (GLUCOPHAGE) 500 MG tablet Take 500 mg by mouth 3 (three) times daily.     Marland Kitchen spironolactone (ALDACTONE) 50 MG tablet Take 50 mg by mouth daily.         Social History:  History   Substance Use Topics   . Smoking status: Former Smoker -- 0.25 packs/day     Types: Cigarettes     Quit date: 10/02/2013   . Smokeless tobacco: Never Used   . Alcohol Use: Yes      Comment: socially       Illicit Drug Use:   Reviewed and negative    Designated Health Care Proxy:   Madden Piazza, mother    Family History:  Family history was obtained and was non  contributory for cardiac history, cancer history.     Review of Systems:  All systems reviewed including eyes, ENT, cardiovascular, respiratory, gastrointestinal, genitourinary, psychiatric, neurologic, integumentary, allergic/hematology,  are reviewed and found unremarkable except pertinent positives mentioned in the history of present illness and past medical history.     Physical Exam:    Vital Signs:  BP 103/62 mmHg  Pulse 84  Temp(Src) 97.3 F (36.3 C)  Resp 15  Ht 1.651 m (5\' 5" )  Wt 100.3 kg (221 lb 1.9 oz)  BMI 36.80 kg/m2  SpO2 99%  LMP 08/12/2014    Intake/Output Summary (Last 24 hours) at 09/12/14 2320  Last data filed at 09/12/14 2029   Gross per 24 hour   Intake    100 ml   Output      0 ml   Net    100 ml         1) General Appearance:  Young Caucasian female, in no apparent acute      cardiorespiratory nor painful distress.     2) HEENT: Head with left periorbital ecchymoses, with suturing to left eyebrow, left lateral mouth, swelling      Eyes: Pink conjunctiva, anicteric sclera. Pupils are equally reactive to light and      accommodation. Extraocular muscles are intact.      ENT:  Patient has intact external auditory canal. No abnormal lesions or bleeding from      nose. Oral mucosa moist with no pharyngeal congestion, erythema or swelling.    3) Neck: Supple, with full range of motion without any discomfort. Trachea is central,       no JVD noted, no carotid bruit, no cervical masses palpable.    4) Chest: Clear to auscultation bilaterally, no adventitial sounds heard.    5) CVS:  S1, S2 normal intensity and regular rhythm. No murmurs, rubs or gallops      appreciated.    6) Abdomen:  Soft, non tender, non distended, no palpable mass. Bowel sounds audible.      No costovertebral angle tenderness noted.    7) Extremities: 2+ pulses without edema, cyanosis or clubbing.    8) Musculoskeletal: No joint deformities, tenderness or swelling noted. Full range of      motion in all the joints  examined.    9) Skin: Warm with normal skin turgor, as described on face    10) Lymphatics: No lymphadenopathy in axillary, cervial and inguinal area.     11) Neurological: Alert and oriented x 4. Cranial nerves II-XII intact. No gross focal        neurological deficits noted.    12) Psychiatric:  Mood and affect are appropriate.     Labs: Reviewed by me    Labs Reviewed   CBC AND DIFFERENTIAL - Abnormal; Notable for the following:     WBC 14.8 (*)     RBC 5.01 (*)     Neutrophils Absolute 10.9 (*)     All other components within normal limits   BASIC METABOLIC PANEL - Abnormal; Notable for the following:     Glucose 121 (*)     All other components within normal limits   HCG, SERUM, QUALITATIVE   VH URINALYSIS WITH MICROSCOPIC AND CULTURE IF INDICATED         VH URINE DRUG SCREEN       EKG: Reviewed by me  Ordered and pending    Imaging Studies: Reviewed by me  CT Head WO  Clinical History:  28 year old with facial trauma. Seizure-like activity and fall striking face.    Examination:  CT head without contrast, September 12, 2014.    Technique:  Noncontrast head CT was performed with 2.5 mm axial images. Multiplanar reformations are provided. Automated exposure  control techniques were used to obtain CT dose reduction and diagnostic images.    Comparison:  None available.    Findings:  There is no evidence of acute intracranial hemorrhage, mass effect, or territorial infarct. The sulci, ventricles, and  cisterns are symmetric and unremarkable. The gray-white matter differentiation is maintained. Midline structures are in  normal position. The brainstem and posterior fossa are grossly unremarkable.    There is a soft tissue laceration over the left maxilla, and there are fractures of the left and right nasal bones.  There are small air-fluid levels and frothy secretions in the left maxillary and sphenoid sinuses. The globes are  symmetric and unremarkable.     There is a left periorbital hematoma, and a supraorbital left  frontal hematoma, 25 mm transverse x 6 mm AP. There is no  acute skull fracture, and no sutural diastasis. The mastoid air cells are clear.    IMPRESSION:   1. Left periorbital and supraorbital hematoma. No evidence of skull fracture or intracranial hemorrhage.  2. Soft tissue lacerations in the left infraorbital region, and fractures of the left and right nasal bones.     CT SINUS FACIAL BONES WO CONTRAST  Clinical History:  29 year old with trauma. Seizure-like activity and fall striking face.    Examination:  CT facial bones without contrast September 12, 2014.    Technique:  Noncontrast CT of the facial bones was performed with contiguous axial images. Multiplanar reformations are provided.   Automated exposure control techniques were used to obtain CT dose reduction and diagnostic images.    Comparison:  Correlation with head CT April 10 (reported separately).    Findings:  There is a  soft tissue laceration involving the infraorbital left maxillary region. Gas extends from the skin surface  over the alveolar process and maxillary sinus. There is contiguous gas deep to the upper lip. There is no radiopaque  foreign body.    There are transverse fractures of the left and right nasal bones. There is moderate angulation on the right, and distal  distraction on the left. The nasal septum is midline. The lamina papyracea are intact. There are frothy secretions in  the left maxillary and sphenoid sinuses, and small air-fluid levels.    The orbital floors intact. The globes are symmetric and unremarkable.    There is normal alignment of each temporomandibular joint.    There is a left periorbital and supraorbital hematoma. There is no intraconal extension. There is no fracture of the  frontal bone or sinus.    The temporal bones and ossicles are intact. The pterygoid plates are intact.    IMPRESSION:   Soft tissue laceration left maxilla with fractures of the left and right nasal bones, and left periorbital  and  supraorbital hematoma.    CT Cervical Spine WO Contrast   Clinical History:  29 year old post trauma. History of seizure-like activity and fall striking face. Head injury.    Examination:  CT cervical spine without contrast September 12, 2014.    Technique:  Noncontrast CT of the cervical spine was performed with contiguous axial images. Multiplanar reformations are provided.   Automated exposure control techniques were used to obtain CT dose reduction and diagnostic images.    Comparison:  None available.    Findings:  There is a mild leftward curvature of the mid cervical spine. There is slight straightening of the normal lordosis.  Vertebral body heights are preserved, with no fracture or subluxation. The dens is intact. The craniocervical and  cervicothoracic junctions are intact. There is mild disc space narrowing at C5-6, and there are small endplate and  uncovertebral osteophytes. There is no significant central canal or foraminal stenosis.    Paravertebral soft tissues are unremarkable.    The lung apices are clear.    IMPRESSION:   No evidence of acute cervical spine fracture or subluxation.      Assessment and Plan:    1. Seizures  Breakthrough seizures on anticonvulsant therapy versus possible benzodiazepine withdrawal  On Lamictal, will continue, with as needed Ativan for any seizure-like activity  CIWA protocol for possible benzodiazepine withdrawal  Seizure and fall precautions  Neurology consult review    2. Laceration of face, complex, initial encounter, involving;   - soft tissue laceration of left maxilla   - fractures of left and right nasal bones   - left periorbital and supraorbital hematoma   - no evidence of skull fracture   - no evidence of intracranial hemorrhage  S/p suturing by OMFS, with official consult in chart  Keflex started as recommended  General wound care  Pain management    3. Fall against object, initial encounter  Due to # 1 above  With facial trauma as described and with  management as outlined above  Fall precautions    4. Depression and anxiety  Will resume Prozac  Hold Xanax  On CIWA for possible benzodiazepine withdrawal    5. Polycystic ovarian syndrome  Resume metformin and spironolactone    6. ADHD  Continue with Adderall    Plan of care discussed with patient and parents, with all in agreement as outlined, with verbalization of understanding and agreement  GI Prophylaxis: Famotidine    DVT Prophylaxis: SCD    CODE Status : FULL CODE    Ester Rink, MD  09/12/2014    11:20 PM

## 2014-09-12 NOTE — ED Notes (Signed)
Report to Julia, RN

## 2014-09-12 NOTE — ED Notes (Signed)
Additional staff at bedside, from xray [facility] [place], from lab [place], from [facility] [place] [facility] [place].

## 2014-09-12 NOTE — Consults (Signed)
CONSULTATION    Date Time: 09/12/2014 10:50 PM  Patient Name: Christine Andersen, Christine Andersen  Requesting Physician: Harriette Bouillon, MD      Reason for Consultation:   Patient had seizure fell and hit face on dresser sustaining facial lacerations.      Assessment:   1. Closed nasal bone fracture minimally displaced - no surgical intervention  2. 3cm laceration to left brow, 1 cm laceration to left temple region  3. Stellate laceration through the left commissure through and through about 5cm long  Exam: No other facial lacerations, EOMI, PERRL,  NOE stable, maxilla and mandible stable, chipped tooth #10, no tongue lacerations    Plan:   1. Suture lacerations bedside  2. Keflex 500mg  TID for 7 days   3. Analgesics per primary team  4. Follow up with Dr. Hinda Lenis in 1 week for suture removal at [facility]  5. Questions page Dr. Hinda Lenis     History:   Christine Andersen is a 29 y.o. female who presents to the hospital on 09/12/2014 with facial lacerations.     Past Medical History:     Past Medical History   Diagnosis Date   . PCOS (polycystic ovarian syndrome)    . Seizures        Past Surgical History:   History reviewed. No pertinent past surgical history.    Family History:   History reviewed. No pertinent family history.    Social History:     History     Social History   . Marital Status: Single     Spouse Name: N/A   . Number of Children: N/A   . Years of Education: N/A     Social History Main Topics   . Smoking status: Not on file   . Smokeless tobacco: Not on file   . Alcohol Use: Not on file   . Drug Use: Not on file   . Sexual Activity: Not on file     Other Topics Concern   . Not on file     Social History Narrative   . No narrative on file       Allergies:   No Known Allergies    Medications:       Review of Systems:   A comprehensive review of systems was: History obtained from chart review and the patient    Physical Exam:     Filed Vitals:    09/12/14 2230   BP: 111/64   Pulse: 93   Temp:    Resp: 26    SpO2: 100%       Intake and Output Summary (Last 24 hours) at Date Time    Intake/Output Summary (Last 24 hours) at 09/12/14 2250  Last data filed at 09/12/14 2029   Gross per 24 hour   Intake    100 ml   Output      0 ml   Net    100 ml       General appearance - alert, well appearing, and in no distress    Labs Reviewed:     Results     Procedure Component Value Units Date/Time    Merlene Morse [[id number]] Collected:  09/12/14 2043    Specimen Information:  Plasma Updated:  09/12/14 2119     BHCG Qual Negative     Basic Metabolic Panel [[id number]]  (Abnormal) Collected:  09/12/14 2043    Specimen Information:  Plasma Updated:  09/12/14 2111  Sodium 137 mMol/L      Potassium 4.9 mMol/L      Chloride 105 mMol/L      CO2 20.2 mMol/L      CALCIUM 9.5 mg/dL      Glucose 161 (H) mg/dL      Creatinine 0.96 mg/dL      BUN 14 mg/dL      Anion Gap 04.5 mMol/L      BUN/Creatinine Ratio 16.1 Ratio      EGFR >60 mL/min/1.33m2      Osmolality Calc 276 mOsm/kg     CBC [[id number]]  (Abnormal) Collected:  09/12/14 2043    Specimen Information:  Blood / Blood Updated:  09/12/14 2056     WBC 14.8 (H) K/cmm      RBC 5.01 (H) M/cmm      Hemoglobin 14.5 gm/dL      Hematocrit 47.8 %      MCV 86 fL      MCH 29 pg      MCHC 34 gm/dL      RDW 29.5 %      PLT CT 405 K/cmm      MPV 7.1 fL      NEUTROPHIL % 73.8 %      Lymphocytes 21.1 %      Monocytes 3.2 %      Eosinophils % 1.3 %      Basophils % 0.7 %      Neutrophils Absolute 10.9 (H) K/cmm      Lymphocytes Absolute 3.1 K/cmm      Monocytes Absolute 0.5 K/cmm      Eosinophils Absolute 0.2 K/cmm      BASO Absolute 0.1 K/cmm           Recent CBC   Recent Labs      09/12/14   2043   RBC  5.01*   HEMOGLOBIN  14.5   HEMATOCRIT  43.1   MCV  86   MCH  29   MCHC  34   RDW  11.9   MPV  7.1     Recent BMP   Recent Labs      09/12/14   2043   GLUCOSE  121*   BUN  14   CREATININE  0.87   CALCIUM  9.5   SODIUM  137   POTASSIUM  4.9   CHLORIDE  105   CO2  20.2       Rads:   Radiological Procedure  reviewed.     Signed by: Alinda Dooms

## 2014-09-12 NOTE — ED Notes (Signed)
Physician at bedside to suture pt face.

## 2014-09-12 NOTE — ED Notes (Addendum)
Seizure disorders; POCS with meds; prescribed xanax 0.5mg  TID, has taken 90 tablets (got the prescribed filled on 3/31 and last dose was on 4/4); has not taken any in the last two days; seizure today, fell and hit her face off of a dresser; [facility] is at bedside doing sutures; received 2mg  of morphine 15 minutes ago; vitals have been stable; mother upset with patient potentially about taking all of the xanax. Report received from [place], [place]

## 2014-09-12 NOTE — ED Notes (Signed)
Admitting at bedside 

## 2014-09-12 NOTE — Procedures (Signed)
PROCEDURE NOTE    Date Time: 09/12/2014 10:56 PM  Patient Name: ILLA, ENLOW E    Procedure:   No orders of the defined types were placed in this encounter.       Providers Performing:   Alinda Dooms    Consent:   This procedure has been fully reviewed with the patient and written informed consent has been obtained.    Timeout:   Was performed    Anesthesia:   local    Pre-Procedure Dx:   Complex laceration to the face, stellate 5cm lac through left commissure through and through  2.5 cm laceration to upper eyelid, 1cm laceration to left temple region.     Post-Procedure Dx:   Complex laceration to the face, stellate 5cm lac through left commissure through and through  2.5 cm laceration to upper eyelid, 1cm laceration to left temple region.     Estimated Blood Loss:    10ml     Complications:   none    Brief Notes:   Injected 10ml of lidocaine 1:100k with epinephrine locally around the lacerations.   Cleaned with sterile saline then betadine  Closed the complex stellate lac with 3-0 vicryl sutures deep, then 3-0 chromic gut sutures intraorally, then 5-0 prolene sutures on the skin  Closed both the upper lid and the left temple lacs with 5-0 prolene sutures.   No complications   Patient to follow up in one week with Dr. Hinda Lenis for suture removal, [facility] [phone]  Questions Page Dr. Hinda Lenis    Signed by: Alinda Dooms

## 2014-09-12 NOTE — ED Provider Notes (Signed)
ED DOC NOTE     History provided by the patient and EMS and family.    History is limited by none    HPI     29 y.o. female presents as a BRAVO activation with three facial lacerations following seizure today at home. Fell forward, striking head on metal dresser. Denies any further injury or trauma.    Associated symptoms are none.    After speaking with the patient's mother, mother stated patient has "not slept in two days." States she was watching TV with her sister and a friend when she began making a "gurgling" noise. Had a generalized, convulsive seizure. Upon retrieval of patient's medications, noticed two bottles of an unknown medication. According to the bottles, patient filled 90 0.5mg  Xanax tablets ten days prior. Patient has no tablets left in bottle. Stated the last time she had a dose of Xanax was two days prior.      Review of Systems   Review of Systems   Constitutional: Negative for fever, chills, diaphoresis and appetite change.   HENT: Negative for ear pain, sore throat, trouble swallowing and voice change.    Eyes: Negative for pain, redness and visual disturbance.   Respiratory: Negative for cough, chest tightness, shortness of breath and wheezing.    Cardiovascular: Negative for chest pain and leg swelling.   Gastrointestinal: Negative for nausea, vomiting, abdominal pain, diarrhea, constipation, blood in stool and abdominal distention.   Genitourinary: Negative for dysuria, urgency, frequency, flank pain, decreased urine volume, vaginal bleeding, vaginal discharge, difficulty urinating, genital sores, menstrual problem and pelvic pain.   Musculoskeletal: Negative for myalgias, joint swelling and arthralgias.   Skin: Positive for wound. Negative for color change and rash.   Neurological: Positive for seizures. Negative for dizziness, weakness, light-headedness and numbness.   Hematological: Negative for adenopathy. Does not bruise/bleed easily.   Psychiatric/Behavioral: Negative for suicidal  ideas, behavioral problems and agitation.     [x]  Except as marked above as positive, the remainder of the systems above were reviewed and found negative.      PAST MEDICAL/SURGICAL HISTORY:  Past Medical History   Diagnosis Date   . PCOS (polycystic ovarian syndrome)    . Seizures    . Depression    . Anxiety    . ADHD (attention deficit hyperactivity disorder)      Past Surgical History   Procedure Laterality Date   . Tonsillectomy     . Revision of scar Bilateral      breast   . Reduction mammaplasty Bilateral 2009     Additional Past Medical/Surgical History:       SOCIAL AND FAMILY HISTORY:  History   Substance Use Topics   . Smoking status: Former Smoker -- 0.25 packs/day     Types: Cigarettes     Quit date: 10/02/2013   . Smokeless tobacco: Never Used   . Alcohol Use: Yes      Comment: socially     History reviewed. No pertinent family history.     Additional Social and Family History:  Additional SH: [x]   I reviewed SH above       Additional FH: [x]  I reviewed FH above    No Known Allergies  Additional Allergies:    Prior to Admission medications    Medication Sig Start Date End Date Taking? Authorizing Provider   ALPRAZolam Prudy Feeler) 0.5 MG tablet Take 0.5 mg by mouth 3 (three) times daily as needed. Pt admits to taking more medicine than prescribed  Yes [provider]   FLUoxetine (PROZAC) 20 MG capsule Take 60 mg by mouth daily. Takes 3 20mg  tablets every morning   Yes [provider]   lamoTRIgine (LAMICTAL) 100 MG tablet Take 100 mg by mouth 3 (three) times daily.   Yes [provider]   metFORMIN (GLUCOPHAGE) 500 MG tablet Take 500 mg by mouth 3 (three) times daily.   Yes [provider]   spironolactone (ALDACTONE) 50 MG tablet Take 50 mg by mouth daily.   Yes [provider]       PHYSICAL EXAM   Filed Vitals:    09/12/14 2300   BP: 103/62   Pulse: 84   Temp:    Resp: 15   SpO2: 99%     [x]  Nursing note reviewed [x]  Vitals reviewed   Pulse Oximetry on Room  Air Normal  Physical Exam   Constitutional: She is oriented to person, place, and time. She appears well-developed and well-nourished. No distress.   HENT:   Head: Normocephalic and atraumatic.       Mouth/Throat: Oropharynx is clear and moist. No oropharyngeal exudate.   Laceration above left eyebrow, to lateral aspect left eye, to left anterior nose and to left lateral mouth extending superiorly into left malar area. TMS clear. No gross odontogenic trauma. PERRLA. EOMI. Anterior chamber and conjunctiva clear.    Eyes: Pupils are equal, round, and reactive to light. No scleral icterus.   Neck: Normal range of motion. No JVD present. No tracheal deviation present. No thyromegaly present.   Cervical collar placed and maintained until CT results.    Cardiovascular: Normal rate, regular rhythm, normal heart sounds and intact distal pulses.  Exam reveals no gallop and no friction rub.    No murmur heard.  Pulmonary/Chest: No respiratory distress. She has no wheezes. She has no rales.   Abdominal: Soft. She exhibits no distension. There is no tenderness. There is no rebound and no guarding.   Musculoskeletal: She exhibits no edema.   Lymphadenopathy:     She has no cervical adenopathy.   Neurological: She is alert and oriented to person, place, and time. She has normal strength. No cranial nerve deficit or sensory deficit. Coordination normal. GCS eye subscore is 4. GCS verbal subscore is 5. GCS motor subscore is 6.   SPEECH NORMAL   Skin: Skin is warm and dry. No rash noted. She is not diaphoretic.   Psychiatric: She has a normal mood and affect.       LABS/TESTS:  Results     Procedure Component Value Units Date/Time    Merlene Morse [[id number]] Collected:  09/12/14 2043    Specimen Information:  Plasma Updated:  09/12/14 2119     BHCG Qual Negative     Basic Metabolic Panel [[id number]]  (Abnormal) Collected:  09/12/14 2043    Specimen Information:  Plasma Updated:  09/12/14 2111     Sodium 137 mMol/L      Potassium 4.9  mMol/L      Chloride 105 mMol/L      CO2 20.2 mMol/L      CALCIUM 9.5 mg/dL      Glucose 528 (H) mg/dL      Creatinine 4.13 mg/dL      BUN 14 mg/dL      Anion Gap 24.4 mMol/L      BUN/Creatinine Ratio 16.1 Ratio      EGFR >60 mL/min/1.66m2      Osmolality Calc 276 mOsm/kg  CBC [[id number]]  (Abnormal) Collected:  09/12/14 2043    Specimen Information:  Blood / Blood Updated:  09/12/14 2056     WBC 14.8 (H) K/cmm      RBC 5.01 (H) M/cmm      Hemoglobin 14.5 gm/dL      Hematocrit 54.0 %      MCV 86 fL      MCH 29 pg      MCHC 34 gm/dL      RDW 98.1 %      PLT CT 405 K/cmm      MPV 7.1 fL      NEUTROPHIL % 73.8 %      Lymphocytes 21.1 %      Monocytes 3.2 %      Eosinophils % 1.3 %      Basophils % 0.7 %      Neutrophils Absolute 10.9 (H) K/cmm      Lymphocytes Absolute 3.1 K/cmm      Monocytes Absolute 0.5 K/cmm      Eosinophils Absolute 0.2 K/cmm      BASO Absolute 0.1 K/cmm             Ct Head Wo- Specify Condition/reader    09/12/2014   1.  Left periorbital and supraorbital hematoma. No evidence of skull fracture or intracranial hemorrhage. 2.  Soft tissue lacerations in the left infraorbital region, and fractures of the left and right nasal bones.  ReadingStation:[email or web address]    Ct Sinus Facial Bones Wo Contrast    09/12/2014   Soft tissue laceration left maxilla with fractures of the left and right nasal bones, and left periorbital and supraorbital hematoma. ReadingStation:[email or web address]    Ct Cervical Spine Wo Contrast    09/12/2014   No evidence of acute cervical spine fracture or subluxation.    ReadingStation:[email or web address]            EKG: NONE    ED COURSE:  BP 103/62 mmHg  Pulse 84  Temp(Src) 97.3 F (36.3 C)  Resp 15  Ht 1.651 m  Wt 100.3 kg  BMI 36.80 kg/m2  SpO2 99%  LMP 08/12/2014        PROCEDURES:        IMPRESSION:     29 y.o. female with  1. Closed fracture of nasal bone, initial encounter    2. Laceration of face, complex, initial encounter    3. Fall involving bed as cause of accidental injury in home as  place of occurrence    4. Benzodiazepine withdrawal with complication    5. Seizure        MDM:  I have evaluated this patient and the differential diagnosis includes, but is not limited to NEUROVASCULAR INJURY, TENDON INJURY, ARTERIAL INJURY,CONTAMINATED WOUND,INTRARTICULAR WOUND  INTRACRANIAL HEMORRHAGE, CVA, SDH, EDH, SKULL FRACTURE, CONCUSSION, COAGULOPATHY.  SEIZURES, ELECTROLYTE ABNORMALITY, SAH, ICH, INTRACRANIAL MASS, STATUS EPILEPTICUS  .  I have evaluated all labs and imaging studies and have determined that the patient requires admission to the hospital for further evaluation and treatment of   1. Closed fracture of nasal bone, initial encounter    2. Laceration of face, complex, initial encounter    3. Fall involving bed as cause of accidental injury in home as place of occurrence    4. Benzodiazepine withdrawal with complication    5. Seizure    .  I have explained all results to the patient and all questions have been answered.  The patient have been informed of the need  for admission and they are in agreement with this plan.      PLAN:   ED Disposition     Admit Bed Type: Telemetry [5]  Admitting Physician: Yolanda Manges A [[id number]]  Patient Class: Observation [104]          New Prescriptions    No medications on file         CONSULTATIONS:    DR. Odis Luster PRESENT IN RESPONSE OF BRAVO ACTIVATION.    DR. Hinda Lenis PRESENT IN ED FOR LACERATION REPAIR. 10:08 PM    DR.KHARSA TO DISCUSS CASE AND ARRANGE INPATIENT ADMISSION. 10:32 PM      Scribe Attestation statement    I, Harriette Bouillon, personally performed the services documented. Rachael Katrinka Blazing is scribing for me on [employer],Adryan E. I reviewed and confirm the accuracy of the information in this medical record.       Harriette Bouillon, MD  09/12/14 [phone]

## 2014-09-13 ENCOUNTER — Inpatient Hospital Stay: Payer: BC Managed Care – PPO

## 2014-09-13 DIAGNOSIS — F1310 Sedative, hypnotic or anxiolytic abuse, uncomplicated: Secondary | ICD-10-CM | POA: Diagnosis present

## 2014-09-13 LAB — VH URINALYSIS WITH MICROSCOPIC AND CULTURE IF INDICATED
Bilirubin, UA: NEGATIVE
Blood, UA: NEGATIVE
Glucose, UA: NEGATIVE mg/dL
Ketones UA: NEGATIVE mg/dL
Leukocyte Esterase, UA: NEGATIVE Leu/uL
Nitrite, UA: NEGATIVE
Protein, UR: NEGATIVE mg/dL
RBC, UA: 6 /hpf — ABNORMAL HIGH (ref 0–5)
Squam Epithel, UA: 1 /hpf (ref 0–2)
Urine Specific Gravity: 1.02 (ref 1.001–1.040)
Urobilinogen, UA: NORMAL mg/dL
WBC, UA: 1 /hpf (ref 0–4)
pH, Urine: 6 pH (ref 5.0–8.0)

## 2014-09-13 LAB — COMPREHENSIVE METABOLIC PANEL
ALT: 30 U/L (ref 0–55)
AST (SGOT): 24 U/L (ref 10–42)
Albumin/Globulin Ratio: 1.38 Ratio (ref 0.70–1.50)
Albumin: 3.6 gm/dL (ref 3.5–5.0)
Alkaline Phosphatase: 67 U/L (ref 40–145)
Anion Gap: 12.9 mMol/L (ref 7.0–18.0)
BUN / Creatinine Ratio: 17.3 Ratio (ref 10.0–30.0)
BUN: 13 mg/dL (ref 7–22)
Bilirubin, Total: 0.2 mg/dL (ref 0.1–1.2)
CO2: 20.3 mMol/L (ref 20.0–30.0)
Calcium: 9 mg/dL (ref 8.5–10.5)
Chloride: 108 mMol/L (ref 98–110)
Creatinine: 0.75 mg/dL (ref 0.60–1.20)
EGFR: >60 mL/min/{1.73_m2}
Globulin: 2.6 gm/dL (ref 2.0–4.0)
Glucose: 97 mg/dL (ref 70–99)
Osmolality Calc: 274 mOsm/kg — ABNORMAL LOW (ref 275–300)
Potassium: 4.2 mMol/L (ref 3.5–5.3)
Protein, Total: 6.2 gm/dL (ref 6.0–8.3)
Sodium: 137 mMol/L (ref 136–147)

## 2014-09-13 LAB — VH URINE DRUG SCREEN - NO CONFIRMATION
Amphetamine: POSITIVE — AB
Barbiturates: NEGATIVE
Buprenorphine, Urine: NEGATIVE
Cannabinoids: NEGATIVE
Cocaine: NEGATIVE
Opiates: POSITIVE — AB
Phencyclidine: NEGATIVE
Urine Benzodiazepines: POSITIVE — AB
Urine Creatinine Random: 127.44 mg/dL
Urine Methadone Screen: NEGATIVE
Urine Oxycodone: NEGATIVE
Urine Specific Gravity: 1.019 (ref 1.001–1.040)
pH, Urine: 6.1 pH (ref 5.0–8.0)

## 2014-09-13 LAB — CBC AND DIFFERENTIAL
Basophils %: 0.5 % (ref 0.0–3.0)
Basophils Absolute: 0.1 10*3/uL (ref 0.0–0.3)
Eosinophils %: 0.1 % (ref 0.0–7.0)
Eosinophils Absolute: 0 10*3/uL (ref 0.0–0.8)
Hematocrit: 40.8 % (ref 36.0–48.0)
Hemoglobin: 13.2 gm/dL (ref 12.0–16.0)
Lymphocytes Absolute: 2.6 10*3/uL (ref 0.6–5.1)
Lymphocytes: 16.4 % (ref 15.0–46.0)
MCH: 28 pg (ref 28–35)
MCHC: 32 gm/dL (ref 32–36)
MCV: 87 fL (ref 80–100)
MPV: 7.2 fL (ref 6.0–10.0)
Monocytes Absolute: 0.9 10*3/uL (ref 0.1–1.7)
Monocytes: 5.5 % (ref 3.0–15.0)
Neutrophils %: 77.4 % (ref 42.0–78.0)
Neutrophils Absolute: 12.2 10*3/uL — ABNORMAL HIGH (ref 1.7–8.6)
PLT CT: 370 10*3/uL (ref 130–440)
RBC: 4.72 10*6/uL (ref 3.80–5.00)
RDW: 12 % (ref 11.0–14.0)
WBC: 15.7 10*3/uL — ABNORMAL HIGH (ref 4.0–11.0)

## 2014-09-13 LAB — MAGNESIUM: Magnesium: 2.1 mg/dL (ref 1.6–2.6)

## 2014-09-13 MED ORDER — LAMOTRIGINE 100 MG PO TABS
100.0000 mg | ORAL_TABLET | Freq: Once | ORAL | Status: AC
Start: 2014-09-13 — End: 2014-09-13
  Administered 2014-09-13: 100 mg via ORAL
  Filled 2014-09-13: qty 1

## 2014-09-13 MED ORDER — SPIRONOLACTONE 50 MG PO TABS
50.0000 mg | ORAL_TABLET | Freq: Every day | ORAL | Status: DC
Start: 2014-09-13 — End: 2014-09-13
  Administered 2014-09-13: 50 mg via ORAL
  Filled 2014-09-13: qty 1

## 2014-09-13 MED ORDER — FLUOXETINE HCL 20 MG PO CAPS
20.0000 mg | ORAL_CAPSULE | Freq: Every day | ORAL | Status: DC
Start: 2014-09-13 — End: 2014-09-13
  Administered 2014-09-13: 20 mg via ORAL
  Filled 2014-09-13: qty 1

## 2014-09-13 MED ORDER — LORAZEPAM 2 MG/ML IJ SOLN
2.0000 mg | INTRAMUSCULAR | Status: DC | PRN
Start: 2014-09-13 — End: 2014-09-13

## 2014-09-13 MED ORDER — ONDANSETRON HCL 4 MG/2ML IJ SOLN
4.0000 mg | INTRAMUSCULAR | Status: DC | PRN
Start: 2014-09-13 — End: 2014-09-13

## 2014-09-13 MED ORDER — METFORMIN HCL 500 MG PO TABS
500.0000 mg | ORAL_TABLET | Freq: Three times a day (TID) | ORAL | Status: DC
Start: 2014-09-13 — End: 2014-09-13
  Administered 2014-09-13 (×3): 500 mg via ORAL
  Filled 2014-09-13 (×3): qty 1

## 2014-09-13 MED ORDER — LAMOTRIGINE 100 MG PO TABS
300.0000 mg | ORAL_TABLET | Freq: Every day | ORAL | Status: DC
Start: 2014-09-13 — End: 2014-09-13
  Filled 2014-09-13: qty 3

## 2014-09-13 MED ORDER — PATIENT SUPPLIED NON FORMULARY
30.0000 mg | Freq: Two times a day (BID) | Status: DC
Start: 2014-09-13 — End: 2014-09-13

## 2014-09-13 MED ORDER — ACETAMINOPHEN 325 MG PO TABS
650.0000 mg | ORAL_TABLET | ORAL | Status: DC | PRN
Start: 2014-09-13 — End: 2014-09-13
  Administered 2014-09-13 (×3): 650 mg via ORAL
  Filled 2014-09-13 (×3): qty 2

## 2014-09-13 MED ORDER — CEPHALEXIN 500 MG PO CAPS
500.0000 mg | ORAL_CAPSULE | Freq: Three times a day (TID) | ORAL | Status: DC
Start: 2014-09-13 — End: 2014-09-13
  Administered 2014-09-13 (×3): 500 mg via ORAL
  Filled 2014-09-13 (×5): qty 1

## 2014-09-13 MED ORDER — LAMOTRIGINE 200 MG PO TABS
200.0000 mg | ORAL_TABLET | Freq: Two times a day (BID) | ORAL | Status: DC
Start: 2014-09-13 — End: 2014-09-13
  Filled 2014-09-13: qty 1

## 2014-09-13 MED ORDER — LAMOTRIGINE 200 MG PO TABS
200.0000 mg | ORAL_TABLET | Freq: Two times a day (BID) | ORAL | Status: DC
Start: 2014-09-13 — End: 2022-03-20

## 2014-09-13 MED ORDER — ACETAMINOPHEN 650 MG RE SUPP
650.0000 mg | RECTAL | Status: DC | PRN
Start: 2014-09-13 — End: 2014-09-13

## 2014-09-13 MED ORDER — ACETAMINOPHEN 325 MG PO TABS
650.0000 mg | ORAL_TABLET | ORAL | Status: DC | PRN
Start: 2014-09-13 — End: 2014-09-13

## 2014-09-13 MED ORDER — LAMOTRIGINE 100 MG PO TABS
100.0000 mg | ORAL_TABLET | Freq: Three times a day (TID) | ORAL | Status: DC
Start: 2014-09-13 — End: 2014-09-13
  Filled 2014-09-13 (×2): qty 1

## 2014-09-13 MED ORDER — LAMOTRIGINE 200 MG PO TABS
300.0000 mg | ORAL_TABLET | Freq: Every day | ORAL | Status: DC
Start: 2014-09-13 — End: 2014-09-13
  Filled 2014-09-13 (×2): qty 1

## 2014-09-13 MED ORDER — FAMOTIDINE 20 MG PO TABS
20.0000 mg | ORAL_TABLET | Freq: Two times a day (BID) | ORAL | Status: DC
Start: 2014-09-13 — End: 2014-09-13
  Administered 2014-09-13 (×2): 20 mg via ORAL
  Filled 2014-09-13 (×3): qty 1

## 2014-09-13 MED ORDER — LAMOTRIGINE 100 MG PO TABS
100.0000 mg | ORAL_TABLET | Freq: Three times a day (TID) | ORAL | Status: DC
Start: 2014-09-13 — End: 2014-09-13
  Administered 2014-09-13: 100 mg via ORAL
  Filled 2014-09-13 (×3): qty 1

## 2014-09-13 NOTE — PT Progress Note (Signed)
[facility]  [facility]  [address]  [place] [address]  Department of Rehabilitation Services  [phone]  Christine Andersen CSN: [id number]  NEURO SURGICAL [id number]-A    Physical Therapy General Note  Time: 1415    Attempted to see pt for PT eval    Team Communication: pt, parents, and RN    Attempted to work with pt in morning but pt saying that she walked ok in room and didn't feel need for PT at that time.  Checked with her again in pm and pt now having EEG.    Plan to evaluated pt tomorrow per POC.     Reynold Bowen    PT Progress Note

## 2014-09-13 NOTE — Consults (Signed)
NEUROLOGY CONSULT NOTE    Date / Time: 09/13/2014 12:33 PM  Patient Name: Christine Andersen  Attending Physician: Marlou Sa, MD      CHIEF COMPLAINT:     Chief Complaint   Patient presents with   . Facial Injury       HISTORY OF PRESENT ILLNESS:   Christine Andersen FAO:[id number], MRN: [id number], is a 29 y.o. female, with questionable history of epilepsy, ADHD, anxiety, depression and PCOS on whom I have been asked to consult for seizures.     The patient was admitted on the evening of 4/10 after having a witnessed convulsive seizure lasting 2-3 minutes. She was in her bedroom, when she fell, seizing, hitting the left side of her face/head on a wooden dresser. Her sister witnessed the event. She was seen by maxillofacial surgeon for her injuries.     History is provided by the patient and her mother who is at bedside. The patient has a history of ADHD and has taken Adderall since 2008. Just moved to the [place] area in the last 3 weeks. She reports having increased depression and anxiety, psychosocial stressors, and was seeing a psychiatrist in [place] since last year. She was prescribed both Prozac and Xanax, not taking either as prescribed, since 10/2013. She had her first seizure in July or August of 2015, and was seen by a neurologist in [place], who obtained an MRI brain and routine EEG. Reportedly Keppra was initiated, she was intolerant due to worsening depression and suicidal ideation. She was changed to Lamictal.  She reports that the first neurologist left, so she had to get established with another. She was last seen by Dr. Patrcia Dolly in [place], [place] who obtained another EEG, continued Lamictal. She reports having a total of 5 seizures since diagnosis, all convulsive. First seizure, she did bite her tongue. Others involved urinary incontinence. The patient is unable to state whether she was diagnosed with epilepsy.     The patient admits to overuse of her Xanax. She filled her last prescription on 3/31 for 90 0.5  mg Xanax, using all of them in 7-10 days. She is unable to state when she exactly ran out. Thinks it was 2-3 days prior to the day of admission. When asked about overuse, she reports "I need them". UDS was positive for amphetamine, BZDs and opiates.  Additionally, she reports that she had not been sleeping for the last 2-3 days prior to admission.     Currently, the patient is resting in bed, mother at bedside. Mom is upset regarding the Xanax usage, and is asking for the patient to be set up with psychiatry and counseling upon discharge. There have been no events inpatient, remains on CIWA. She complains of pain in her left face that is improving.    REVIEW OF SYSTEMS:   A ten review of systems, including HEENT, CVS, CNS, respiratory, GI, GU, endocrine, hematologic, lymphatic, dermatologic was unremarkable except for the ones mentioned in the HPI.    PAST MEDICAL HISTORY:     Past Medical History   Diagnosis Date   . PCOS (polycystic ovarian syndrome)    . Seizures    . Depression    . Anxiety    . ADHD (attention deficit hyperactivity disorder)        FAMILY HISTORY:   History reviewed. No pertinent family history.     SOCIAL HISTORY:     History     Social History   . Marital Status: Single  Spouse Name: N/A   . Number of Children: N/A   . Years of Education: N/A     Social History Main Topics   . Smoking status: Former Smoker -- 0.25 packs/day     Types: Cigarettes     Quit date: 10/02/2013   . Smokeless tobacco: Never Used   . Alcohol Use: Yes      Comment: socially   . Drug Use: No   . Sexual Activity: Not on file     Other Topics Concern   . None     Social History Narrative   . None       ALLERGIES:     Allergies   Allergen Reactions   . Keppra [Levetiracetam]      Depression, anger, suicidal thoughts       MEDICATIONS:     Prior to Admission medications    Medication Sig Start Date End Date Taking? Authorizing Provider   ALPRAZolam Prudy Feeler) 0.5 MG tablet Take 0.5 mg by mouth 3 (three) times daily as  needed. Pt admits to taking more medicine than prescribed   Yes [provider]   amphetamine-dextroamphetamine (ADDERALL) 30 MG tablet Take 30 mg by mouth 2 (two) times daily.   Yes [provider]   FLUoxetine (PROZAC) 20 MG capsule Take 20 mg by mouth daily. Takes 3 20mg  tablets every morning; patient states she only takes 1 capsule once a day     Yes [provider]   lamoTRIgine (LAMICTAL) 100 MG tablet Take 300 mg by mouth daily.      Yes [provider]   metFORMIN (GLUCOPHAGE) 500 MG tablet Take 500 mg by mouth 3 (three) times daily.   Yes [provider]   spironolactone (ALDACTONE) 50 MG tablet Take 50 mg by mouth daily.   Yes [provider]       Current Facility-Administered Medications   Medication Dose Route Frequency   . cephALEXin  500 mg Oral Q8H SCH   . famotidine  20 mg Oral Q12H SCH   . FLUoxetine  20 mg Oral Daily   . lamoTRIgine  100 mg Oral Q8H SCH   . metFORMIN  500 mg Oral TID MEALS   . NON-FORMULARY PAT OWN MED order form  30 mg Oral BID   . spironolactone  50 mg Oral Daily            PHYSICAL EXAM:     Filed Vitals:    09/13/14 1100   BP: 116/69   Pulse:    Temp: 97.4 F (36.3 C)   Resp: 16   SpO2:        General:    - RRR   - S1/S2 normal   - No carotid bruits   - Lungs CTA   - Facial lacerations above left eye, left side of mouth with sutures, nasal injury.    NEUROLOGICAL EXAM:   Mental Status:   General:  The patient is a well developed, well nourished white female, in no acute distress. Mood and affect normal. Recent and remote memory are intact.   - Level of Alertness: Normal   - Orientation: Oriented x 3   - Aphasia: Absent    Cranial Nerves:    - Pupils: Equal and reactive to light   - Visual Fields: Normal   - Extraocular Movements: Normal   - Facial Sensations: Intact   - Facial Strength: Intact   - Hearing: Intact   - Palate Elevation: Symmetric   -  Gag: Not tested   - Tongue Movement: Normal   - Shoulder raise: Normal   -  Speech: Normal    Motor:    - Tone: Normal   - Strength: 5/5 throughout    DTRs:   - 1+ and symmetric bilaterally    Sensation:    - Light touch: Intact    Coordination:    - Finger to nose: Normal   - Heel to shin: Normal    Gait:    - Not assessed     SIGNIFICANT LABS:     Results     Procedure Component Value Units Date/Time    Urinalysis w Microscopic and Culture if Indicated [[id number]]  (Abnormal) Collected:  09/13/14 0715    Specimen Information:  Urine, Random Updated:  09/13/14 0748     Color, UA Yellow      Clarity, UA Slightly Cloudy (A)      Specific Gravity, UR 1.020      pH, Urine 6.0 pH      Protein, UR Negative mg/dL      Glucose, UA Negative mg/dL      Ketones UA Negative mg/dL      Bilirubin, UA Negative      Blood, UA Negative      Nitrite, UA Negative      Urobilinogen, UA Normal mg/dL      Leukocyte Esterase, UA Negative Leu/uL      UR Micro Performed      WBC, UA 1 /hpf      RBC, UA 6 (H) /hpf      Bacteria, UA Rare (A) /hpf      Squam Epithel, UA 1 /hpf     Urine Drug Screen [[id number]]  (Abnormal) Collected:  09/13/14 0715    Specimen Information:  Urine, Random Updated:  09/13/14 0741     Creatinine, UR 127.44 mg/dL      pH, Urine 6.1 pH      Specific Gravity, UR 1.019      Amphetamine Positive (A)      Barbiturates Negative      Benzodiazepines Positive (A)      Cannabinoids Negative      Cocaine Negative      Methadone Screen, Urine Negative      Opiates Positive (A)      OXYCODONE, URINE Negative      Phencyclidine Negative      Buprenorphine, Urine Negative     Comprehensive metabolic panel [[id number]]  (Abnormal) Collected:  09/13/14 0359    Specimen Information:  Plasma Updated:  09/13/14 0509     Sodium 137 mMol/L      Potassium 4.2 mMol/L      Chloride 108 mMol/L      CO2 20.3 mMol/L      CALCIUM 9.0 mg/dL      Glucose 97 mg/dL      Creatinine 4.40 mg/dL      BUN 13 mg/dL      Protein, Total 6.2 gm/dL      Albumin 3.6 gm/dL      Alkaline Phosphatase 67 U/L      ALT 30 U/L      AST  (SGOT) 24 U/L      Bilirubin, Total 0.2 mg/dL      Albumin/Globulin Ratio 1.38 Ratio      Anion Gap 12.9 mMol/L      BUN/Creatinine Ratio 17.3 Ratio      EGFR >60 mL/min/1.74m2      Osmolality  Calc 274 (L) mOsm/kg      Globulin 2.6 gm/dL     Magnesium [[id number]] Collected:  09/13/14 0359    Specimen Information:  Plasma Updated:  09/13/14 0503     Magnesium 2.1 mg/dL     CBC and differential [[id number]]  (Abnormal) Collected:  09/13/14 0359    Specimen Information:  Blood / Blood Updated:  09/13/14 0445     WBC 15.7 (H) K/cmm      RBC 4.72 M/cmm      Hemoglobin 13.2 gm/dL      Hematocrit 78.2 %      MCV 87 fL      MCH 28 pg      MCHC 32 gm/dL      RDW 95.6 %      PLT CT 370 K/cmm      MPV 7.2 fL      NEUTROPHIL % 77.4 %      Lymphocytes 16.4 %      Monocytes 5.5 %      Eosinophils % 0.1 %      Basophils % 0.5 %      Neutrophils Absolute 12.2 (H) K/cmm      Lymphocytes Absolute 2.6 K/cmm      Monocytes Absolute 0.9 K/cmm      Eosinophils Absolute 0.0 K/cmm      BASO Absolute 0.1 K/cmm     Beta HCG, Qual [[id number]] Collected:  09/12/14 2043    Specimen Information:  Plasma Updated:  09/12/14 2119     BHCG Qual Negative     Basic Metabolic Panel [[id number]]  (Abnormal) Collected:  09/12/14 2043    Specimen Information:  Plasma Updated:  09/12/14 2111     Sodium 137 mMol/L      Potassium 4.9 mMol/L      Chloride 105 mMol/L      CO2 20.2 mMol/L      CALCIUM 9.5 mg/dL      Glucose 284 (H) mg/dL      Creatinine 1.32 mg/dL      BUN 14 mg/dL      Anion Gap 44.0 mMol/L      BUN/Creatinine Ratio 16.1 Ratio      EGFR >60 mL/min/1.80m2      Osmolality Calc 276 mOsm/kg     CBC [[id number]]  (Abnormal) Collected:  09/12/14 2043    Specimen Information:  Blood / Blood Updated:  09/12/14 2056     WBC 14.8 (H) K/cmm      RBC 5.01 (H) M/cmm      Hemoglobin 14.5 gm/dL      Hematocrit 64.4 %      MCV 86 fL      MCH 29 pg      MCHC 34 gm/dL      RDW 03.4 %      PLT CT 405 K/cmm      MPV 7.1 fL      NEUTROPHIL % 73.8 %      Lymphocytes 21.1  %      Monocytes 3.2 %      Eosinophils % 1.3 %      Basophils % 0.7 %      Neutrophils Absolute 10.9 (H) K/cmm      Lymphocytes Absolute 3.1 K/cmm      Monocytes Absolute 0.5 K/cmm      Eosinophils Absolute 0.2 K/cmm      BASO Absolute 0.1 K/cmm           SIGNIFICANT IMAGING:  CT Cervical Spine WO Contrast   Final Result   No evidence of acute cervical spine fracture or subluxation.          ReadingStation:[email or web address]      CT SINUS FACIAL BONES WO CONTRAST   Final Result   Soft tissue laceration left maxilla with fractures of the left and right nasal bones, and left periorbital and   supraorbital hematoma.   ReadingStation:[email or web address]      CT Head WO- Specify Condition/Reader   Final Result   1.  Left periorbital and supraorbital hematoma. No evidence of skull fracture or intracranial hemorrhage.   2.  Soft tissue lacerations in the left infraorbital region, and fractures of the left and right nasal bones.    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 Epilepsy vs provoked seizure secondary to benzodiazepine withdrawal and sleep deprivation.    Will try to obtain records from most recent neurologist in [place]. MRI brain with epilepsy protocol. Routine EEG. Continue current medications for now.     [place] driving laws discussed with the patient and her mother. She is advised that she should not drive for 6 months from the date of the last seizure.    Case discussed with Dr. Sherilyn Cooter who will additionally review diagnostics/labs, personally evaluate the patient, further determine the treatment plan, and amend this note as indicated. Thank you for the consultation.    Cala Bradford, PA-C  09/13/2014  12:33 PM  [phone]

## 2014-09-13 NOTE — Procedures (Signed)
PROCEDURE: inpatient EEG  REQUESTING CLINICIAN: Merlene Laughter, PA    HISTORY:  The patient is a 29 year old woman admitted after a generalized convulsion with facial injury.  She has a history of recurrent seizures in the past year.  This EEG was requested to evaluate for epileptiform abnormalities.    MEDICATIONS:   Current Facility-Administered Medications   Medication Dose Route Frequency   . cephALEXin  500 mg Oral Q8H SCH   . famotidine  20 mg Oral Q12H SCH   . FLUoxetine  20 mg Oral Daily   . lamoTRIgine  200 mg Oral BID   . metFORMIN  500 mg Oral TID MEALS   . NON-FORMULARY PAT OWN MED order form  30 mg Oral BID   . spironolactone  50 mg Oral Daily       DESCRIPTION:  The duration of the recording was 33 minutes.  Clinical state: awake and asleep    FINDINGS:  The waking background showed appropriate organization with clearly defined anterior-posterior voltage and frequency gradients.  There was a well-defined posterior dominant rhythm of 9.5 Hertz, which was symmetrical and showed normal reactivity.  Anteriorly, there was an expected pattern of lower voltage, irregular, mixed faster frequencies.      Attenuation of the occipital rhythm accompanied drowsiness.  The sleep background was appropriately organized with well-developed spindles and vertex waves.  These sleep transients showed appropriate morphology and are bilaterally synchronous and symmetrical.     During waDuring wakefulness and sleep there were sporadic generalized epileptiform discharges consisting of high voltage, frontocentrally distributed, bilaterally synchronous spike and wave complexes.    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  The surrounding EEG background was normal.

## 2014-09-13 NOTE — OT Progress Note (Signed)
[facility]  [facility]  [address]  [place] [address]  Department of Rehabilitation Services  [phone]    ANESHA HACKERT CSN: [id number]  NEURO SURGICAL 427/427-A    Occupational Therapy General Note  Time: 1450    Attempted to see Pt for OT Eval., Pt off floor for EEG and then MRI; hold testing at this time. Plan to follow up later this date as schedule allows, otherwise follow up tomorrow.     Team communication: PT; nursing     Ezzie Dural, MS, OTR/L

## 2014-09-13 NOTE — Progress Notes (Signed)
Pt down to eeg then mri

## 2014-09-13 NOTE — Discharge Instructions (Signed)
Provided education to patient about wound care as well

## 2014-09-13 NOTE — UM Notes (Signed)
[facility] Utilization Management Review Sheet    NAME: Christine Andersen  MR#: [id number]    CSN#: [id number]    ROOM: 427/427-A AGE: 29 y.o.    ADMIT DATE AND TIME: 09/12/2014  8:23 PM      PATIENT CLASS: Observation    ATTENDING PHYSICIAN: Marlou Sa, MD  PAYOR:Payor: OUT OF STATE BLUE CROSS / Plan: BCBS OUT OF STATE / Product Type: *No Product type* /       AUTH #:     DIAGNOSIS:     ICD-10-CM    1. Closed fracture of nasal bone, initial encounter S02.2XXA    2. Laceration of face, complex, initial encounter S01.91XA    3. Fall involving bed as cause of accidental injury in home as place of occurrence W06.XXXA     Y92.009    4. Benzodiazepine withdrawal with complication F13.239    5. Seizure R56.9        HISTORY:   Past Medical History   Diagnosis Date   . PCOS (polycystic ovarian syndrome)    . Seizures    . Depression    . Anxiety    . ADHD (attention deficit hyperactivity disorder)        DATE OF REVIEW: 09/13/2014    VITALS: BP 112/65 mmHg  Pulse 76  Temp(Src) 97.6 F (36.4 C) (Oral)  Resp 16  Ht 1.651 m (5\' 5" )  Wt 100.3 kg (221 lb 1.9 oz)  BMI 36.80 kg/m2  SpO2 99%  LMP 08/12/2014    HPI- presents to the hospital after Fall: Yes. She has a history of seizure disorder. She had a seizure in the home and fell, hitting her face on her dresser. LOC was related to her seizure. She is treated with medication for seizures; she does not know the name of the medication.    Patient had a witnessed seizure activity at home, witnessed by her sister, general tonic-clonic in nature, lasting for a couple of minutes, without any bladder or bowel incontinence. Patient fell during the seizure, hitting the left side of her face against a wooden dresser, with significant facial trauma mainly to the left side of her face.  Further history obtained from parents indicate patient recently moved into the area from [place], within the past 2-3 weeks.  Patient is on Xanax 0.5 mg to be taken 3 times daily as needed, and as  per family this was filled 10 days ago, for 90 tablets, with a bottle currently empty. Patient states that she's not had any Xanax for the past 2 days, and also admits that she does take more of her pills and is prescribed for her.  Imaging studies of the head and neck essentially show any skull fracture or intracranial hemorrhage  It did however show soft tissue lacerations to the left side of her face with nasal bone fracture  Maxillofacial surgeon on call was consulted, reviewed patient in ER, sutured lacerations, and to follow up in clinic in a week  Recommended Keflex which has been initiated  Hospitalist service and consulted for admission, and patient will be admitted for seizures  Patient denies any other constitutional symptoms at this time      Oral surgery consult-  Reason for Consultation:   Patient had seizure fell and hit face on dresser sustaining facial lacerations.     Assessment:   1. Closed nasal bone fracture minimally displaced - no surgical intervention  2. 3cm laceration to left brow, 1 cm laceration to  left temple region  3. Stellate laceration through the left commissure through and through about 5cm long  Exam: No other facial lacerations, EOMI, PERRL, NOE stable, maxilla and mandible stable, chipped tooth #10, no tongue lacerations    Plan:   1. Suture lacerations bedside  2. Keflex 500mg  TID for 7 days   3. Analgesics per primary team  4. Follow up with Dr. Hinda Lenis in 1 week for suture removal at [facility]  5. Questions page Dr. Hinda Lenis     Labs- 4/11  WBC 15.7,     CT cervical spine- No evidence of acute cervical spine fracture or subluxation.    CT head-   1. Left periorbital and supraorbital hematoma. No evidence of skull fracture or intracranial hemorrhage.  2. Soft tissue lacerations in the left infraorbital region, and fractures of the left and right nasal bones.     CT sinus-  Soft tissue laceration left maxilla with fractures of the left and  right nasal bones, and left periorbital and supraorbital hematoma.    Meds- IV Ancef x2, PO Keflex q8h, IV Morphine x1, PO Tylenol x2,     Admit observation  VS q shift  Continuous tele monitoring  Seizure precautions  Pulse ox q shift  SCDs for DVT prophylaxis  PT/OT consult  Aspiration precautions  CIWA protocol  Pain management  Falls precautions      Adelina Mings, RN, CCRN,CNRN   Utilization Review Nurse  Utilization Management  [facility]  [address]  [place], [place] [address]  [phone] Direct Line  [phone] Fax  [email or web address]

## 2014-09-13 NOTE — Plan of Care (Signed)
Problem: Health Promotion  Goal: Vaccination Screening  All patients will be screened for current vaccination status on each admission.   Outcome: Progressing    Problem: Safety  Goal: Patient will be free from injury during hospitalization  Outcome: Progressing    Problem: Pain  Goal: Patient's pain/discomfort is manageable  Outcome: Progressing

## 2014-09-13 NOTE — Progress Notes (Signed)
AA faxed information request sheet to [place] office

## 2014-09-13 NOTE — Progress Notes (Signed)
Patient received to neuro with several face lacerations: left side of mouth with stitches, right bottom lip with stitches, left upper eye with stitches, left side of left eye with stitches, nose without stitches. Her left eye area also has an abrasion that has been weeping a small amount of serosanguinous drainage - applied telfa. Patient did have a bloody nose for about 5 minutes. CIWA 2. VSS. Patient currently resting. Will need UA done when patient uses restroom next. Will continue to monitor.

## 2014-09-13 NOTE — ED Notes (Addendum)
Pts family requesting to speak with MD about pt being placed on keppra, states pts neurologist advised her never to take. Family made aware keppra is not on pts admitting orders. Pts family agrees to take pts own medications home. No changes in pt assessment. Pt states facial pain is currently 6/10. Pt given ice chips. Pt sitting up in bed and family at bedside. Pt ready for transport.

## 2014-09-13 NOTE — Progress Notes (Signed)
Provided education about wound care as well to pt until sutures out.

## 2014-09-13 NOTE — Plan of Care (Signed)
Problem: Safety  Goal: Patient will be free from injury during hospitalization  Outcome: Progressing    Problem: Pain  Goal: Patient's pain/discomfort is manageable  Outcome: Progressing  Skin sutures in place, healing, ice pack applied to L eye

## 2014-09-13 NOTE — Progress Notes (Addendum)
Pt has ambulated to the BR with standby, no neuro changes during the day or seizure like events. Tele NSR. Left eye edema has improved during the day, she is now able to open eye at end of shift with no blurred vision. Stitches intact. No headache. CIWA has been a 0-1 during the day today. Vitals WNL, Seizure precautions in effect. Bed alarm set.     Pt states that she doe snot want to take her adderall while she is in the hospital.

## 2014-09-13 NOTE — Progress Notes (Signed)
Pt return from eeg and mri

## 2014-09-13 NOTE — Progress Notes (Signed)
[facility]   [address]   [place] [address]     INITIAL ASSESSMENT  Case Management / Social Work         Estimated D/C Date: tbd      PCP/Last visit: Recently moved from [place], needs to establish      Home assessment/PLOF: lives with parents, independent in [place]      Financials and Insurance:  BCBS      Community Services: n/a       DME's/Supplier: n/a      Inpatient Plan of Care:   Seizures  Breakthrough seizures on anticonvulsant therapy versus possible benzodiazepine withdrawal  On Lamictal, will continue, with as needed Ativan for any seizure-like activity  CIWA protocol for possible benzodiazepine withdrawal  Seizure and fall precautions  Neurology consult   Facial trauma, S/p suturing by OMFS    CM Interventions: chart reviewed, neuuro consult pending, following for d/c needs - likely will need transition clinic to establish medical care as well as neurologist      Transportation: parents      Barriers to discharge:  none      D/C Plan and Needs:  medical follow up      Reviewed chart notes and discussed d/c plan with MD, bedside RN, therapy and patient and family when available during inpatient course of treatment.          Sanda Klein, RN BSN  Nurse Case Manager  Ph [phone]

## 2014-09-13 NOTE — Progress Notes (Addendum)
Spoke with pharmacist pt takes lamictal ER 100mg  3 tablets in a.m. She also has been taking adderall after her seizure diagnosis in july

## 2014-09-13 NOTE — Discharge Summary (Signed)
[facility]      Patient: Christine Andersen  Admission Date: 09/12/2014   DOB: 03/25/1986  Discharge Date: 09/13/2014    MRN: [id number]  Discharge Attending: Durward Mallard, MD   Referring Physician: Christa See, MD  PCP: Christa See, MD       DISCHARGE SUMMARY     Discharge Information   Discharge Diagnoses:      Seizures    Laceration of face, complex, initial encounter    Seizure disorder    PCOS (polycystic ovarian syndrome)    Depression with anxiety       Discharge Medications:     Medication List      CHANGE how you take these medications          lamoTRIgine 200 MG tablet   Commonly known as:  LaMICtal   Take 1 tablet (200 mg total) by mouth 2 (two) times daily.   What changed:    - medication strength  - how much to take  - when to take this         CONTINUE taking these medications          amphetamine-dextroamphetamine 30 MG tablet   Commonly known as:  ADDERALL       FLUoxetine 20 MG capsule   Commonly known as:  PROzac       metFORMIN 500 MG tablet   Commonly known as:  GLUCOPHAGE       spironolactone 50 MG tablet   Commonly known as:  ALDACTONE         STOP taking these medications          ALPRAZolam 0.5 MG tablet   Commonly known as:  XANAX         Where to Get Your Medications     These are the prescriptions that you need to pick up.         You may get the following medications from any pharmacy   -  lamoTRIgine 200 MG tablet                          Hospital Course   History of Present Illness and Hospital Course (1 Days)     Christine Andersen is a 29 y.o. female with   History of seizure, who was brought to [facility] ER after she had another episode of seizure.  She  recently moved from [place] to this area.  She has been  on Lamictal with 300 mg ER daily for her seizure.  In the ER,  she was noted to have multiple facial lacerations from the fall  as she hit a wooden furniture at home.  Oral Surgery was  consulted and Dr. Esau Grew sutured the facial  lacerations.  After she was admitted to the hospital, Neurology was consulted  and the EEG findings were consistent with a diagnosis of an  idiopathic/genetic generalized epilepsy.  Her current Lamictal  dose is felt to be relatively low, which was why Neurology  recommended to increase her Lamictal dose to 200 mg p.o. b.i.d.  With overall stable clinical condition, i.e., she has had no  recurrent seizure, she is subsequently discharged from the  hospital with a followup appointment with Neurology in two  weeks.  Both Neurology and myself advised the patient not to  drive for six months from the date of the last seizure.  Of note  MRI of the brain  was normal.                       Procedures/Imaging   MRI BRAIN WO CONTRAST   Final Result   1. Normal intracranial MRI scan of the head.   2.Mild sinus inflammatory changes as described above       ReadingStation:[id number]      CT Cervical Spine WO Contrast   Final Result   No evidence of acute cervical spine fracture or subluxation.          ReadingStation:[id number]      CT SINUS FACIAL BONES WO CONTRAST   Final Result   Soft tissue laceration left maxilla with fractures of the left and right nasal bones, and left periorbital and   supraorbital hematoma.   ReadingStation:[id number]      CT Head WO- Specify Condition/Reader   Final Result   1.  Left periorbital and supraorbital hematoma. No evidence of skull fracture or intracranial hemorrhage.   2.  Soft tissue lacerations in the left infraorbital region, and fractures of the left and right nasal bones.    ReadingStation:[email or web address]          Treatment Team:   Attending Provider: Marlou Sa, MD  Consulting Physician: Valorie Roosevelt, MD         Progress Note/Physical Exam at Discharge     Subjective: feel better    Filed Vitals:    09/13/14 0710 09/13/14 1100 09/13/14 1540 09/13/14 1620   BP: 112/65 116/69 108/81 123/66   Pulse: 76 95 71 84   Temp: 97.6 F (36.4 C) 97.4 F (36.3 C) 97.7 F (36.5 C) 98.1 F (36.7 C)    TempSrc: Oral Oral  Oral   Resp: 16 16 16 17    Height:       Weight:       SpO2: 99% 98%  98%       General Appearance: No apparent distress.   Eyes: no scleral icterus or conjunctival pallor  Mouth: MMM, no thrush  Cardiovascular: RRR, no murmurs  Respiratory: No increased work of breathing. CTA.  Gastrointestinal: Soft, +BS, non-tender  Skin: facial lacerations with sutures  Neurologic: no gross motor or sensory deficits  Psychiatric: alert and oriented, normal affect       Diagnostics     Labs/Studies Pending at Discharge: No    Last Labs     Recent Labs  Lab 09/13/14  0359 09/12/14  2043   WBC 15.7* 14.8*   RBC 4.72 5.01*   HEMOGLOBIN 13.2 14.5   HEMATOCRIT 40.8 43.1   MCV 87 86   PLT CT 370 405         Recent Labs  Lab 09/13/14  0359 09/12/14  2043   SODIUM 137 137   POTASSIUM 4.2 4.9   CHLORIDE 108 105   CO2 20.3 20.2   BUN 13 14   CREATININE 0.75 0.87   GLUCOSE 97 121*   CALCIUM 9.0 9.5   MAGNESIUM 2.1  --         Patient Instructions   Discharge Diet: regular diet  Discharge Activity:  activity as tolerated    Follow Up Appointment:  Follow-up Information     Follow up with [facility]. Call in 1 week.    Specialty:  Internal Medicine    Why:  help with establishing PCP/medical care     Contact information:    [address]  [place] [address]  [phone]  Additional information:    Located at [facility]        Follow up with Pcp, Noneorunknown, MD .        Follow up with Valorie Roosevelt, MD In 2 weeks.    Specialties:  Epilepsy, Neurology, Clinical Neurophysiology    Contact information:    [address]  [place] [address]  [phone]          Follow up with Alinda Dooms, DMD In 1 week.    Specialty:  Oral and Maxillofacial Surgery    Contact information:    [address]  [place] [address]  [phone]             Time spent examining patient, discussing with patient/family regarding hospital course, chart review, reconciling medications and discharge planning: 40  minutes.    Signed,  Durward Mallard, MD - I can be reached at [phone] or at Pager [phone])  09/13/2014 6:22 PM

## 2014-09-13 NOTE — Discharge Instr - Other Orders (Addendum)
Dr. Nolberto Hanlon surgeon   Patient to follow up in one week with Dr. Hinda Lenis for suture removal, [facility] [phone]

## 2014-10-06 ENCOUNTER — Ambulatory Visit
Admission: RE | Admit: 2014-10-06 | Discharge: 2014-10-06 | Disposition: A | Discharge status: Home / Self Care | Payer: BC Managed Care – PPO | Source: Ambulatory Visit

## 2014-10-06 ENCOUNTER — Other Ambulatory Visit: Payer: Self-pay

## 2014-10-06 ENCOUNTER — Ambulatory Visit: Payer: BC Managed Care – PPO | Attending: Family | Admitting: Family

## 2014-10-06 ENCOUNTER — Encounter: Payer: Self-pay | Admitting: Family

## 2014-10-06 VITALS — BP 116/71 | HR 90 | Temp 97.8°F | Resp 18 | Ht 65.0 in | Wt 220.1 lb

## 2014-10-06 DIAGNOSIS — Z87898 Personal history of other specified conditions: Secondary | ICD-10-CM

## 2014-10-06 DIAGNOSIS — R569 Unspecified convulsions: Secondary | ICD-10-CM | POA: Insufficient documentation

## 2014-10-06 DIAGNOSIS — Z09 Encounter for follow-up examination after completed treatment for conditions other than malignant neoplasm: Secondary | ICD-10-CM

## 2014-10-06 DIAGNOSIS — G40309 Generalized idiopathic epilepsy and epileptic syndromes, not intractable, without status epilepticus: Secondary | ICD-10-CM | POA: Insufficient documentation

## 2014-10-06 NOTE — Progress Notes (Signed)
Reason for Visit: Hospital follow care  and to help establish care with a PCP.    Assessment: Chart reviewed to help determine transition to appropriate provider. Patient  is insured with Rx coverage.Pt reports that she followed up with neurology  On 10/05/14 and also saw   Dr Hinda Lenis the oral surgeon  Plan: Potential options for transition to primary care discussed w/ the FNP.  Patient presented here on 10/06/14 for his follow up care. The patient was given options of providers who are in insurance network  with the patient's approval referral was called to [facility] and an appointment was secure for 10/28/14 to established primary care.   Out come : No additional follow up is scheduled for him in the transition clinic.   Chronic Care Management Progress Note     10/06/2014  5:54 PM  Care Team Member: Lynita Lombard Brae Gartman, RN  Patient Active Problem List    Diagnosis Date Noted   . Benzodiazepine abuse 09/13/2014   . Fall involving bed as cause of accidental injury in home as place of occurrence 09/12/2014   . Laceration of face, complex, initial encounter 09/12/2014   . Seizure disorder 09/12/2014   . Seizures 09/12/2014   . Fall against object, initial encounter 09/12/2014   . PCOS (polycystic ovarian syndrome) 09/12/2014   . Depression with anxiety 09/12/2014

## 2014-10-06 NOTE — Progress Notes (Signed)
Subjective:    Patient ID: Christine Andersen is a 29 y.o. female.  She presents to the transition clinic today for assistance with finding a primary care provider. She recently relocated from [place] to the [place] area to be closer to family and has not established with a PCP. She was admitted to the hospital from September 12, 2014 through September 13, 2014 following a seizure that she reports was witnessed by her family.  She reports she hit her face on wooden dresser drawer as she was having the seizure.  She reports she has followed up with oral surgery, Dr. Hinda Lenis; that all of her facial lacerations are healing and that all sutures have been removed; and that she had a root canal yesterday.  She reports she saw neurology, Dr. Sherilyn Cooter, yesterday, and that lab work was done to follow Lamictal level, and plan is for Dr. Rebecka Apley office to call lab results and refill her Lamictal; and that she follows with Dr. Sherilyn Cooter again in two months.  She reports she has called a GYN office to secure appointment for GYN care and contraception; reports she has been on oral birth control (Ortho-cyclen) consistently and that she takes Metformin for PCOS.    On 09-12-14, CT of the head:  IMPRESSION:   1. Left periorbital and supraorbital hematoma. No evidence of skull fracture or intracranial hemorrhage.  2. Soft tissue lacerations in the left infraorbital region, and fractures of the left and right nasal bones.     On 09-13-14, MRI of brain: IMPRESSION:   1. Normal intracranial MRI scan of the head.  2.Mild sinus inflammatory changes as described above     Since her hospital discharge, she denies any tremors, seizure activity or falls.   Reports isolated episode of headach on 09-17-14 with nausea; no active vomiting; no visual changes; using Tylenol for pain control.  She reports she has not been driving, and is aware she should not be driving until cleared by neurology; reports she is relying on family for transportation.    Reports she is taking Lamictal 200 mg by mouth BID consistently.  Reports she has adequate supply of her medications.     Reports her PMH is significant for onset of seizure in July 2015 and was following with neurologist in [place].   She denies any known history of diabetes, hypertension, thyroid disease, high cholesterol, liver disease or kidney disease.  Reports she followed with outpatient health behavioral health provider in [place] for ADHD, depression, and anxiety, and previously was on Xanax.  Surgery:  Reports breast reduction then "scar revision" following MRSA infection.   Reports tonsillectomy in the past.   Tobacco use: reports she stopped smoking cigarettes April 2015.  Reports rare intake of alcoholic beverage (glass of wine).  Denies illicit drug use.     HPI    Review of Systems   HENT: Negative for trouble swallowing.    Eyes: Negative for photophobia and visual disturbance.   Respiratory: Negative for shortness of breath.    Cardiovascular: Negative for chest pain.   Gastrointestinal: Negative for vomiting, abdominal pain, diarrhea, constipation and blood in stool.   Genitourinary: Negative for dysuria and difficulty urinating.   Musculoskeletal: Negative for gait problem.        Denies facial tenderness.    Neurological: Positive for headaches (reports isolated episode of headach on 09-17-14; denied seizure activity or vision changes; nausea, but no active vomiting). Negative for seizures, syncope, facial asymmetry and speech difficulty.  Psychiatric/Behavioral: Negative for suicidal ideas and self-injury. The patient is not nervous/anxious.            Objective:    Physical Exam   Constitutional: She is oriented to person, place, and time. She appears well-developed and well-nourished. No distress.   HENT:   Facial lacerations over left upper, orbital area, lip on left side and left side of nose are well-approximated; healing; no open areas noted; no sutures were seen.  No facial swelling or redness  noted.  No facial asymmetry noted.    Eyes: Conjunctivae are normal. Right eye exhibits no discharge. Left eye exhibits no discharge.   Neck: No tracheal deviation present.   No carotid bruits heard with auscultation.    Cardiovascular: Normal rate, regular rhythm, normal heart sounds and intact distal pulses.    Pulmonary/Chest: Effort normal and breath sounds normal. No stridor. No respiratory distress. She has no wheezes. She has no rales.   Abdominal: Soft. Bowel sounds are normal. There is no tenderness. There is no guarding.   Musculoskeletal: Normal range of motion.   MAEW. Ambulates without difficulty.    Lymphadenopathy:     She has no cervical adenopathy.   Neurological: She is alert and oriented to person, place, and time. Coordination normal.   Skin: Skin is warm and dry. No rash noted. She is not diaphoretic.   Psychiatric: She has a normal mood and affect. Her behavior is normal. Judgment and thought content normal.   Calm, sitting in chair; engaged in answering questions; making eye contact.    BP 116/71 mmHg  Pulse 90  Temp(Src) 97.8 F (36.6 C) (Oral)  Resp 18  Ht 1.651 m (5\' 5" )  Wt 99.837 kg (220 lb 1.6 oz)  BMI 36.63 kg/m2  SpO2 97%  LMP 09/14/2014        Assessment:       1. Hospital discharge follow-up    History of seizure      Plan:       Met with patient.  Reviewed importance of securing a primary care provider for consistent health care follow up and coordination of health care.  She verbalizes understanding.  The transition clinic called [facility] and patient now has an appointment there on   Oct 28, 2014 at 8:00am. She was instructed to arrive at least 15 minutes early for the appointment; take all of her medications in original pill bottles; and to complete intake paper work that [employer] may send her, and return it to their office at least 24 hours prior to her appointment.  Instructed not to drive or operate heavy equipment, until she is cleared to do so by  neurology. She verbalizes understanding.   Instructed her to keep her appointment with neurology, Dr. Sherilyn Cooter that she reports is scheduled in two months, and for her to follow up with his office for her lab results, and refill on her Lamictal.  Instructed her to keep her upcoming appointment that she reports she made with GYN provider, to review her contraception care and GYN screening/follow up.  Provided her with a list of outpatient behavioral health offices, if she feels she needs to follow with a provider for depression.   Reviewed signs to seek medical attention.  Printed out AVS; reviewed this with the patient and provided patient with a copy.  Reviewed seizure safety, for her to provide for her family members that she resides with.   Encouraged her to seek out support group for people  living with seizures.   Ensured that the patient has phone number to Transition clinic to call if questions arise, or if appointment at [facility] is needed.  Patient verbalizes understanding of plan of care.

## 2014-10-06 NOTE — Patient Instructions (Signed)
Continue to follow your hospital discharge instructions.    For primary care, the transition clinic is calling [facility] to see if they will be your primary care provider.  [facility] is located at [address] [place], [place] [address]. The phone number is [phone]. The transition clinic will let you know the details of your appointment with [facility] once we make contact with them.    Please keep your upcoming appointment with a GYN provider that you report you have the end of May.    Please keep your follow up appointment with Dr. Sherilyn Cooter.    No driving or operating equipment, until you are cleared by neurology.     Please review the list of outpatient behavioral health offices and call to make an appointment for outpatient behavioral health follow-up.    Please call the transition clinic if you have questions, or need an appointment before you get in with a primary care provider.  The phone number to the transition clinic:  [phone].    Please review the following:      Living Well with Epilepsy  People with epilepsycan lead healthy, productive lives. Life with epilepsy can be hard, but if you have this disease, you can still lead a healthy, productive life.You can do thingsto make it easier.For example, you can pay attention to your emotions. If you feel down, upset, or scared, talkto your health care provider. And be open with the people in your life. Talking about epilepsy can help them understand. It can also help you feel better.  Coping with emotions  You may be scared to go out in public for fear of having a seizure.Or you may just get frustrated with having epilepsy. Such feelings are normal. But they can lead to anxiety and depression. Treatment is available for these conditions, so talk to your health care provider. Discuss what can help you, such as the following:   Support groups give you the chance to talk with other people who have  epilepsy.   Counselinghelps you learn to cope with your emotions and health problems.   Medicationcan help if you have a mood disorder.    Coping at home  Epilepsy affects those around you, too. Talk with your loved ones and learn their concerns. For instance, your children may be afraid for your safety. Reassure them that you can live a long, healthy life with epilepsy. Your partner may wonder if a normal sex life is possible. Let him or her know that epilepsy doesn't have to affect intimacy. If loved ones have questions, you can always arrange a talk with your health care provider.  Epilepsy and your job  Epilepsy doesn't have to keep you from working. In fact, people with epilepsy hold many kinds of jobs. But there are some issues you should consider, such as:   What kind of work can I do?This depends on several things, such as how well controlled your seizures are. Also consider whether the job involves tasks that may not be safe for you. These include driving or operating heavy machinery.   Should I tell my boss or coworkers about my epilepsy?This is your personal choice. But you may be safer if people at your workplace are prepared to respond to a seizure. If you are concerned about losing your job, know your rights. The Americans with Disabilities Act provides work-related protections for people with epilepsy.     2000-2015 [employer] [address], [place], [place]  [address]. All rights reserved. This information is not intended as a substitute for professional medical care. Always follow your healthcare professional's instructions.      Epilepsy: Safety During a Seizure  Safety During a Seizure  Let family and friends know what to expect and how to react when youhave a seizure. This helps keep them calm and you safe. All seizures should be treated with care, but tonic-clonic seizures (seizures during which you lose consciousness) require more attention. Here are some pointers for  loved ones.  What to Know  Seizures typically last less than 3 minutes, but it will feel like it is longer.People recover safely from most seizures. During a tonic-clonic seizure, the person may appear to stop breathing or turn slightly blue. This may be scary for you, but try to stay calm. Afterward, the person may be tired, confused, and achy. He or she may need to sleep for several hours to fully recover.    What to Do  During any seizure, stay with the personuntil it is over. Note the time when the seizure starts and ends.Don't try to stop the seizure. During a tonic-clonic seizure, also do the following:   Move hard or sharp objects out of the way.   Lay the person on a flat surface and turn them on their side.   Place a flat, soft object under their head.   Don't try to restrain the person. Both of you could get hurt.   Don't put anything in the person's mouth. They cannot swallow their tongue, and you risk breaking their teeth or being bitten.   Don't give the person medications during a seizure, unless you've been trained by a health care provider.   Speak quietly to the person as they recover.     [address] [employer] [address] [place], [place] [address]. All rights reserved. This information is not intended as a substitute for professional medical care. Always follow your healthcare professional's instructions.

## 2014-10-08 LAB — LAMOTRIGINE LEVEL: Lamotrigine: 1.5 ug/mL — ABNORMAL LOW (ref 4.0–18.0)

## 2014-11-05 ENCOUNTER — Ambulatory Visit: Payer: BLUE CROSS/BLUE SHIELD | Admitting: Neurology

## 2014-11-05 DIAGNOSIS — Z029 Encounter for administrative examinations, unspecified: Secondary | ICD-10-CM

## 2014-11-08 ENCOUNTER — Encounter: Payer: Self-pay | Admitting: Neurology

## 2014-12-21 IMAGING — CT CT HEAD W/O CM
2 series · 15 of 30 positions shown, 19 images · non-contrast
Comparison: None.

CLINICAL DATA: Question of seizure. Found cuts on right hand, and
bit tongue. Shakiness.

EXAM:
CT HEAD WITHOUT CONTRAST
TECHNIQUE: Contiguous axial images were obtained from the base of the skull
through the vertex without intravenous contrast.

[Series 2: head w/o · axial · non-contrast · 0.43mm/px · z∈[+1260,+1405]mm · 13 of 35 slices shown, 17 images]
[im 3/35  brain]
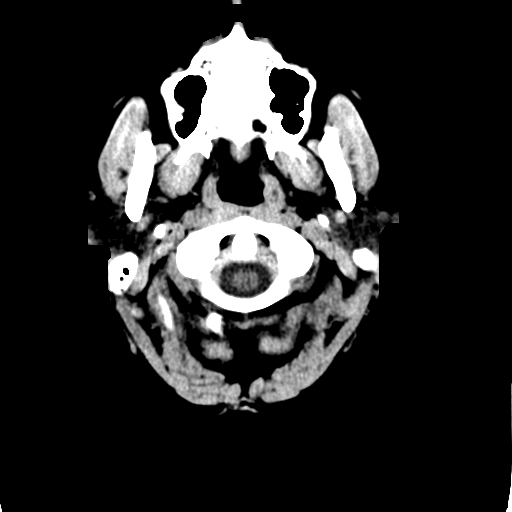
[im 3/35  bone]
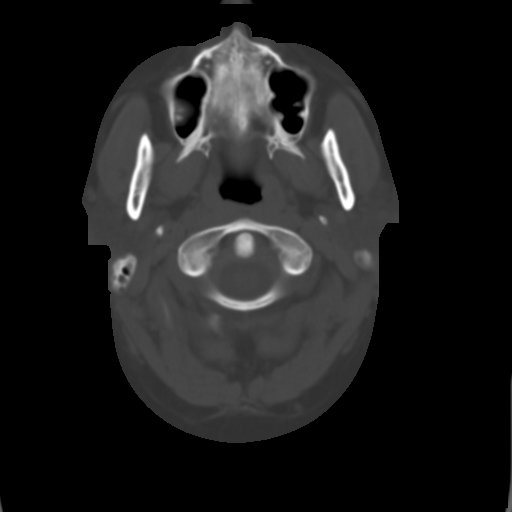
[im 5/35  brain]
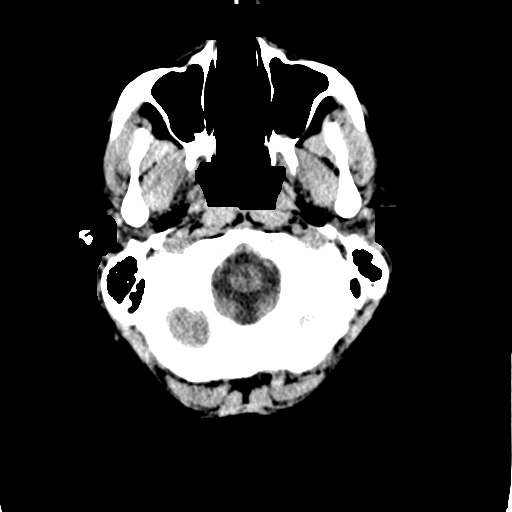
[im 8/35  brain]
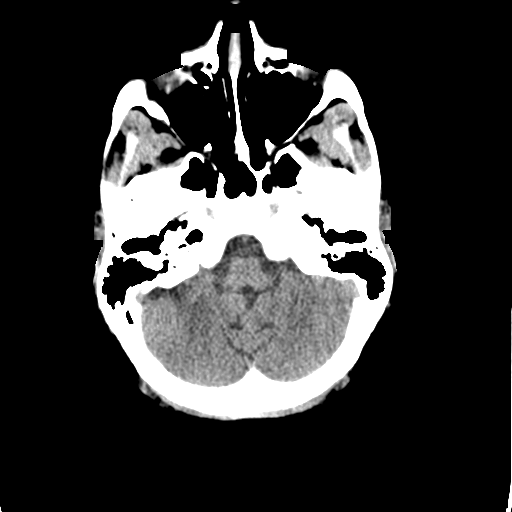
[im 10/35  brain]
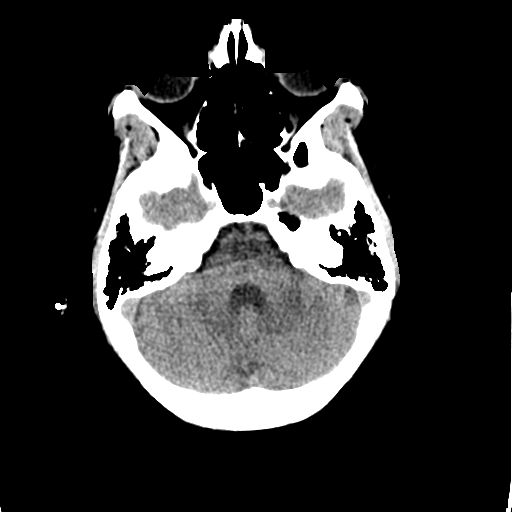
[im 13/35  brain]
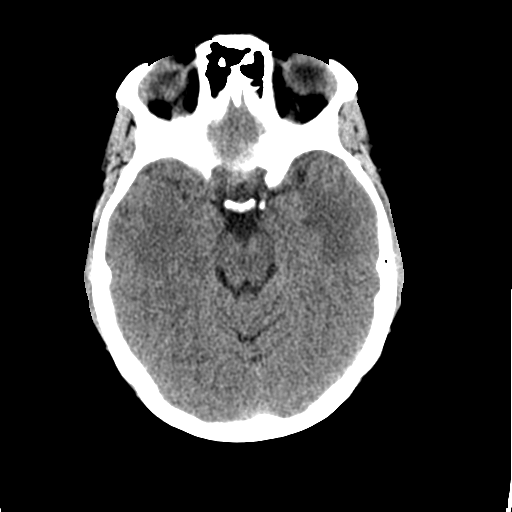
[im 13/35  bone]
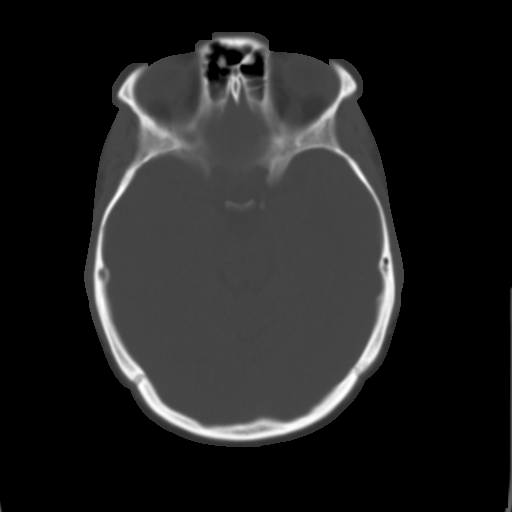
[im 15/35  brain]
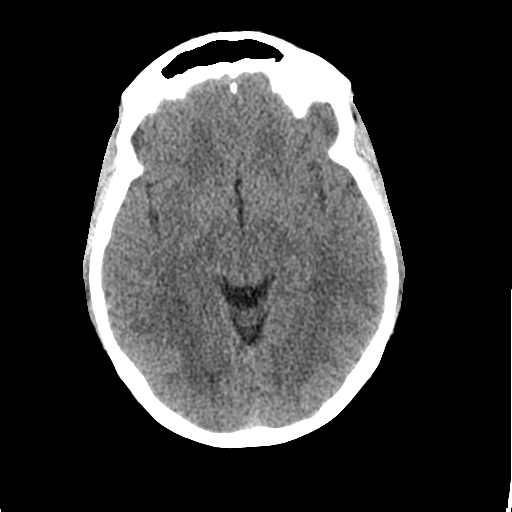
[im 18/35  brain]
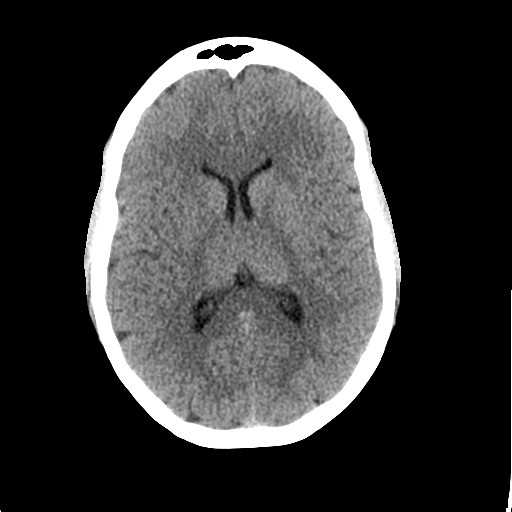
[im 20/35  brain]
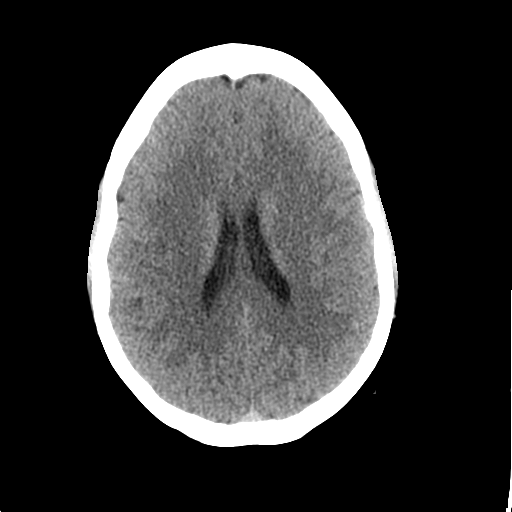
[im 22/35  brain]
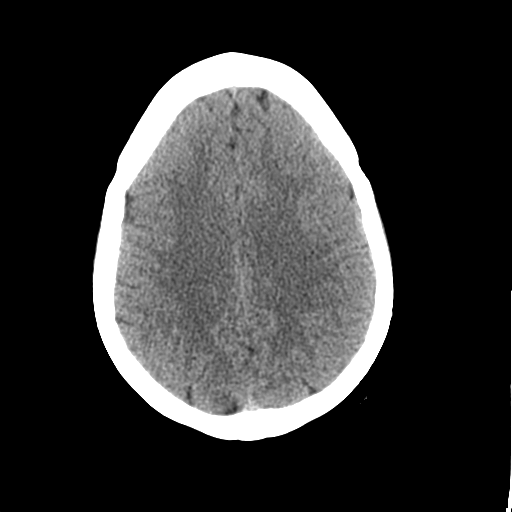
[im 22/35  bone]
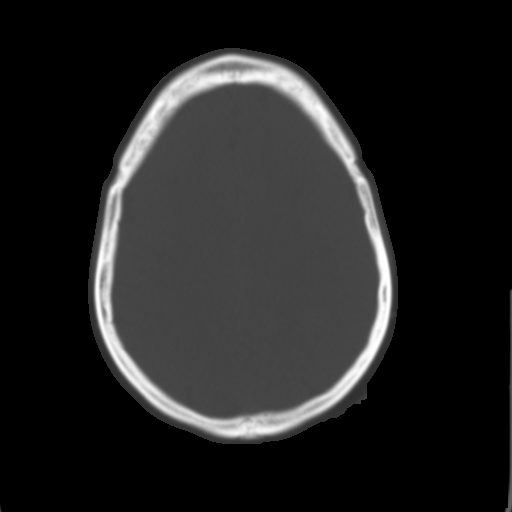
[im 25/35  brain]
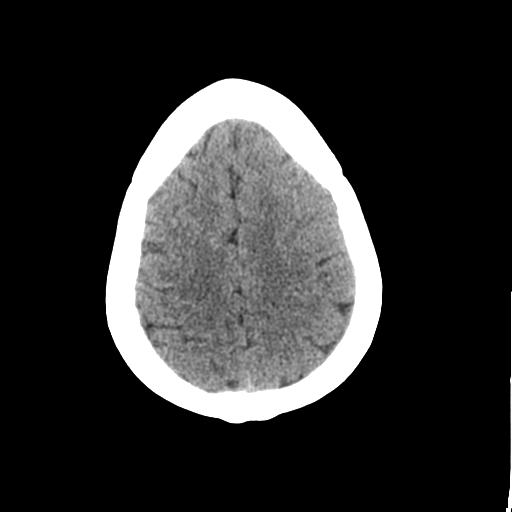
[im 27/35  brain]
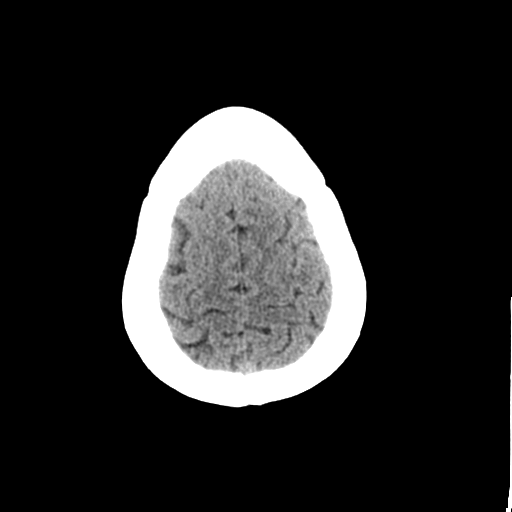
[im 30/35  brain]
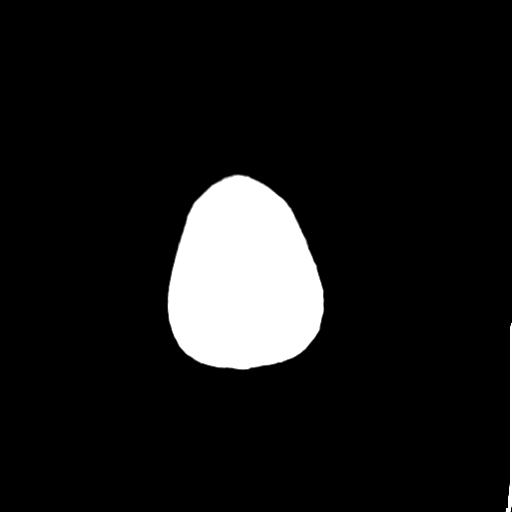
[im 32/35  brain]
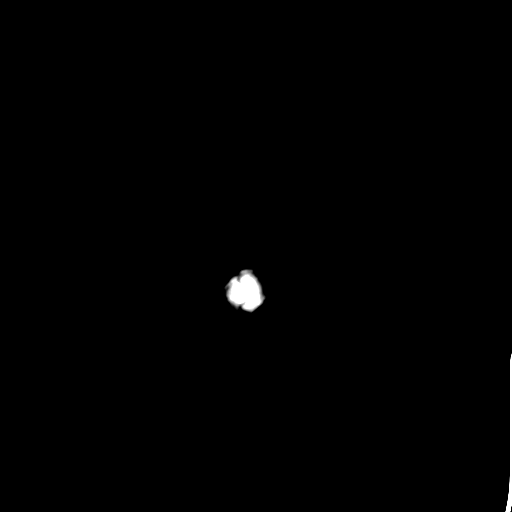
[im 32/35  bone]
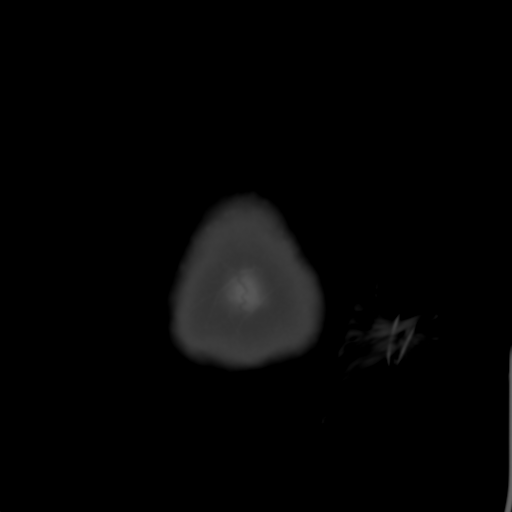

[Series 3: bone windows · axial · 0.43mm/px · z∈[+1260,+1285]mm · 2 of 35 slices shown]
[im 3/35  bone]
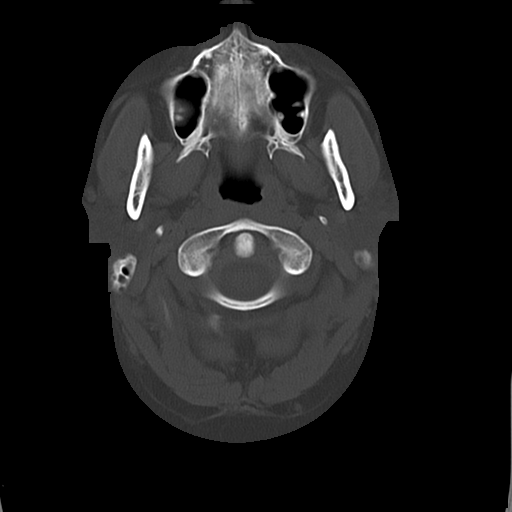
[im 8/35  bone]
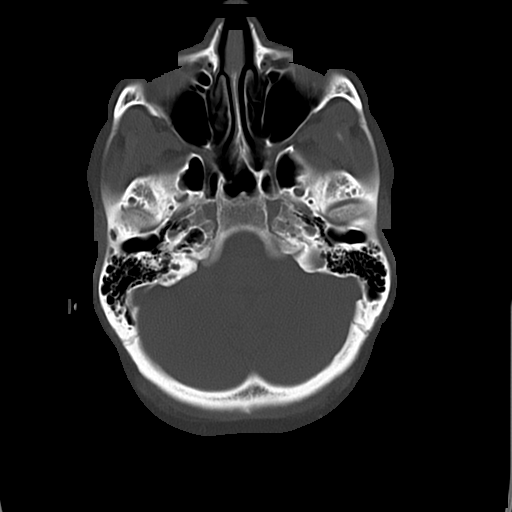

[15 of 30 positions shown; findings below may reference images not displayed]

FINDINGS: There is no evidence of acute infarction, mass lesion, or intra- or
extra-axial hemorrhage on CT.

The posterior fossa, including the cerebellum, brainstem and fourth
ventricle, is within normal limits. The third and lateral
ventricles, and basal ganglia are unremarkable in appearance. The
cerebral hemispheres are symmetric in appearance, with normal
gray-white differentiation. No mass effect or midline shift is seen.

There is no evidence of fracture; visualized osseous structures are
unremarkable in appearance. The orbits are within normal limits. The
paranasal sinuses and mastoid air cells are well-aerated. No
significant soft tissue abnormalities are seen.
IMPRESSION: Unremarkable noncontrast CT of the head.

## 2015-01-17 ENCOUNTER — Ambulatory Visit
Admission: RE | Admit: 2015-01-17 | Discharge: 2015-01-17 | Disposition: A | Discharge status: Home / Self Care | Payer: BC Managed Care – PPO | Source: Ambulatory Visit | Attending: Neurology | Admitting: Neurology

## 2015-01-17 DIAGNOSIS — G40309 Generalized idiopathic epilepsy and epileptic syndromes, not intractable, without status epilepticus: Secondary | ICD-10-CM | POA: Insufficient documentation

## 2015-01-18 LAB — LAMOTRIGINE LEVEL: Lamotrigine: 4.1 ug/mL (ref 4.0–18.0)

## 2018-01-22 ENCOUNTER — Emergency Department: Payer: No Typology Code available for payment source

## 2018-01-22 ENCOUNTER — Emergency Department
Admission: EM | Admit: 2018-01-22 | Discharge: 2018-01-22 | Disposition: A | Discharge status: Home / Self Care | Payer: No Typology Code available for payment source | Attending: Emergency Medicine | Admitting: Emergency Medicine

## 2018-01-22 DIAGNOSIS — M25552 Pain in left hip: Secondary | ICD-10-CM | POA: Insufficient documentation

## 2018-01-22 DIAGNOSIS — X500XXA Overexertion from strenuous movement or load, initial encounter: Secondary | ICD-10-CM | POA: Insufficient documentation

## 2018-01-22 LAB — URINE HCG QUALITATIVE: Urine HCG Qualitative: NEGATIVE

## 2018-01-22 MED ORDER — ACETAMINOPHEN 500 MG PO TABS
1000.0000 mg | ORAL_TABLET | Freq: Once | ORAL | Status: AC
Start: 2018-01-22 — End: 2018-01-22
  Administered 2018-01-22: 1000 mg via ORAL

## 2018-01-22 MED ORDER — ACETAMINOPHEN 500 MG PO TABS
ORAL_TABLET | ORAL | Status: AC
Start: 2018-01-22 — End: ?
  Filled 2018-01-22: qty 2

## 2018-01-22 NOTE — Discharge Instructions (Signed)
Hip Strain    You have a strain of the muscles around the hip joint. A muscle strain is a stretching or tearing of muscle fibers. This causes pain, especially when you move that muscle. There may also be some swelling and bruising.  Home care   Stay off the injured leg as much as possible until you can walk on it without pain. If you have a lot of pain with walking, crutches or a walker may be prescribed. These can be rented or purchased at many pharmacies and surgical or orthopedic supply stores. Follow your healthcare provider's advice about when to start putting weight on that leg.   Apply an ice pack over the injured area for 15 to 20 minutes every 3 to 6 hours. Do this for the first 24 to 48 hours. You can make an ice pack by filling a plastic bag that seals at the top with ice cubes and then wrapping it with a thin towel. Be careful not to injure your skin with the ice treatments. Ice should never be applied directly to skin. Continue the use of ice packs for relief of pain and swelling as needed. After 48 hours, apply heat (warm shower or warm bath) for 15 to 20 minutes several times a day, or alternate ice and heat.   You may use over-the-counter pain medicine to control pain, unless another pain medicine was prescribed. If you have chronic liver or kidney disease or ever had a stomach ulcer or gastrointestinal bleeding, talk with your healthcare provider before using these medicines.   If you play sports, you may resume these activities when you are able to hop and run on the injured leg without pain.  Follow-up care  Follow up with your healthcare provider, or as advised. If your symptoms don't start to get better after a week, more tests may be needed.  If X-rays were taken, you will be told of any new findings that may affect your care.  When to seek medical advice  Call your healthcare provider right away if any of these occur:   Increased swelling or bruising   Increased pain   Losing the  ability to put weight on the injured side  Date Last Reviewed: 10/02/2016   2000-2019 [employer] [address], [place] [address]. All rights reserved. This information is not intended as a substitute for professional medical care. Always follow your healthcare professional's instructions.

## 2018-01-22 NOTE — ED Notes (Signed)
Resident at bedside.  

## 2018-01-22 NOTE — ED Provider Notes (Signed)
Physician/Midlevel provider first contact with patient: 01/22/18 0752       [facility] EMERGENCY DEPARTMENT  History and Physical Exam      Patient Name: Christine Andersen, Christine Andersen  Encounter Date:  01/22/2018  PCP: Phineas Douglas, DO  Patient DOB:  24-Oct-1985  MRN:  [id number]  Room:  3/ED3-A      History of Presenting Illness     Chief complaint: Hip Pain      Christine Andersen is a 32 y.o. female who presents with left hip pain.  Pain began when she woke up 2 days ago.  She had gotten a new mattress recently, and at first thought that was the cause.  However, it has progressively worsened.  This morning she is unable to weight-bear on her own.  Pain is located on the anterior aspect.  It is exacerbated by movement and weightbearing.  She describes it as sharp and shooting.  She tried icing it yesterday, however that did not help.  No known trauma or falls.  No pain anywhere else.  Last night, the pain was severe enough that it prevented her from sleeping much. She cannot get comfortable on the new mattress which was a "mattress-in-a--box."      Review of Systems     Review of Systems   Constitutional: Negative.  Negative for fever.   Gastrointestinal: Negative for abdominal pain, blood in stool, constipation, nausea and vomiting.   Genitourinary: Negative.    Musculoskeletal: Positive for joint pain. Negative for back pain and falls.   Skin: Negative.    Neurological: Negative for tingling, sensory change and focal weakness.   All other systems reviewed and are negative.      Allergies     Pt is allergic to keppra [levetiracetam].    Medications     No current facility-administered medications for this encounter.     Current Outpatient Prescriptions:   .  amphetamine-dextroamphetamine (ADDERALL) 30 MG tablet, Take 30 mg by mouth 2 (two) times daily., Disp: , Rfl:   .  lamoTRIgine (LAMICTAL) 200 MG tablet, Take 1 tablet (200 mg total) by mouth 2 (two) times daily., Disp: 60 tablet, Rfl: 0  .   metFORMIN (GLUCOPHAGE) 500 MG tablet, Take 500 mg by mouth 3 (three) times daily., Disp: , Rfl:      Past Medical History     Pt has a past medical history of ADHD (attention deficit hyperactivity disorder); Anxiety; Depression; PCOS (polycystic ovarian syndrome); and Seizures.    Past Surgical History     Pt has a past surgical history that includes Tonsillectomy; Revision of scar (Bilateral); and Reduction mammaplasty (Bilateral, 2009).    Family History     The family history is not on file.    Social History     Pt reports that she quit smoking about 4 years ago. Her smoking use included Cigarettes. She smoked 0.25 packs per day. She has never used smokeless tobacco. She reports that she drinks alcohol. She reports that she does not use drugs.    Physical Exam     Blood pressure 109/60, pulse 91, temperature 98.4 F (36.9 C), temperature source Oral, resp. rate 15, height 1.651 m, weight 81.6 kg, last menstrual period 12/30/2017, SpO2 99 %.    Physical Exam   Constitutional: She appears well-developed and well-nourished. No distress.   Reclined comfortably in bed   Cardiovascular: Intact distal pulses.    Musculoskeletal: She exhibits no edema, tenderness or deformity.  Right hip: Normal.        Left hip: She exhibits decreased strength (Resisted hip flexion is painful). She exhibits no tenderness, no swelling, no crepitus and no deformity.   Able to stand.  Painful-appearing gait.   Skin: Skin is warm and dry. She is not diaphoretic.       Orders Placed     Orders Placed This Encounter   Procedures   . XR Hip Left AP And Lateral   . HCG, Qualitative, Urine       ED Medication Orders     Start Ordered     Status Ordering Provider    01/22/18 [id number] 01/22/18 0820  acetaminophen (TYLENOL) tablet 1,000 mg  Once in ED     Route: Oral  Ordered Dose: 1,000 mg     Last MAR action:  Given FRENCH, ANDREW T          Diagnostic Results       The results of the diagnostic studies below have been reviewed by  myself:    Labs  Results     Procedure Component Value Units Date/Time    HCG, Qualitative, Urine [[id number]] Collected:  01/22/18 0829    Specimen:  Urine, Random Updated:  01/22/18 0849     human chorionic gonadotropin (hCG), UR, Qual. Negative          Radiologic Studies  Radiology Results (24 Hour)     Procedure Component Value Units Date/Time    XR Hip Left AP And Lateral [[id number]] Collected:  01/22/18 0923    Order Status:  Completed Updated:  01/22/18 0927    Narrative:       Clinical History:  Left hip pain and weakness    Examination:  AP and lateral/frog leg views of the left hip.    Comparison:  None available.    Findings:  No acute skeletal or soft tissue abnormalities are seen and there is no visible degenerative change in the hips. There does appear to be mild degenerative change along the SI joints.      Impression:       No acute abnormality with mild chronic changes as above.    ReadingStation:[id number]          Last EKG Result     None            MDM / Critical Care     MDM  Number of Diagnoses or Management Options  Acute hip pain, left: new, needed workup     Amount and/or Complexity of Data Reviewed  Clinical lab tests: reviewed  Tests in the radiology section of CPT: reviewed    Risk of Complications, Morbidity, and/or Mortality  Presenting problems: low  Diagnostic procedures: low  Management options: low    Patient Progress  Patient progress: improved           Procedures       Diagnosis / Disposition     Clinical Impression  1. Acute hip pain, left        Disposition  ED Disposition     None          Prescriptions  New Prescriptions    No medications on file       Multidisciplinary Visit: Provider(s) to Follow Up With            [facility] Emergency Department   Specialty:  Emergency Medicine    [address] [place] [address]   Phone:  [phone]  Next Steps:  Follow up    Instructions:  If symptoms worsen    Eduard Clos, DO   Specialty:  Orthopaedic Surgery    [facility]  [address] [place]  [address] [place] [address]   Phone:  [phone]       Next Steps:  Schedule an appointment as soon as possible for a visit in 1 week(s)    Instructions:  If still having pain        Patient advised to treat this with rest, ice, and over-the-counter pain medicine.  She is feeling better after receiving Tylenol.    Roma Kayser, DO  [facility] Residency, PGY2           Ron Parker, DO  Resident  01/22/18 [phone]    Attending Physician Attestation:     I was present during or personally performed key portions of the service documented above by Dr. Maxine Glenn, the resident physician. I discussed with the resident, and was directly involved in the management of the patient. I agree with the note as written with the following notes:    I saw this very pleasant 32 year old patient after she is Artie seen Dr. Janice Norrie and was already feeling better with the Tylenol that she had been given.  She noted right hip pain in the morning after she used a new mattress for the first time, and is been getting worse every time she lies on the mattress or tries to sleep on the mattress.  She has pain with flexion extension of the hip.  The hip itself is not been swollen or erythematous or warm.  Bearing weight is painful she can do it.  No back pain or groin pain, no numbness weakness tingling of her lower extremities.  She has not had any falls or trauma.  She does not have any other joint pain, and feels otherwise well.    Her exam was significant for mild tenderness palpation of the lateral and anterior hip as well as the proximal anterior thigh.  Pain is reproduced by walking or by flexion or extension of the hip.  There is no joint effusion.  There is no point or bony tenderness to palpation.  She was able to move the hip fairly freely after the Tylenol.  She did have some pain with external rotation on exam, but no impingement.     X-ray was done which was negative,  she was advised to rest apply ice Tylenol and Motrin and follow-up with orthopedics if it was not resolving.  She was happy with this plan she was already feeling much better after the Tylenol and was able to bear weight easily.  I also told her return to emergency room if she had any worsening of condition.         Nicolasa Ducking, MD  01/22/18 [phone]

## 2020-09-28 ENCOUNTER — Ambulatory Visit (INDEPENDENT_AMBULATORY_CARE_PROVIDER_SITE_OTHER): Payer: Commercial Managed Care - POS | Admitting: Family

## 2020-09-28 ENCOUNTER — Encounter (INDEPENDENT_AMBULATORY_CARE_PROVIDER_SITE_OTHER): Payer: Self-pay

## 2020-09-28 VITALS — BP 126/79 | HR 86 | Temp 98.9°F | Resp 16 | Ht 64.0 in | Wt 180.0 lb

## 2020-09-28 DIAGNOSIS — J029 Acute pharyngitis, unspecified: Secondary | ICD-10-CM

## 2020-09-28 LAB — POCT RAPID STREP A: Rapid Strep A Screen POCT: NEGATIVE

## 2020-09-28 NOTE — Progress Notes (Signed)
Subjective:    Patient ID:   Christine Andersen is a 35 y.o. female patient who presents today with complaints of a sore throat that started on Saturday. She states that she was outside late on Friday. She denies known exposure to Covid, strep, and the flu. She works from home. She has a history of strep throat infections, but had her tonsils removed in 2015 and has not had a strep infection since that time. She has treated her symptoms with Claritin, Dayquil and Nyquil thus far.      The following portions of the patient's history were reviewed and updated as appropriate: allergies, current medications, past medical history, past surgical history and problem list.    Review of Systems   Constitutional: Negative for chills, fatigue and fever.   HENT: Positive for congestion, postnasal drip, rhinorrhea and sore throat. Negative for sinus pressure and sinus pain.    Respiratory: Positive for cough. Negative for chest tightness, shortness of breath and wheezing.    Gastrointestinal: Positive for nausea and vomiting.   Musculoskeletal: Negative for myalgias.   Neurological: Negative for headaches.       Objective:     Vitals:    09/28/20 1537   BP: 126/79   Pulse: 86   Resp: 16   Temp: 98.9 F (37.2 C)       Physical Exam  Constitutional:       General: She is not in acute distress.     Appearance: Normal appearance. She is not ill-appearing or toxic-appearing.   HENT:      Right Ear: Tympanic membrane normal.      Left Ear: Tympanic membrane normal.      Mouth/Throat:      Mouth: Mucous membranes are moist.      Pharynx: Posterior oropharyngeal erythema present.   Eyes:      Conjunctiva/sclera: Conjunctivae normal.      Pupils: Pupils are equal, round, and reactive to light.   Cardiovascular:      Rate and Rhythm: Normal rate.      Heart sounds: Normal heart sounds.   Pulmonary:      Effort: Pulmonary effort is normal. No respiratory distress.      Breath sounds: No stridor. No wheezing, rhonchi or rales.    Lymphadenopathy:      Cervical: No cervical adenopathy.   Skin:     General: Skin is warm and dry.   Neurological:      Mental Status: She is alert.   Psychiatric:         Mood and Affect: Mood normal.         Behavior: Behavior normal.          Lab Results from today's visit:  Results     Procedure Component Value Units Date/Time    POCT Rapid Group A Strep [[id number]]  (Normal) Collected: 09/28/20 1539    Specimen: Throat Updated: 09/28/20 1551     POCT QC Pass     Rapid Strep A Screen POCT Negative      Comment Negative Results should be confirmed by throat Cx to confirm absence of Strep A inf.          Assessment and Plan:   Christine Andersen was seen today for sore throat.    Diagnoses and all orders for this visit:    Pharyngitis, unspecified etiology  -     POCT Rapid Group A Strep    PLAN:  -The patient has tested negative for  strep throat.  Her symptoms are likely due to an allergic etiology.  -Initiate symptomatic treatment including pushing fluids, resting, and gargling with warm salt water.   -Take over the counter medications such as chloraseptic throat spray/lozenges, ibuprofen, or tylenol as directed on the packaging for throat pain.   -Take a daily antihistamine and Flonase.    Raina Mina, NP  [facility]  09/28/2020 4:02 PM

## 2020-11-25 ENCOUNTER — Emergency Department (EMERGENCY_DEPARTMENT_HOSPITAL)
Admission: EM | Admit: 2020-11-25 | Discharge: 2020-11-25 | Disposition: A | Discharge status: Home / Self Care | Payer: Commercial Managed Care - POS | Source: Home / Self Care | Attending: Emergency Medicine | Admitting: Emergency Medicine

## 2020-11-25 ENCOUNTER — Emergency Department: Payer: Commercial Managed Care - POS

## 2020-11-25 ENCOUNTER — Emergency Department
Admission: EM | Admit: 2020-11-25 | Discharge: 2020-11-25 | Disposition: A | Discharge status: Home / Self Care | Payer: Commercial Managed Care - POS | Attending: Emergency Medicine | Admitting: Emergency Medicine

## 2020-11-25 DIAGNOSIS — O26851 Spotting complicating pregnancy, first trimester: Secondary | ICD-10-CM

## 2020-11-25 DIAGNOSIS — N939 Abnormal uterine and vaginal bleeding, unspecified: Secondary | ICD-10-CM

## 2020-11-25 DIAGNOSIS — Z3A14 14 weeks gestation of pregnancy: Secondary | ICD-10-CM

## 2020-11-25 DIAGNOSIS — O039 Complete or unspecified spontaneous abortion without complication: Secondary | ICD-10-CM | POA: Insufficient documentation

## 2020-11-25 DIAGNOSIS — O209 Hemorrhage in early pregnancy, unspecified: Secondary | ICD-10-CM

## 2020-11-25 DIAGNOSIS — Z3A00 Weeks of gestation of pregnancy not specified: Secondary | ICD-10-CM

## 2020-11-25 LAB — HCG QUANTITATIVE
BHCG Quant.: 8727.2 m[IU]/mL
BHCG Quant.: 6820.4 m[IU]/mL

## 2020-11-25 LAB — CBC AND DIFFERENTIAL
Basophils %: 0.8 % (ref 0.0–3.0)
Basophils %: 0.4 % (ref 0.0–3.0)
Basophils Absolute: 0.1 10*3/uL (ref 0.0–0.3)
Basophils Absolute: 0 10*3/uL (ref 0.0–0.3)
Eosinophils %: 2.1 % (ref 0.0–7.0)
Eosinophils %: 0.8 % (ref 0.0–7.0)
Eosinophils Absolute: 0.2 10*3/uL (ref 0.0–0.8)
Eosinophils Absolute: 0.1 10*3/uL (ref 0.0–0.8)
Hematocrit: 41.6 % (ref 36.0–48.0)
Hematocrit: 40.6 % (ref 36.0–48.0)
Hemoglobin: 13.7 gm/dL (ref 12.0–16.0)
Hemoglobin: 13.6 gm/dL (ref 12.0–16.0)
Lymphocytes Absolute: 2.1 10*3/uL (ref 0.6–5.1)
Lymphocytes Absolute: 2.8 10*3/uL (ref 0.6–5.1)
Lymphocytes: 24.1 % (ref 15.0–46.0)
Lymphocytes: 26.5 % (ref 15.0–46.0)
MCH: 30 pg (ref 28–35)
MCH: 30 pg (ref 28–35)
MCHC: 33 gm/dL (ref 32–36)
MCHC: 34 gm/dL (ref 32–36)
MCV: 90 fL (ref 80–100)
MCV: 90 fL (ref 80–100)
MPV: 7.6 fL (ref 6.0–10.0)
MPV: 7.4 fL (ref 6.0–10.0)
Monocytes Absolute: 0.5 10*3/uL (ref 0.1–1.7)
Monocytes Absolute: 0.5 10*3/uL (ref 0.1–1.7)
Monocytes: 5.5 % (ref 3.0–15.0)
Monocytes: 4.8 % (ref 3.0–15.0)
Neutrophils %: 67.6 % (ref 42.0–78.0)
Neutrophils %: 67.5 % (ref 42.0–78.0)
Neutrophils Absolute: 5.8 10*3/uL (ref 1.7–8.6)
Neutrophils Absolute: 7.2 10*3/uL (ref 1.7–8.6)
PLT CT: 326 10*3/uL (ref 130–440)
PLT CT: 398 10*3/uL (ref 130–440)
RBC: 4.62 10*6/uL (ref 3.80–5.00)
RBC: 4.51 10*6/uL (ref 3.80–5.00)
RDW: 11.7 % (ref 11.0–14.0)
RDW: 11.6 % (ref 11.0–14.0)
WBC: 8.5 10*3/uL (ref 4.0–11.0)
WBC: 10.6 10*3/uL (ref 4.0–11.0)

## 2020-11-25 LAB — ABO/RH: ABO Rh: O POS

## 2020-11-25 MED ORDER — HYDROMORPHONE HCL 0.5 MG/0.5 ML IJ SOLN
0.5000 mg | Freq: Once | INTRAMUSCULAR | Status: AC
Start: 2020-11-25 — End: 2020-11-25
  Administered 2020-11-25: 0.5 mg via INTRAVENOUS

## 2020-11-25 MED ORDER — HYDROMORPHONE HCL 0.5 MG/0.5 ML IJ SOLN
INTRAMUSCULAR | Status: AC
Start: 2020-11-25 — End: ?
  Filled 2020-11-25: qty 0.5

## 2020-11-25 MED ORDER — ONDANSETRON HCL 4 MG/2ML IJ SOLN
4.0000 mg | Freq: Once | INTRAMUSCULAR | Status: AC
Start: 2020-11-25 — End: 2020-11-25
  Administered 2020-11-25: 4 mg via INTRAVENOUS

## 2020-11-25 MED ORDER — KETOROLAC TROMETHAMINE 10 MG PO TABS
10.0000 mg | ORAL_TABLET | Freq: Three times a day (TID) | ORAL | 0 refills | Status: DC | PRN
Start: 2020-11-25 — End: 2021-09-05

## 2020-11-25 MED ORDER — MORPHINE SULFATE 4 MG/ML IJ/IV SOLN (WRAP)
4.0000 mg | Freq: Once | Status: AC | PRN
Start: 2020-11-25 — End: 2020-11-25
  Administered 2020-11-25: 4 mg via INTRAVENOUS

## 2020-11-25 MED ORDER — ONDANSETRON HCL 4 MG/2ML IJ SOLN
INTRAMUSCULAR | Status: AC
Start: 2020-11-25 — End: ?
  Filled 2020-11-25: qty 2

## 2020-11-25 MED ORDER — MORPHINE SULFATE 4 MG/ML IJ/IV SOLN (WRAP)
Status: AC
Start: 2020-11-25 — End: ?
  Filled 2020-11-25: qty 1

## 2020-11-25 NOTE — ED Provider Notes (Signed)
[facility]  EMERGENCY DEPARTMENT  History and Physical Exam     Patient Name: Christine Andersen, Christine Andersen  Encounter Date:  11/25/2020  Supervising Physician: Edrick Kins., *   Treating Provider: Cathi Roan NP  Room:  E52/E52-A  Patient DOB:  01/02/1986  Age: 35 y.o. female  MRN:  [id number]  PCP: Phineas Douglas, DO      Diagnosis/Disposition:  MDM:     Final Impression  1. Miscarriage    2. Vaginal bleeding        Disposition  ED Disposition       ED Disposition   Discharge    Condition   --    Date/Time   Fri Nov 25, 2020  7:50 PM    Comment   Arrion Burruel discharge to home/self care.    Condition at disposition: Stable                 Follow up  Cresenciano Lick, MD  [address]  [place] [address]  [phone]    Schedule an appointment as soon as possible for a visit       [facility] Emergency Department  [address]  [place] [address]  [phone]    As needed, If symptoms worsen, fever, heavy bleeding or severe pain    Prescriptions  New Prescriptions    No medications on file     ED Summary:    Differential diagnosis includes but is not limited to miscarriage, anemia, retained products of conception    Patient overall well-appearing some emotional distress consistent with presentation.  Presentation is consistent with miscarriage.  She describes heavy bleeding earlier however H&H labs are stable here beta-hCG is declining.  Some echogenic material on ultrasound but it does seem that the patient has completed most of the miscarriage.  During stay in the ER her pain improved with medicine, her vaginal bleeding has decreased significantly with only a small amount of bleeding now and cramping mostly resolved.    Discussed management inpatient versus outpatient follow-up return precautions and patient was discharged in excellent condition.    Discussed presentation, work-up and plan with my attending who also saw the patient in triage, and is in agreement.       At the  time of disposition, after discussion with the pt of all findings, considered differential and their implications, as well as signs of worsening, and their potential implications, patient/family verbalizes understanding and is amenable to treatment plan, and has no concerns at this time, all questions answered.      The patient's past medical records, including those in Care Everywhere when necessary, were reviewed by me    The results of diagnostic studies have been reviewed by myself. Available past medical, family, social, and surgical histories have been reviewed by myself. The clinical impression and plan have been discussed with the patient and/or the patient's family. All questions have been answered.      History of Presenting Illness:     Nurse Triage: in iwth abd pain and vag bleeding post miscariage    Chief complaint: Vaginal Bleeding        HPI/ROS given by: patient, unless otherwise stated    Christine Andersen is a 35 y.o. female who is G1, P0 with last menstrual period of 08/05/2020 expected to be 10-[redacted] weeks pregnant presenting with vaginal bleeding and pain in pelvis/abdomen lower.  She notes she was seen here earlier today and told she was  having miscarriage.  She had heavier bleeding passing large clots and severe pain so she returned.     No fevers, chills, shortness of breath, chest pain, lightheadedness.        Review of Systems:  Physical Exam:     Review of Systems   Constitutional:  Negative for chills and fever.   HENT:  Negative for congestion and sore throat.    Eyes:  Negative for visual disturbance.   Respiratory:  Negative for cough and shortness of breath.    Cardiovascular:  Negative for chest pain.   Gastrointestinal:  Negative for abdominal pain, diarrhea, nausea and vomiting.   Genitourinary:  Positive for pelvic pain and vaginal bleeding. Negative for dysuria.   Musculoskeletal:  Negative for myalgias.   Skin:  Negative for rash.   Neurological:  Negative for headaches.   Blood  pressure 110/76, pulse 93, temperature 98.3 F (36.8 C), temperature source Oral, resp. rate 19, weight 108 kg, last menstrual period 09/12/2020, SpO2 96 %.     Physical Exam  Constitutional:       General: She is in acute distress (mild situationally approp emotional).      Appearance: She is well-developed.   HENT:      Head: Normocephalic and atraumatic.   Eyes:      General:         Right eye: No discharge.         Left eye: No discharge.      Pupils: Pupils are equal, round, and reactive to light.   Cardiovascular:      Rate and Rhythm: Normal rate and regular rhythm.      Pulses: Normal pulses.      Heart sounds: Normal heart sounds. No murmur heard.    No friction rub. No gallop.   Pulmonary:      Effort: Pulmonary effort is normal. No accessory muscle usage.      Breath sounds: Normal breath sounds.   Abdominal:      Palpations: Abdomen is soft. There is no mass.      Tenderness: no abdominal tenderness   Genitourinary:     Comments: Tampon in place with scant blood with pink string, there is trace patches of blood on the pad without clots.  Musculoskeletal:         General: No deformity. Normal range of motion.      Cervical back: Normal range of motion.   Skin:     General: Skin is warm and dry.      Findings: No rash.   Neurological:      Mental Status: She is alert and oriented to person, place, and time.           Past History:     Medical: Pt has a past medical history of ADHD (attention deficit hyperactivity disorder), Anxiety, Depression, PCOS (polycystic ovarian syndrome), and Seizures.    Surgical: Pt  has a past surgical history that includes Tonsillectomy; Revision of scar (Bilateral); and Reduction mammaplasty (Bilateral, 2009).    Family: The family history is not on file.    Social: Pt reports that she quit smoking about 7 years ago. Her smoking use included cigarettes. She smoked an average of 0.25 packs per day. She has never used smokeless tobacco. She reports current alcohol use. She  reports that she does not use drugs.       Diagnostic Results:     RADIOLOGIC STUDIES    All images have been personally  viewed by me    US OB < 14 Weeks    Result Date: 11/25/2020  No evidence of intrauterine pregnancy. Probable blood clot in lower uterine segment. ReadingStation:[id number]     LAB STUDIES    All lab value have been personally reviewed by me    Results       Procedure Component Value Units Date/Time    Beta HCG Quantitative [[id number]] Collected: 11/25/20 1650    Specimen: Plasma Updated: 11/25/20 1732     BHCG Quant. 6,820.4 mIU/mL     CBC and differential [[id number]] Collected: 11/25/20 1650    Specimen: Blood Updated: 11/25/20 1715     WBC 10.6 K/cmm      RBC 4.51 M/cmm      Hemoglobin 13.6 gm/dL      Hematocrit 86.5 %      MCV 90 fL      MCH 30 pg      MCHC 34 gm/dL      RDW 78.4 %      PLT CT 398 K/cmm      MPV 7.4 fL      Neutrophils % 67.5 %      Lymphocytes 26.5 %      Monocytes 4.8 %      Eosinophils % 0.8 %      Basophils % 0.4 %      Neutrophils Absolute 7.2 K/cmm      Lymphocytes Absolute 2.8 K/cmm      Monocytes Absolute 0.5 K/cmm      Eosinophils Absolute 0.1 K/cmm      Basophils Absolute 0.0 K/cmm                 EKG:    Reviewed at the time of assessment, or that it was made available to me.   EKG: Last EKG Result       None          All EKG reviewed by an attending immediately after completion per ED policy.     PROCEDURES          ORDERS PLACED THIS VISIT     Orders  Orders Placed This Encounter   Procedures    CBC and differential    Beta HCG Quantitative    US OB < 14 Weeks       Medications  Medications   ondansetron (ZOFRAN) injection 4 mg (4 mg Intravenous Given 11/25/20 1654)   morphine injection 4 mg (4 mg Intravenous Given 11/25/20 1654)   HYDROmorphone (DILAUDID) injection 0.5 mg (0.5 mg Intravenous Given 11/25/20 1809)              Allergies & Medications:     Pt is allergic to keppra [levetiracetam].    Current/Home Medications    AMPHETAMINE-DEXTROAMPHETAMINE (ADDERALL)  30 MG TABLET    Take 30 mg by mouth 2 (two) times daily.    LAMOTRIGINE (LAMICTAL) 200 MG TABLET    Take 1 tablet (200 mg total) by mouth 2 (two) times daily.           ATTESTATIONS     Cathi Roan, NP    The results of diagnostic studies have been reviewed by myself. The above past medical, family, social, and surgical histories have been reviewed by myself. The clinical impression and plan have been discussed with the patient and/or the patient's family. All questions have been answered.    Note:  This chart was generated by an EMR and may contain errors, including  typographical, or omissions not intended by the user. This chart was generated by the Epic EMR system/speech recognition and may contain inherent errors or omissions not intended by the user. Grammatical errors, random word insertions, deletions, pronoun errors and incomplete sentences are occasional consequences of this technology due to software limitations. Not all errors are caught or corrected. If there are questions or concerns about the content of this note or information contained within the body of this dictation they should be addressed directly with the author for clarification            Cleotis Nipper, NP  11/25/20 1952

## 2020-11-25 NOTE — ED Triage Notes (Signed)
Patient to room E54/E54-A    Christine Andersen is a 35 y.o. female presenting to the ED for Vaginal Bleeding-pregnant    The patient arrived by private car and is alone.    Nursing (triage) note reviewed for the following pertinent information:  onset this am with bright red vaginal bleeding with clots. pt is "10-[redacted] weeks pregnant". G1P0

## 2020-11-25 NOTE — ED Triage Notes (Signed)
Patient to room E52/E52-A    Christine Andersen is a 35 y.o. female presenting to the ED for Vaginal Bleeding    The patient arrived by private car and is accompanied by spouse.    Nursing (triage) note reviewed for the following pertinent information:  in iwth abd pain and vag bleeding post miscariage    Pt seen earlier today and diagnosed with miscarriage

## 2020-11-25 NOTE — ED Provider Notes (Signed)
[facility]  EMERGENCY DEPARTMENT  History and Physical Exam       Patient Name: Christine Andersen, Christine Andersen  Encounter Date:  11/25/2020  Attending Physician: Misty Stanley A. Nunzio Cory, MD  PCP: Phineas Douglas, DO  Patient DOB:  08/09/1985  MRN:  [id number]  Room:  E54/E54-A      History of Presenting Illness     Chief complaint: Vaginal Bleeding-pregnant      HPI/ROS is limited by: none  HPI/ROS given by: Patient and Spouse    Christine Andersen is a G1P0 35 y.o. female LMP 08/05/2020 at about 10-14 weeks by outside u/s who presents with bright red vaginal bleeding as well as passing clots.  She does believe she passed a sac as well, symptoms started this morning.  She has had her initial [facility] visit with Dr. Revonda Standard and is due to have a formal ultrasound next week.    Some lower abdominal pain consistent with menstrual cramps.  No back pain.  No fever.  No nausea or vomiting.  Patient denies breast tenderness.    No lightheadedness or dizziness.  No chest pain.    At the outside clinic, u/s showed IUP with FHTs.  States her beta hCG was 66K       Review of Systems     Review of Systems   Constitutional:  Negative for chills and fever.   HENT:  Negative for congestion, ear pain and sore throat.    Eyes:  Negative for redness and visual disturbance.   Respiratory:  Negative for cough and shortness of breath.    Cardiovascular:  Negative for chest pain and palpitations.   Gastrointestinal:  Negative for abdominal pain, diarrhea, nausea and vomiting.   Genitourinary:  Positive for pelvic pain and vaginal bleeding. Negative for difficulty urinating and dysuria.   Musculoskeletal:  Negative for back pain.   Skin:  Negative for rash.   Neurological:  Negative for weakness, numbness and headaches.   Hematological:  Does not bruise/bleed easily.   Psychiatric/Behavioral: Negative.     All other systems reviewed and are negative.     Allergies & Medications     Pt is allergic to keppra [levetiracetam].    Current/Home  Medications    AMPHETAMINE-DEXTROAMPHETAMINE (ADDERALL) 30 MG TABLET    Take 30 mg by mouth 2 (two) times daily.    LAMOTRIGINE (LAMICTAL) 200 MG TABLET    Take 1 tablet (200 mg total) by mouth 2 (two) times daily.        Past Medical History     Pt  has a past medical history of ADHD (attention deficit hyperactivity disorder), Anxiety, Depression, PCOS (polycystic ovarian syndrome), and Seizures.     Past Surgical History     Pt  has a past surgical history that includes Tonsillectomy; Revision of scar (Bilateral); and Reduction mammaplasty (Bilateral, 2009).     Family History     The family history is not on file.     Social History     Pt reports that she quit smoking about 7 years ago. Her smoking use included cigarettes. She smoked an average of 0.25 packs per day. She has never used smokeless tobacco. She reports current alcohol use. She reports that she does not use drugs.     Physical Exam     Blood pressure 132/45, pulse (!) 110, temperature 98.8 F (37.1 C), temperature source Tympanic, resp. rate 20, height 1.626 m, weight 108.8 kg, last menstrual period 08/05/2020, SpO2 99 %.  Physical Exam  Vitals and nursing note reviewed.   Constitutional:       Appearance: Normal appearance. She is well-developed.   HENT:      Head: Normocephalic and atraumatic.   Cardiovascular:      Rate and Rhythm: Normal rate and regular rhythm.      Heart sounds: No murmur heard.  Pulmonary:      Effort: Pulmonary effort is normal.      Breath sounds: Normal breath sounds. No wheezing.   Abdominal:      General: Bowel sounds are normal.      Palpations: Abdomen is soft.      Tenderness: There is no abdominal tenderness.   Musculoskeletal:      Cervical back: Normal range of motion and neck supple.   Skin:     General: Skin is warm and dry.      Findings: No rash.   Neurological:      General: No focal deficit present.      Mental Status: She is alert and oriented to person, place, and time.   Psychiatric:      Comments:  Anxious, tearful        Diagnostic Results     The results of the diagnostic studies below have been reviewed by myself:    Labs  Labs Reviewed   CBC AND DIFFERENTIAL   HCG QUANTITATIVE   ABO/RH        Medical Decision Making     The differential diagnosis includes, but is not limited to dysfunctional uterine bleeding, cervicitis, endometritis, PID, herpes genitalia, menses, ovarian cyst, ovarian torsion, intrauterine pregnancy, discomfort of pregnancy, threatened abortion, ectopic pregnancy, blighted ovum, IUFD, appendicitis, kidney stone, pyelonephritis, UTI    This chart was generated by an EMR and may contain errors or omissions not intended by the user.  Use of PPE limits accuracy of Dragon voice recognition and spelling errors and random word insertions are unfortunately common.    ED Course & Treatment     Previous records reviewed.  D/dx, workup, anticipated clinical course discussed with patient/family.  Results reviewed with patient/family.  Ultrasound shows empty uterus.  Discussed with patient and her husband at the bedside.  She is hemodynamically stable.  We will not do a formal ultrasound at this visit as she is visibly upset and wants to go home to be with her husband.  I did advise her to return if her bleeding became heavier, she developed a fever or had worsening abdominal pain or had any other concerns.  She is still to follow-up with her OB/GYN.  Patient appreciative of care.  All questions answered.  Patient/family comfortable with treatment plan.  She does appear to have a good support system.        Procedures / Critical Care     BEDSIDE TRANSABDOMINAL U/S    Indication: VB  Views: transverse & longitudinal    Patient identified and verbal consent obtained.    Empty uterus. Findings discussed with patient at time of procedure.           Image attached to chart.  Patient tolerated procedure well with no complications.       Diagnosis / Disposition     Clinical Impression  1. Miscarriage         Disposition  ED Disposition       ED Disposition   Discharge    Condition   --    Date/Time   Fri Nov 25, 2020  1:04 PM    Comment   Susa Simmonds discharge to home/self care.    Condition at disposition: Stable                   Follow up for Discharged Patients  Cresenciano Lick, MD  [address]  [place] [address]  [phone]    Follow up      [facility] Emergency Department  [address]  [place] [address]  [phone]  Follow up  If symptoms worsen    Prescriptions for Discharged Patients  Discharge Medication List as of 11/25/2020  1:04 PM                            Clovis Riley, MD  11/25/20 [phone]

## 2020-11-25 NOTE — Discharge Instructions (Signed)
Thank you for choosing [facility] for your emergency care needs. We strive to provide EXCELLENT care to you and your family.      YOUR ACCURATE CONTACT INFORMATION IS VERY IMPORTANT    Before leaving please check with registration to make sure we have an up-to-date contact number. If you have billing or insurance questions, call [phone].    IF YOU DO NOT CONTINUE TO IMPROVE OR YOUR CONDITION WORSENS, PLEASE CONTACT YOUR DOCTOR OR RETURN IMMEDIATELY TO THE EMERGENCEY DEPARTMENT.    EXTRA AVAILABLE RESOURCES:    DOCTOR REFERRALS  If you need further assistance with referrals to specialty physicians or you have been told you have specific tests pending our Nurse Navigator maybe able to assist you at [phone].  FREE HEALTH SERVICES  [email or web address]  May be utilized if you need help with health or social services, please call 2-1-1 for a free referral to resources in your area. 2-1-1 is a free service connecting people with information on health insurance, free clinics, pregnancy, mental health, dental care, food assistance, housing, and substance abuse counseling.  MEDICAL RECORDS AND TESTS  Certain laboratory test results do not come back the same day, for example: urine cultures may take 3 days. We will attempt to contact you if other important findings are noted. Some lab testing may take 2-5 days. Radiology films are reviewed to ensure accuracy.  If there is any discrepancy, we will notify you.  MY CHART  free, easy, secure way to view portions of your health information.  For assistance or to create an account, call [phone], option 4.  If you have questions about test results, please review your discharge instructions or contact you primary care physician  DISASTER DISTRESS HELP LINE  If you, or someone you care about, are feeling overwhelmed with emotions like sadness, depression, or anxiety, call [phone] or text TalkWithUs to 66746       DISCHARGE  MESSAGE:    YOU ARE THE MOST IMPORTANT FACTOR IN YOUR RECOVERY. Follow the above instructions carefully.  Take your medicines as prescribed. Most important, see your  doctor in follow-up as recommended by your ED physician.  Call your doctor if your condition worsens or if you have any new, worsening, or severe symptoms. If you require immediate assistance, return to the Emergency Department or call 911.    Thanks again for allowing [facility]   Emergency Department to serve you.

## 2021-04-20 LAB — HEPATITIS B SURFACE ANTIGEN W/ REFLEX TO CONFIRMATION: Hepatitis B Surface Antigen: NEGATIVE

## 2021-04-20 LAB — HIV AG/AB 4TH GENERATION: HIV Ag/Ab, 4th Generation: NONREACTIVE

## 2021-04-20 LAB — VH RUBELLA SCREEN, SERUM

## 2021-04-20 LAB — RPR: RPR: NONREACTIVE

## 2021-05-16 ENCOUNTER — Encounter: Payer: Self-pay | Admitting: Maternal & Fetal Medicine

## 2021-06-07 ENCOUNTER — Encounter: Payer: Self-pay | Admitting: Obstetrics & Gynecology

## 2021-06-07 DIAGNOSIS — Z3490 Encounter for supervision of normal pregnancy, unspecified, unspecified trimester: Secondary | ICD-10-CM

## 2021-06-08 ENCOUNTER — Ambulatory Visit
Admission: RE | Admit: 2021-06-08 | Discharge: 2021-06-08 | Disposition: A | Discharge status: Home / Self Care | Payer: Commercial Managed Care - POS | Source: Ambulatory Visit | Attending: Obstetrics & Gynecology | Admitting: Obstetrics & Gynecology

## 2021-06-08 ENCOUNTER — Encounter: Payer: Self-pay | Admitting: Maternal & Fetal Medicine

## 2021-06-08 DIAGNOSIS — O355XX0 Maternal care for (suspected) damage to fetus by drugs, not applicable or unspecified: Secondary | ICD-10-CM | POA: Insufficient documentation

## 2021-06-08 DIAGNOSIS — Z3490 Encounter for supervision of normal pregnancy, unspecified, unspecified trimester: Secondary | ICD-10-CM

## 2021-06-08 DIAGNOSIS — E6609 Other obesity due to excess calories: Secondary | ICD-10-CM | POA: Insufficient documentation

## 2021-06-08 DIAGNOSIS — O352XX0 Maternal care for (suspected) hereditary disease in fetus, not applicable or unspecified: Secondary | ICD-10-CM | POA: Insufficient documentation

## 2021-06-08 DIAGNOSIS — O99340 Other mental disorders complicating pregnancy, unspecified trimester: Secondary | ICD-10-CM | POA: Insufficient documentation

## 2021-06-08 DIAGNOSIS — Z3682 Encounter for antenatal screening for nuchal translucency: Secondary | ICD-10-CM | POA: Insufficient documentation

## 2021-06-08 DIAGNOSIS — O99211 Obesity complicating pregnancy, first trimester: Secondary | ICD-10-CM | POA: Insufficient documentation

## 2021-06-08 DIAGNOSIS — Z3A13 13 weeks gestation of pregnancy: Secondary | ICD-10-CM | POA: Insufficient documentation

## 2021-06-08 DIAGNOSIS — Z141 Cystic fibrosis carrier: Secondary | ICD-10-CM | POA: Insufficient documentation

## 2021-06-09 ENCOUNTER — Other Ambulatory Visit
Admission: RE | Admit: 2021-06-09 | Discharge: 2021-06-09 | Disposition: A | Discharge status: Home / Self Care | Payer: Commercial Managed Care - POS | Source: Ambulatory Visit | Attending: Maternal & Fetal Medicine | Admitting: Maternal & Fetal Medicine

## 2021-06-09 LAB — VH REFERENCE LAB TEST

## 2021-06-22 ENCOUNTER — Other Ambulatory Visit
Admission: RE | Admit: 2021-06-22 | Discharge: 2021-06-22 | Disposition: A | Discharge status: Home / Self Care | Payer: Commercial Managed Care - POS | Source: Ambulatory Visit | Attending: Maternal & Fetal Medicine | Admitting: Maternal & Fetal Medicine

## 2021-06-22 DIAGNOSIS — Z3490 Encounter for supervision of normal pregnancy, unspecified, unspecified trimester: Secondary | ICD-10-CM

## 2021-06-27 LAB — ALPHA FETOPROTEIN, MATERNAL
Alpha-fetoprotein Maternal: 30.1 ng/mL
Alpha-fetoprotein MoM Maternal: 1.24
Alpha-fetoprotein Risk for ONTD: <1:5000 {titer}
Calculated Gestational Age: 15.7
Maternal Weight: 238
Number of Fetuses: 1

## 2021-07-19 ENCOUNTER — Encounter: Payer: Self-pay | Admitting: Obstetrics & Gynecology

## 2021-07-19 DIAGNOSIS — Z3490 Encounter for supervision of normal pregnancy, unspecified, unspecified trimester: Secondary | ICD-10-CM

## 2021-07-20 ENCOUNTER — Other Ambulatory Visit
Admission: RE | Admit: 2021-07-20 | Discharge: 2021-07-20 | Disposition: A | Discharge status: Home / Self Care | Payer: Commercial Managed Care - POS | Source: Ambulatory Visit | Attending: Obstetrics & Gynecology | Admitting: Obstetrics & Gynecology

## 2021-07-20 ENCOUNTER — Other Ambulatory Visit: Payer: Self-pay | Admitting: Maternal & Fetal Medicine

## 2021-07-20 ENCOUNTER — Ambulatory Visit
Admission: RE | Admit: 2021-07-20 | Discharge: 2021-07-20 | Disposition: A | Discharge status: Home / Self Care | Payer: Commercial Managed Care - POS | Source: Ambulatory Visit | Attending: Obstetrics & Gynecology | Admitting: Obstetrics & Gynecology

## 2021-07-20 DIAGNOSIS — O352XX0 Maternal care for (suspected) hereditary disease in fetus, not applicable or unspecified: Secondary | ICD-10-CM | POA: Insufficient documentation

## 2021-07-20 DIAGNOSIS — Z3490 Encounter for supervision of normal pregnancy, unspecified, unspecified trimester: Secondary | ICD-10-CM

## 2021-07-20 DIAGNOSIS — O99352 Diseases of the nervous system complicating pregnancy, second trimester: Secondary | ICD-10-CM | POA: Insufficient documentation

## 2021-07-20 DIAGNOSIS — E6609 Other obesity due to excess calories: Secondary | ICD-10-CM | POA: Insufficient documentation

## 2021-07-20 DIAGNOSIS — O99212 Obesity complicating pregnancy, second trimester: Secondary | ICD-10-CM | POA: Insufficient documentation

## 2021-07-20 DIAGNOSIS — Z3686 Encounter for antenatal screening for cervical length: Secondary | ICD-10-CM | POA: Insufficient documentation

## 2021-07-20 DIAGNOSIS — O4402 Placenta previa specified as without hemorrhage, second trimester: Secondary | ICD-10-CM | POA: Insufficient documentation

## 2021-07-20 DIAGNOSIS — Z141 Cystic fibrosis carrier: Secondary | ICD-10-CM | POA: Insufficient documentation

## 2021-07-20 DIAGNOSIS — O352XX1 Maternal care for (suspected) hereditary disease in fetus, fetus 1: Secondary | ICD-10-CM

## 2021-07-21 LAB — MISCELLANEOUS LAB TEST

## 2021-07-21 LAB — VH LABCORP TESTING

## 2021-07-22 ENCOUNTER — Emergency Department: Payer: Commercial Managed Care - POS

## 2021-07-22 ENCOUNTER — Emergency Department
Admission: EM | Admit: 2021-07-22 | Discharge: 2021-07-22 | Disposition: A | Discharge status: Home / Self Care | Payer: Commercial Managed Care - POS | Source: Ambulatory Visit | Attending: Obstetrics and Gynecology | Admitting: Obstetrics and Gynecology

## 2021-07-22 DIAGNOSIS — Z3A20 20 weeks gestation of pregnancy: Secondary | ICD-10-CM | POA: Insufficient documentation

## 2021-07-22 DIAGNOSIS — Z9889 Other specified postprocedural states: Secondary | ICD-10-CM | POA: Insufficient documentation

## 2021-07-22 DIAGNOSIS — O4412 Placenta previa with hemorrhage, second trimester: Secondary | ICD-10-CM | POA: Insufficient documentation

## 2021-07-22 DIAGNOSIS — F909 Attention-deficit hyperactivity disorder, unspecified type: Secondary | ICD-10-CM | POA: Insufficient documentation

## 2021-07-22 DIAGNOSIS — Z141 Cystic fibrosis carrier: Secondary | ICD-10-CM | POA: Insufficient documentation

## 2021-07-22 DIAGNOSIS — Z888 Allergy status to other drugs, medicaments and biological substances status: Secondary | ICD-10-CM | POA: Insufficient documentation

## 2021-07-22 DIAGNOSIS — F419 Anxiety disorder, unspecified: Secondary | ICD-10-CM | POA: Insufficient documentation

## 2021-07-22 DIAGNOSIS — O99352 Diseases of the nervous system complicating pregnancy, second trimester: Secondary | ICD-10-CM | POA: Insufficient documentation

## 2021-07-22 DIAGNOSIS — O99282 Endocrine, nutritional and metabolic diseases complicating pregnancy, second trimester: Secondary | ICD-10-CM | POA: Insufficient documentation

## 2021-07-22 DIAGNOSIS — O99342 Other mental disorders complicating pregnancy, second trimester: Secondary | ICD-10-CM | POA: Insufficient documentation

## 2021-07-22 DIAGNOSIS — Z87891 Personal history of nicotine dependence: Secondary | ICD-10-CM | POA: Insufficient documentation

## 2021-07-22 DIAGNOSIS — E282 Polycystic ovarian syndrome: Secondary | ICD-10-CM | POA: Insufficient documentation

## 2021-07-22 DIAGNOSIS — F32A Depression, unspecified: Secondary | ICD-10-CM | POA: Insufficient documentation

## 2021-07-22 DIAGNOSIS — G40909 Epilepsy, unspecified, not intractable, without status epilepticus: Secondary | ICD-10-CM | POA: Insufficient documentation

## 2021-07-22 HISTORY — DX: Maternal care for fetal problem, unspecified, unspecified trimester, not applicable or unspecified: O36.90X0

## 2021-07-22 NOTE — OB ED Provider Note (Signed)
OBSTETRICAL EMERGENCY DEPARTMENT   NOTE    Date Time: 07/22/21 7:31 AM  Patient Name: Christine Andersen, Christine Andersen    Principal Problem:   Placenta previa    Subjective:   Patient is a 36 y.o. G2P0010 @ [redacted]w[redacted]d with gush of blood that woke her from her sleep this morning and small amount of bleeding on pad since. No abdominal pain. No fever/chills.   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    OB History       Gravida   2    Para   0    Term   0    Preterm   0    AB   1    Living   0         SAB   1    IAB   0    Ectopic   0    Multiple   0    Live Births   0                Office records were reviewed.     Review of Systems:   10 systems reviewed.  All  are negative except as noted in HPI.    Medications:     Medications Prior to Admission   Medication Sig Dispense Refill Last Dose    amphetamine-dextroamphetamine (ADDERALL) 30 MG tablet Take 30 mg by mouth 2 (two) times daily.       ketorolac (TORADOL) 10 MG tablet Take 1 tablet (10 mg total) by mouth 3 (three) times daily as needed for Pain 15 tablet 0     lamoTRIgine (LAMICTAL) 200 MG tablet Take 1 tablet (200 mg total) by mouth 2 (two) times daily. 60 tablet 0      Allergies   Allergen Reactions    Keppra [Levetiracetam]      Depression, anger, suicidal thoughts     Past Medical History:   Diagnosis Date    ADHD (attention deficit hyperactivity disorder)     Anxiety     Depression     PCOS (polycystic ovarian syndrome)     Seizures      Past Surgical History:   Procedure Laterality Date    REDUCTION MAMMAPLASTY Bilateral 2009    REVISION OF SCAR Bilateral     breast    TONSILLECTOMY       Social History     Socioeconomic History    Marital status: Single   Tobacco Use    Smoking status: Former     Packs/day: 0.25     Types: Cigarettes     Quit date: 10/02/2013     Years since quitting: 7.8    Smokeless tobacco: Never   Vaping Use    Vaping Use:  Never used   Substance and Sexual Activity    Alcohol use: Yes     Comment: socially    Drug use: No     No family history on file.    Physical Exam:     Vitals:    07/22/21 0725   BP: 127/69   Pulse: 100   Resp: 18   Temp: 98.2 F (36.8 C)    There is no height or weight on file to calculate BMI.    General: Alert, No distress, oriented to person, place and time.    Cardiovascular: Normal rate,regular rhythm,no murmurs,  Respiratory:Clear  to auscultation bilaterally,good air movement   Abdomen:soft, nontender,nondistended,no masses or organomegaly  Musculoskeletal:no edema, good ROM,no calf tenderness  Neurologic/ Psychiatric grossly intact,normal mood,behavior,speech,and thought processes.  Integumentary: normal coloration and turgor, no rashes, no suspicious skin lesions noted  Genitourinary:      Pelvic:    SSE: No active bleeding seen, small amount of brown/thin discharge in vault, cervix closed    NST:   NST reviewed: not indicated  FHT 140s    Labs:     Results       ** No results found for the last 24 hours. **            Lab Results   Component Value Date    WBC 10.6 11/25/2020    HGB 13.6 11/25/2020    PLT 398 11/25/2020    NA 137 09/13/2014    K 4.2 09/13/2014    BUN 13 09/13/2014    CREAT 0.75 09/13/2014    MG 2.1 09/13/2014    AST 24 09/13/2014    ALB 3.6 09/13/2014         Assessment:   36 y.o. G2P0010 @ [redacted]w[redacted]d with placenta previa, s/p amniocentesis on 2/16 for CF carrier of both parents, bleeding episode  Plan:   SSE shows no active bleeding. She is Rh+  OB US ordered to evaluate placenta and fluid  Given brown fluid found in vault, will do amnisure test to rule out rupture of membranes  Patient signed out to oncoming hospitalist.     Signed by: Tish Frederickson, DO  7:31 AM

## 2021-07-22 NOTE — Progress Notes (Signed)
Walked into triage room. Pt's S.O. noticed amnisure test result. Sat with pt and S.O. and discussed the meaning of the test, that Dr Revonda Standard had been contacted and she was gathering information and speaking with Dr Chales Abrahams and would be in to see them. Discussed possible ways this would proceed from this point but that MD would give more information and that this RN was speaking from how things have proceeded in the past. Answered as many questions as possible for the couple and reassured them that Dr Revonda Standard would be in to see them. Couple would like to repeat the amnisure test. It was decided to wait a little bit to see if some of the old blood would work its way out for a better test. Pt okay with this. Discussed with pt and s.o. that if they changed their mind would definitely be happy to repeat the test for them. Waiting for MD at this time. Couple appropriate for the information given, tearful.

## 2021-07-22 NOTE — ED Notes (Signed)
Dr Revonda Standard has been in contact with this RN and is now speaking with pt via phone. Pt is okay with that. Dr Chales Abrahams, MFM, consulted and feels that [facility] test is not valid at this gestational age. Pt to follow up with MFM on Monday and go from there.

## 2021-07-22 NOTE — Progress Notes (Signed)
Dr Revonda Standard paged and called back. Discussed pt with MD. U/S finished and awaiting results to be put in computer system. Will update patient with current poc.

## 2021-07-22 NOTE — ED Notes (Signed)
Discharge instructions printed and reviewed in depth with patient and her significant other. They verbalized understanding and denied any questions. Patient left the unit undelivered with all personal belongings.

## 2021-07-22 NOTE — Discharge Instr - AVS First Page (Signed)
Have an easy weekend.   Follow up with Dr Chales Abrahams on Monday  Watch for fluid and discharge over the weekend.  Return to Labor and Delivery with any concerns.

## 2021-07-24 ENCOUNTER — Ambulatory Visit
Admission: RE | Admit: 2021-07-24 | Discharge: 2021-07-24 | Disposition: A | Discharge status: Home / Self Care | Payer: Commercial Managed Care - POS | Source: Ambulatory Visit | Attending: Obstetrics & Gynecology | Admitting: Obstetrics & Gynecology

## 2021-07-24 ENCOUNTER — Encounter: Payer: Self-pay | Admitting: Obstetrics & Gynecology

## 2021-07-24 DIAGNOSIS — O4692 Antepartum hemorrhage, unspecified, second trimester: Secondary | ICD-10-CM | POA: Insufficient documentation

## 2021-07-24 DIAGNOSIS — E6609 Other obesity due to excess calories: Secondary | ICD-10-CM | POA: Insufficient documentation

## 2021-07-24 DIAGNOSIS — O99212 Obesity complicating pregnancy, second trimester: Secondary | ICD-10-CM | POA: Insufficient documentation

## 2021-07-24 DIAGNOSIS — Z3490 Encounter for supervision of normal pregnancy, unspecified, unspecified trimester: Secondary | ICD-10-CM

## 2021-07-24 DIAGNOSIS — O352XX0 Maternal care for (suspected) hereditary disease in fetus, not applicable or unspecified: Secondary | ICD-10-CM | POA: Insufficient documentation

## 2021-07-24 DIAGNOSIS — Z141 Cystic fibrosis carrier: Secondary | ICD-10-CM | POA: Insufficient documentation

## 2021-07-24 DIAGNOSIS — Z3A20 20 weeks gestation of pregnancy: Secondary | ICD-10-CM | POA: Insufficient documentation

## 2021-07-24 LAB — VH AMNISURE: Rupture of Membrane AmniSure: POSITIVE — AB

## 2021-08-07 LAB — PRENATAL MUTATION SPECIFIC SEQUENCING

## 2021-09-02 ENCOUNTER — Inpatient Hospital Stay: Payer: Commercial Managed Care - POS

## 2021-09-02 ENCOUNTER — Inpatient Hospital Stay
Admission: AD | Admit: 2021-09-02 | Discharge: 2021-09-05 | DRG: 833 | Disposition: A | Discharge status: Home / Self Care | Payer: Commercial Managed Care - POS | Source: Ambulatory Visit | Attending: Obstetrics & Gynecology | Admitting: Obstetrics & Gynecology

## 2021-09-02 ENCOUNTER — Encounter: Payer: Self-pay | Admitting: Obstetrics and Gynecology

## 2021-09-02 DIAGNOSIS — O99282 Endocrine, nutritional and metabolic diseases complicating pregnancy, second trimester: Secondary | ICD-10-CM | POA: Diagnosis present

## 2021-09-02 DIAGNOSIS — G40909 Epilepsy, unspecified, not intractable, without status epilepticus: Secondary | ICD-10-CM | POA: Diagnosis present

## 2021-09-02 DIAGNOSIS — Z3490 Encounter for supervision of normal pregnancy, unspecified, unspecified trimester: Secondary | ICD-10-CM

## 2021-09-02 DIAGNOSIS — Z28310 Unvaccinated for covid-19: Secondary | ICD-10-CM

## 2021-09-02 DIAGNOSIS — O4412 Placenta previa with hemorrhage, second trimester: Principal | ICD-10-CM | POA: Diagnosis present

## 2021-09-02 DIAGNOSIS — O4402 Placenta previa specified as without hemorrhage, second trimester: Secondary | ICD-10-CM | POA: Diagnosis present

## 2021-09-02 DIAGNOSIS — Z3A26 26 weeks gestation of pregnancy: Secondary | ICD-10-CM

## 2021-09-02 DIAGNOSIS — E282 Polycystic ovarian syndrome: Secondary | ICD-10-CM | POA: Diagnosis present

## 2021-09-02 DIAGNOSIS — Z87891 Personal history of nicotine dependence: Secondary | ICD-10-CM

## 2021-09-02 DIAGNOSIS — O99352 Diseases of the nervous system complicating pregnancy, second trimester: Secondary | ICD-10-CM | POA: Diagnosis present

## 2021-09-02 HISTORY — DX: Complete placenta previa nos or without hemorrhage, unspecified trimester: O44.00

## 2021-09-02 LAB — VH URINE DRUG SCREEN - NO CONFIRMATION
Propoxyphene: NEGATIVE
Urine Amphetamine: NEGATIVE
Urine Barbiturates: NEGATIVE
Urine Benzodiazepines: NEGATIVE
Urine Buprenorphine: NEGATIVE
Urine Cannabinoids: NEGATIVE
Urine Cocaine: NEGATIVE
Urine Creatinine Random: 35 mg/dL
Urine Ecstasy Screen: NEGATIVE
Urine Fentanyl Screen: NEGATIVE
Urine Methadone Screen: NEGATIVE
Urine Opiates: NEGATIVE
Urine Oxycodone: NEGATIVE
Urine Phencyclidine: NEGATIVE
Urine Specific Gravity: 1.009 (ref 1.001–1.040)
pH, Urine: 6.1 pH (ref 5.0–8.0)

## 2021-09-02 MED ORDER — BETAMETHASONE SOD PHOS & ACET 6 (3-3) MG/ML IJ SUSP
12.0000 mg | INTRAMUSCULAR | Status: DC
Start: 2021-09-02 — End: 2021-09-02

## 2021-09-02 MED ORDER — ONDANSETRON 4 MG PO TBDP
4.0000 mg | ORAL_TABLET | Freq: Three times a day (TID) | ORAL | Status: DC | PRN
Start: 2021-09-02 — End: 2021-09-05

## 2021-09-02 MED ORDER — VH MAGNESIUM SULFATE BOLUS FROM BAG (FOR L&D USE)
4.0000 g | Freq: Once | INTRAVENOUS | Status: AC
Start: 2021-09-03 — End: 2021-09-03
  Administered 2021-09-03: 4 g via INTRAVENOUS
  Filled 2021-09-02: qty 500

## 2021-09-02 MED ORDER — CALCIUM GLUCONATE 10 % IV SOLN
1.0000 g | INTRAVENOUS | Status: DC | PRN
Start: 2021-09-02 — End: 2021-09-05

## 2021-09-02 MED ORDER — SODIUM CHLORIDE (PF) 0.9 % IJ SOLN
20.0000 mg | Freq: Two times a day (BID) | INTRAVENOUS | Status: DC | PRN
Start: 2021-09-02 — End: 2021-09-05

## 2021-09-02 MED ORDER — ACETAMINOPHEN 325 MG PO TABS
650.0000 mg | ORAL_TABLET | Freq: Four times a day (QID) | ORAL | Status: DC | PRN
Start: 2021-09-02 — End: 2021-09-05

## 2021-09-02 MED ORDER — VH MAGNESIUM SULFATE INFUSION
2.0000 g/h | INTRAVENOUS | Status: AC
Start: 2021-09-03 — End: 2021-09-03
  Administered 2021-09-03 (×2): 2 g/h via INTRAVENOUS
  Filled 2021-09-02: qty 500

## 2021-09-02 MED ORDER — ONDANSETRON HCL 4 MG/2ML IJ SOLN
4.0000 mg | Freq: Three times a day (TID) | INTRAMUSCULAR | Status: DC | PRN
Start: 2021-09-02 — End: 2021-09-05

## 2021-09-02 MED ORDER — PATIENT SUPPLIED NON FORMULARY
30.0000 mg | Freq: Two times a day (BID) | Status: DC
Start: 2021-09-02 — End: 2021-09-02

## 2021-09-02 MED ORDER — SODIUM CHLORIDE (PF) 0.9 % IJ SOLN
3.0000 mL | Freq: Two times a day (BID) | INTRAMUSCULAR | Status: DC
Start: 2021-09-02 — End: 2021-09-05
  Administered 2021-09-04 (×2): 3 mL via INTRAVENOUS

## 2021-09-02 MED ORDER — PRENATAL 27-0.8 MG PO TABS
1.0000 | ORAL_TABLET | Freq: Every day | ORAL | Status: DC
Start: 2021-09-03 — End: 2021-09-05
  Administered 2021-09-03 – 2021-09-04 (×2): 1 via ORAL
  Filled 2021-09-02 (×3): qty 1

## 2021-09-02 MED ORDER — NIFEDIPINE 10 MG PO CAPS
10.0000 mg | ORAL_CAPSULE | ORAL | Status: DC | PRN
Start: 2021-09-02 — End: 2021-09-05

## 2021-09-02 MED ORDER — BETAMETHASONE SOD PHOS & ACET 6 (3-3) MG/ML IJ SUSP
12.0000 mg | INTRAMUSCULAR | Status: AC
Start: 2021-09-02 — End: 2021-09-03
  Administered 2021-09-02 – 2021-09-03 (×2): 12 mg via INTRAMUSCULAR
  Filled 2021-09-02: qty 5

## 2021-09-02 MED ORDER — HYDRALAZINE HCL 20 MG/ML IJ SOLN
10.0000 mg | INTRAMUSCULAR | Status: DC | PRN
Start: 2021-09-02 — End: 2021-09-05

## 2021-09-02 MED ORDER — LAMOTRIGINE 200 MG PO TABS
200.0000 mg | ORAL_TABLET | Freq: Two times a day (BID) | ORAL | Status: DC
Start: 2021-09-02 — End: 2021-09-02
  Filled 2021-09-02: qty 1

## 2021-09-02 NOTE — H&P (Addendum)
OBSTETRICS - ANTEPARTUM ADMISSION H&P    09/02/2021 10:59 PM    Christine Andersen is a 36 y.o.G2P0010 with an Estimated Date of Delivery: 12/08/21 at [redacted]w[redacted]d gestation who is being admitted for vaginal bleeding with diagnosis of placenta previa . Reports mopping the floors today and after dinner, she noted vaginal bleeding with passage of golf ball size clot X 1 around 21:00. Says the bleeding since then has slowed. Patient denies loss of fluid, decreased fetal movement, and uterine contractions. The patient reports that she has had benign prenatal care. Available prenatal care records reviewed by myself.    Past Medical History:   Diagnosis Date    ADHD (attention deficit hyperactivity disorder)     PCOS (polycystic ovarian syndrome)     Placenta previa     Seizures          Past Surgical History:  Past Surgical History:   Procedure Laterality Date    REDUCTION MAMMAPLASTY Bilateral 2009    REVISION OF SCAR Bilateral     breast    TONSILLECTOMY         Past Gynecologic History:   Medications Prior to Admission   Medication Sig Dispense Refill Last Dose    ferrous sulfate 325 (65 FE) MG tablet Take 325 mg by mouth 2 (two) times daily       Magnesium 250 MG Tab Take 1 tablet by mouth Once a day at 6:00am       Prenatal Vit-Fe Fumarate-FA (PRENATAL VITAMINS PO) Take 1 tablet by mouth       amphetamine-dextroamphetamine (ADDERALL) 30 MG tablet Take 30 mg by mouth 2 (two) times daily.       ketorolac (TORADOL) 10 MG tablet Take 1 tablet (10 mg total) by mouth 3 (three) times daily as needed for Pain 15 tablet 0     lamoTRIgine (LAMICTAL) 200 MG tablet Take 1 tablet (200 mg total) by mouth 2 (two) times daily. 60 tablet 0        Obstetrical History:   Patient's last menstrual period was 08/05/2020.  OB History   Gravida Para Term Preterm AB Living   2 0 0 0 1 0   SAB IAB Ectopic Multiple Live Births   1 0 0 0 0      # Outcome Date GA Lbr Len/2nd Weight Sex Delivery Anes PTL Lv   2 Current            1 SAB                 Social History:  reports that she quit smoking about 7 years ago. Her smoking use included cigarettes. She smoked an average of .25 packs per day. She has never used smokeless tobacco. She reports current alcohol use. She reports that she does not use drugs.    Family History:  History reviewed. No pertinent family history.      Medications:      Medications Prior to Admission   Medication Sig    ferrous sulfate 325 (65 FE) MG tablet Take 325 mg by mouth 2 (two) times daily    Magnesium 250 MG Tab Take 1 tablet by mouth Once a day at 6:00am    Prenatal Vit-Fe Fumarate-FA (PRENATAL VITAMINS PO) Take 1 tablet by mouth    amphetamine-dextroamphetamine (ADDERALL) 30 MG tablet Take 30 mg by mouth 2 (two) times daily.    ketorolac (TORADOL) 10 MG tablet Take 1 tablet (10 mg total) by mouth 3 (three)  times daily as needed for Pain    lamoTRIgine (LAMICTAL) 200 MG tablet Take 1 tablet (200 mg total) by mouth 2 (two) times daily.       Allergies:   Allergies   Allergen Reactions    Keppra [Levetiracetam]      Depression, anger, suicidal thoughts       Review of Systems:  A comprehensive review of systems was:  Negative    Physical Exam:  Patient Vitals for the past 24 hrs:   BP Temp Temp src Pulse Resp Height Weight   09/02/21 2205 109/59 98.4 F (36.9 C) Oral (!) 106 20 1.651 m (5\' 5" ) 115.6 kg (254 lb 12.8 oz)     FHTs: 150, LT and ST variability, no decels, reactive, category 1  UCs: none on external tocometer, none perceived by patient  Cx: deferred    General: No apparent distress; appears comfortable.  Heart: Regular rate and rhythm without murmur, gallop, or rub.   Lungs: Clear to auscultation bilaterally without wheezes, rhonchi, or rales.   Abdomen: Gravid, NT with appropriate FH.   Back: No CVAT, atraumatic, no point tenderness.  Extremities: No cyanosis or erythema. Mild bilateral LE edema. Symmetric. No calf pain   Vulva/Vagina: No lesions, nl. female anatomy, nl. urethral meatus.    Prenatal Care  Labs:  Lab Results   Component Value Date    ABORH O Positive 11/25/2020    HEPBSAG Negative 04/20/2021    RPR Nonreactive 04/20/2021    RUBELLAABIGG immunue 04/20/2021     Recent Labs:     Results       Procedure Component Value Units Date/Time    Hepatitis B (HBV) Surface Antigen w/ Reflex to Confirmation [[id number]] Resulted: 04/20/21    Specimen: Blood Updated: 09/02/21 2246     Hepatitis B Surface Antigen Negative    RPR [[id number]] Resulted: 04/20/21    Specimen: Blood Updated: 09/02/21 2246     RPR Nonreactive    HIV Ag/Ab 4th generation [[id number]] Resulted: 04/20/21     Updated: 09/02/21 2246     HIV Ag/Ab, 4th Generation nonreactive    Rubella Screen, Serum [[id number]] Resulted: 04/20/21     Updated: 09/02/21 2246     Rubella IgG immunue    Urine Drug Screen - No Confirmation [[id number]] Collected: 09/02/21 2000    Specimen: Urine, Random Updated: 09/02/21 2227    Narrative:      This is a screening test only.  Any presumptive positive result should be confirmed by more definitive methodologies. Any unconfirmed results should be used for medical purposes only.  Samples with creatinine concentrations <20 mg/dl are considered suspect and resubmission is recommended.  The following thresholds are the minimal levels for detection:  Amphetamine    1000 ng/mL  Methadone     300 ng/mL  Barbiturate     200 ng/mL  Opiate        300 ng/mL  Benzodiazepine  200 ng/mL  Oxycodone     100 ng/mL  Cannabinoid      50 ng/mL  Phenyclidine   25 ng/mL  Cocaine         300 ng/mL  Propoxyphene  300 ng/mL  Buprenorphine     5 ng/mL  Ecstasy       500 ng/mL  Fentanyl          2 ng/mL            A/P:  1. Pre-term IUP in 36 y.o.G2P0010 at [redacted]w[redacted]d gestation.  2. Admitted for vaginal bleeding in 2nd trimester,known placenta previa  3. Reassuring fetal surveillance - CEFM/Toco.  4. Betamethasone series for FLM, MgSo4 for neuro protection  5. Will get U/S for EFW, CL and placental location  6. CBC/Coags/fibrinogen/T+S sent, CMP/GBS/ESBL  Cx to be sent  7. SCDs for DVT prophylaxis  8. NICU consult  9. Will follow closely    Cresenciano Lick, MD

## 2021-09-03 DIAGNOSIS — O4402 Placenta previa specified as without hemorrhage, second trimester: Secondary | ICD-10-CM

## 2021-09-03 DIAGNOSIS — Z3A26 26 weeks gestation of pregnancy: Secondary | ICD-10-CM

## 2021-09-03 LAB — PT AND APTT
PT INR: 1 (ref 0.9–1.1)
PT: 10 s (ref 9.4–11.5)
aPTT: 24.5 s (ref 24.0–34.0)

## 2021-09-03 LAB — CBC AND DIFFERENTIAL
Basophils %: 0.5 % (ref 0.0–3.0)
Basophils Absolute: 0.1 10*3/uL (ref 0.0–0.3)
Eosinophils %: 1.3 % (ref 0.0–7.0)
Eosinophils Absolute: 0.1 10*3/uL (ref 0.0–0.8)
Hematocrit: 30.9 % — ABNORMAL LOW (ref 36.0–48.0)
Hemoglobin: 10.6 gm/dL — ABNORMAL LOW (ref 12.0–16.0)
Lymphocytes Absolute: 2.3 10*3/uL (ref 0.6–5.1)
Lymphocytes: 22.1 % (ref 15.0–46.0)
MCH: 30 pg (ref 28–35)
MCHC: 34 gm/dL (ref 32–36)
MCV: 86 fL (ref 80–100)
MPV: 7.1 fL (ref 6.0–10.0)
Monocytes Absolute: 0.7 10*3/uL (ref 0.1–1.7)
Monocytes: 6.4 % (ref 3.0–15.0)
Neutrophils %: 69.6 % (ref 42.0–78.0)
Neutrophils Absolute: 7.3 10*3/uL (ref 1.7–8.6)
PLT CT: 299 10*3/uL (ref 130–440)
RBC: 3.58 10*6/uL — ABNORMAL LOW (ref 3.80–5.00)
RDW: 13.7 % (ref 11.0–14.0)
WBC: 10.5 10*3/uL (ref 4.0–11.0)

## 2021-09-03 LAB — COMPREHENSIVE METABOLIC PANEL
ALT: 10 U/L (ref 0–55)
AST (SGOT): 11 U/L (ref 10–42)
Albumin/Globulin Ratio: 0.84 Ratio (ref 0.80–2.00)
Albumin: 2.7 gm/dL — ABNORMAL LOW (ref 3.5–5.0)
Alkaline Phosphatase: 60 U/L (ref 40–145)
Anion Gap: 11.6 mMol/L (ref 7.0–18.0)
BUN / Creatinine Ratio: 11.3 Ratio (ref 10.0–30.0)
BUN: 7 mg/dL (ref 7–22)
Bilirubin, Total: 0.2 mg/dL (ref 0.1–1.2)
CO2: 24 mMol/L (ref 20–30)
Calcium: 9.5 mg/dL (ref 8.5–10.5)
Chloride: 105 mMol/L (ref 98–110)
Creatinine: 0.62 mg/dL (ref 0.60–1.20)
EGFR: 121 mL/min/{1.73_m2} (ref 60–150)
Globulin: 3.2 gm/dL (ref 2.0–4.0)
Glucose: 98 mg/dL (ref 71–99)
Osmolality Calculated: 272 mOsm/kg — ABNORMAL LOW (ref 275–300)
Potassium: 3.6 mMol/L (ref 3.5–5.3)
Protein, Total: 5.9 gm/dL — ABNORMAL LOW (ref 6.0–8.3)
Sodium: 137 mMol/L (ref 136–147)

## 2021-09-03 LAB — VH CULTURE, ESBL

## 2021-09-03 LAB — TYPE AND SCREEN
AB Screen: NEGATIVE
ABO Rh: O POS

## 2021-09-03 LAB — VH FETAL CELL STAIN: Kleihauer Betke: NEGATIVE

## 2021-09-03 LAB — FIBRINOGEN: Fibrinogen: 462 mg/dL (ref 181–469)

## 2021-09-03 LAB — VH GROUP B STREP BY PCR: Group B Strep: NEGATIVE

## 2021-09-03 MED ORDER — LACTATED RINGERS IV SOLN
INTRAVENOUS | Status: DC
Start: 2021-09-03 — End: 2021-09-05

## 2021-09-03 MED ORDER — SODIUM CHLORIDE (PF) 0.9 % IJ SOLN
3.0000 mL | Freq: Two times a day (BID) | INTRAMUSCULAR | Status: DC
Start: 2021-09-03 — End: 2021-09-05
  Administered 2021-09-03 – 2021-09-04 (×3): 3 mL via INTRAVENOUS

## 2021-09-03 NOTE — Progress Notes (Signed)
OBSTETRICS - ANTEPARTUM PROGRESS NOTE    09/03/2021 11:42 AM    Christine Andersen is a 36 y.o.G2P0010 with Estimated Date of Delivery: 12/08/21 and at [redacted]w[redacted]d gestation who was admitted to the antepartum service for 2nd episode of vaginal bleeding with known placenta previa. Patient without complaints this am. She reports no active bleeding at this time. She saw red/brown blood at 8:00am when she wiped otherwise is noting only brown d/c. Denies UC/LOF. Reports excellent FM.    Physical Exam:  Patient Vitals for the past 24 hrs:   BP Temp Temp src Pulse Resp SpO2 Height Weight   09/03/21 1100 105/56 -- -- (!) 108 -- -- -- --   09/03/21 1000 119/59 -- -- (!) 114 -- -- -- --   09/03/21 0900 116/57 -- -- (!) 111 -- -- -- --   09/03/21 0800 109/56 -- -- (!) 107 -- 96 % -- --   09/03/21 0700 104/55 97.9 F (36.6 C) Oral (!) 107 15 97 % -- --   09/03/21 0658 104/55 -- -- (!) 107 16 95 % -- --   09/03/21 0600 113/57 -- -- (!) 108 15 96 % -- --   09/03/21 0500 109/51 -- -- (!) 106 16 96 % -- --   09/03/21 0400 115/55 -- -- (!) 107 19 96 % -- --   09/03/21 0345 -- -- -- -- -- 98 % -- --   09/03/21 0330 -- -- -- -- -- 94 % -- --   09/03/21 0300 115/61 -- -- (!) 104 17 95 % -- --   09/03/21 0245 -- -- -- -- -- 95 % -- --   09/03/21 0230 -- -- -- -- -- 96 % -- --   09/03/21 0200 124/56 -- -- (!) 105 18 96 % -- --   09/03/21 0130 115/59 -- -- (!) 103 17 95 % -- --   09/03/21 0100 115/59 -- -- (!) 108 18 96 % -- --   09/03/21 0050 114/55 -- -- (!) 104 16 95 % -- --   09/03/21 0035 109/55 -- -- (!) 105 16 96 % -- --   09/03/21 0020 121/56 98.7 F (37.1 C) Oral (!) 103 17 96 % -- --   09/03/21 0006 121/59 -- -- (!) 103 19 96 % -- --   09/02/21 2205 109/59 98.4 F (36.9 C) Oral (!) 106 20 -- 1.651 m (5\' 5" ) 115.6 kg (254 lb 12.8 oz)     FHTs: 140, LT and ST variability, occassional mild variable deceleration, category 1, AGA  UCs: none    General: No apparent distress; appears comfortable.  Abdomen: Gravid, NT with appropriate  FH.  Back: No CVAT, atraumatic, no point tenderness.  Extremities: No cyanosis or erythema. Mild bilateral LE edema. Symmetric. No calf pain.     Prenatal Care Labs:  Lab Results   Component Value Date    ABORH O Positive 09/02/2021    HEPBSAG Negative 04/20/2021    RPR Nonreactive 04/20/2021    RUBELLAABIGG immunue 04/20/2021     Results       Procedure Component Value Units Date/Time    Stool Culture ESBL [[id number]] Collected: 09/03/21 0024    Specimen: Rectal Swab Updated: 09/03/21 1024     Culture Result Culture In Progress    Fetal Cell Stain [[id number]] Collected: 09/02/21 2345    Specimen: Blood Updated: 09/03/21 0321     Kleihauer Betke Negative    Vaginal Rectal Group B Strep by PCR [  [id number]] Collected: 09/03/21 0024    Specimen: Vaginal/Rectal Updated: 09/03/21 0233     Group B Strep Negative for Group B Strep    Type and Screen [[id number]] Collected: 09/02/21 2345    Specimen: Blood Updated: 09/03/21 0054     ABO Rh O Positive     AB Screen NEGATIVE    Comprehensive metabolic panel [[id number]]  (Abnormal) Collected: 09/02/21 2345    Specimen: Plasma Updated: 09/03/21 0036     Sodium 137 mMol/L      Potassium 3.6 mMol/L      Chloride 105 mMol/L      CO2 24 mMol/L      Calcium 9.5 mg/dL      Glucose 98 mg/dL      Creatinine 0.86 mg/dL      BUN 7 mg/dL      Protein, Total 5.9 gm/dL      Albumin 2.7 gm/dL      Alkaline Phosphatase 60 U/L      ALT 10 U/L      AST (SGOT) 11 U/L      Bilirubin, Total 0.2 mg/dL      Albumin/Globulin Ratio 0.84 Ratio      Anion Gap 11.6 mMol/L      BUN / Creatinine Ratio 11.3 Ratio      EGFR 121 mL/min/1.58m2      Osmolality Calculated 272 mOsm/kg      Globulin 3.2 gm/dL     Fibrinogen [[id number]] Collected: 09/02/21 2345    Specimen: Blood Updated: 09/03/21 0032     Fibrinogen 462 mg/dL     PT/APTT [[id number]] Collected: 09/02/21 2345    Specimen: Blood Updated: 09/03/21 0032     PT 10.0 sec      PT INR 1.0     aPTT 24.5 sec     CBC and differential [[id number]]  (Abnormal)  Collected: 09/02/21 2345    Specimen: Blood Updated: 09/03/21 0018     WBC 10.5 K/cmm      RBC 3.58 M/cmm      Hemoglobin 10.6 gm/dL      Hematocrit 64.4 %      MCV 86 fL      MCH 30 pg      MCHC 34 gm/dL      RDW 03.4 %      PLT CT 299 K/cmm      MPV 7.1 fL      Neutrophils % 69.6 %      Lymphocytes 22.1 %      Monocytes 6.4 %      Eosinophils % 1.3 %      Basophils % 0.5 %      Neutrophils Absolute 7.3 K/cmm      Lymphocytes Absolute 2.3 K/cmm      Monocytes Absolute 0.7 K/cmm      Eosinophils Absolute 0.1 K/cmm      Basophils Absolute 0.1 K/cmm     Urine Drug Screen - No Confirmation [[id number]] Collected: 09/02/21 2000    Specimen: Urine, Random Updated: 09/02/21 2311     Urine Creatinine Random 35 mg/dL      pH, Urine 6.1 pH      Urine Specific Gravity 1.009     Urine Amphetamine Negative     Urine Barbiturates Negative     Urine Benzodiazepines Negative     Urine Cannabinoids Negative     Urine Cocaine Negative     Urine Methadone Screen Negative     Urine Opiates Negative  Urine Oxycodone Negative     Urine Phencyclidine Negative     Propoxyphene Negative     Urine Buprenorphine Negative     Urine Ecstasy Screen Negative     Urine Fentanyl Screen Negative    Narrative:      This is a screening test only.  Any presumptive positive result should be confirmed by more definitive methodologies. Any unconfirmed results should be used for medical purposes only.  Samples with creatinine concentrations <20 mg/dl are considered suspect and resubmission is recommended.  The following thresholds are the minimal levels for detection:  Amphetamine    1000 ng/mL  Methadone     300 ng/mL  Barbiturate     200 ng/mL  Opiate        300 ng/mL  Benzodiazepine  200 ng/mL  Oxycodone     100 ng/mL  Cannabinoid      50 ng/mL  Phenyclidine   25 ng/mL  Cocaine         300 ng/mL  Propoxyphene  300 ng/mL  Buprenorphine     5 ng/mL  Ecstasy       500 ng/mL  Fentanyl          2 ng/mL    Hepatitis B (HBV) Surface Antigen w/ Reflex to  Confirmation [[id number]] Resulted: 04/20/21    Specimen: Blood Updated: 09/02/21 2246     Hepatitis B Surface Antigen Negative    RPR [[id number]] Resulted: 04/20/21    Specimen: Blood Updated: 09/02/21 2246     RPR Nonreactive    HIV Ag/Ab 4th generation [[id number]] Resulted: 04/20/21     Updated: 09/02/21 2246     HIV Ag/Ab, 4th Generation nonreactive    Rubella Screen, Serum [[id number]] Resulted: 04/20/21     Updated: 09/02/21 2246     Rubella IgG immunue             Assessment and Plan:  1. IUP in 36 y.o.G2P0010 at [redacted]w[redacted]d gestation.  2. Admitted for vaginal bleeding in 2nd trimester,known placenta previa  3. No evidence of acute maternal or fetal compromise.  4. Reassuring fetal surveillance  5. Labs and U/S reviewed with patient and husband  6. S/p BMZ # 1 at 2245, 2nd dose tonight. Finishing 12 hr MgSO4 for fetal neuro-protection at noon  7. MFM consult tomorrow am  8. Will follow    Cresenciano Lick, MD

## 2021-09-03 NOTE — Consults (Signed)
[facility]  Neonatologist Antenatal Consult Note    Patient: Christine Andersen  Date of Birth: 1985-10-02   DOA: 09/02/2021  MRN: [id number]   Age: 36 y.o.  Race: Not of Hispanic/Latino/Spanish origin [1]  Attending MD:      Cresenciano Lick, MD   Consultant:        Denice Paradise, NP  Date of Consult:  09/03/2021, 1:24 PM    Significant Maternal History:     Estimated Date of Delivery: 12/08/21     OB History       Gravida   2    Para   0    Term   0    Preterm   0    AB   1    Living   0         SAB   1    IAB   0    Ectopic   0    Multiple   0    Live Births   0               Chief Complaint   Patient presents with    Vaginal Bleeding-pregnant     Known Previa     Principal Problem:    Pregnancy  Active Problems:    Placenta previa in second trimester    Past Medical History:   Diagnosis Date    ADHD (attention deficit hyperactivity disorder)     PCOS (polycystic ovarian syndrome)     Placenta previa     Seizures      Past Surgical History:   Procedure Laterality Date    REDUCTION MAMMAPLASTY Bilateral 2009    REVISION OF SCAR Bilateral     breast    TONSILLECTOMY         Labs:     Blood Type:    ABO Rh   Date Value Ref Range Status   09/02/2021 O Positive  Final        Serologies:  Rubella IgG   Date Value Ref Range Status   04/20/2021 immunue  Final     RPR   Date Value Ref Range Status   04/20/2021 Nonreactive Borderline, Nonreactive Final     Hepatitis B Surface Antigen   Date Value Ref Range Status   04/20/2021 Negative  Final         Maternal/Fetal Condition:     Prenatal complications: placenta previa       FHT: Baseline Rate: 145 BPM    Ultrasounds:     FROM 09/02/21: Findings:  There is a single live intrauterine gestation in cephalic presentation. There is an anterior placenta with evidence of complete previa. No abnormal fluid collections are noted.     There is a normal amount of amniotic fluid. Cervix measures 3.9 cm in length and is closed.     Composite ultrasound age is 24 weeks 5 days.  Fetal heart activity measures 147 beats per minutes.     IMPRESSION:      1. Evidence of complete placenta previa. Anterior placenta is present. No abnormal fluid collections.  2. Cervix measures 3.9 cm and is closed.  3. Single live intrauterine pregnancy with an ultrasound age of 24 weeks 5 days.    Amniocentesis:  [x]   YES    []   NO    Rupture of membranes:  Rupture Date: This patient has no babies on file.  Rupture Time: This patient has no babies on file.  Rupture Type: This patient has no babies on file.   Fluid Color: This patient has no babies on file.  Fluid Odor: This patient has no babies on file.     Medications:     Home Medications:   Medications Prior to Admission   Medication Sig    ferrous sulfate 325 (65 FE) MG tablet Take 325 mg by mouth 2 (two) times daily    Magnesium 250 MG Tab Take 1 tablet by mouth Once a day at 6:00am    Prenatal Vit-Fe Fumarate-FA (PRENATAL VITAMINS PO) Take 1 tablet by mouth    amphetamine-dextroamphetamine (ADDERALL) 30 MG tablet Take 30 mg by mouth 2 (two) times daily.    ketorolac (TORADOL) 10 MG tablet Take 1 tablet (10 mg total) by mouth 3 (three) times daily as needed for Pain    lamoTRIgine (LAMICTAL) 200 MG tablet Take 1 tablet (200 mg total) by mouth 2 (two) times daily.       Labor Medications:               [x]  Antenatal Steroids        YES,  # of Doses:1, Date: 09/02/2021 AT 2200    []  Diabetic Meds        []  Insulin        []  Glyburide    []  Antibiotics        []  Ampicililn        []  Gentamycin        []  Other    []  Antihypertensives      [x]  Magnesium Sulfate  []  Pitocin    []  Unasyn  []  Zithromax     Discussion:     Survival rate:  approximately 86-93% for a 26 week neonate with unknown weight, assuming average.   Estimated LOS: Approximately mother's due date.  Delivery Room Management/ NICU admission  NICU course:   Respiratory Distress Syndrome (RDS), Bronchopulmonary Dysplasia (BPD), Apnea, Patent Ductus Arteriosus (PDA), Necrotizing Enterocolitis (NEC),  Intraventricular Hemorrhage (IVH), Feeding Intolerance, Prolonged IV Nutrition, Retinopathy of Prematurity (ROP)  IV access:   Umbilical Artery Catheter (UAC), Umbilical Venous Catheter (UVC), Peripherally Inserted Venous Catheter (PICC), Peripheral Intravenous Line (PIV)   Intubation, other respiratory support   Blood Transfusion(s) from blood bank source  Breastfeeding promoted and donor and Prolacta breast milk discussed  Long-term neurodevelopmental complications:  Risk increases with decreasing gestational age at delivery    Questions addressed:  Parents given opportunity to ask questions and subsequently answered.      Family present: mother and father.        Time spent with consultation:       [x]   0 - 20 mins      []   21 - 40 mins      []   41 - 55 mins      Denice Paradise, NP  09/03/2021 1:24 PM

## 2021-09-04 ENCOUNTER — Encounter: Payer: Self-pay | Admitting: Obstetrics & Gynecology

## 2021-09-04 ENCOUNTER — Ambulatory Visit: Payer: Commercial Managed Care - POS

## 2021-09-04 DIAGNOSIS — Z3490 Encounter for supervision of normal pregnancy, unspecified, unspecified trimester: Secondary | ICD-10-CM

## 2021-09-04 LAB — VH CULTURE, GROUP B STREPTOCOCCUS

## 2021-09-04 LAB — CBC AND DIFFERENTIAL
Basophils %: 0.2 % (ref 0.0–3.0)
Basophils Absolute: 0 10*3/uL (ref 0.0–0.3)
Eosinophils %: 0.5 % (ref 0.0–7.0)
Eosinophils Absolute: 0.1 10*3/uL (ref 0.0–0.8)
Hematocrit: 29.8 % — ABNORMAL LOW (ref 36.0–48.0)
Hemoglobin: 10 gm/dL — ABNORMAL LOW (ref 12.0–16.0)
Lymphocytes Absolute: 1.2 10*3/uL (ref 0.6–5.1)
Lymphocytes: 9.3 % — ABNORMAL LOW (ref 15.0–46.0)
MCH: 29 pg (ref 28–35)
MCHC: 34 gm/dL (ref 32–36)
MCV: 87 fL (ref 80–100)
MPV: 7.4 fL (ref 6.0–10.0)
Monocytes Absolute: 0.4 10*3/uL (ref 0.1–1.7)
Monocytes: 3.1 % (ref 3.0–15.0)
Neutrophils %: 86.9 % — ABNORMAL HIGH (ref 42.0–78.0)
Neutrophils Absolute: 11.1 10*3/uL — ABNORMAL HIGH (ref 1.7–8.6)
PLT CT: 309 10*3/uL (ref 130–440)
RBC: 3.42 10*6/uL — ABNORMAL LOW (ref 3.80–5.00)
RDW: 13.6 % (ref 11.0–14.0)
WBC: 12.8 10*3/uL — ABNORMAL HIGH (ref 4.0–11.0)

## 2021-09-04 MED ORDER — FERROUS SULFATE 325 (65 FE) MG PO TABS
325.0000 mg | ORAL_TABLET | Freq: Every morning | ORAL | Status: DC
Start: 2021-09-04 — End: 2021-09-05
  Administered 2021-09-04: 325 mg via ORAL
  Filled 2021-09-04 (×2): qty 1

## 2021-09-04 MED ORDER — DOCUSATE SODIUM 100 MG PO CAPS
100.0000 mg | ORAL_CAPSULE | Freq: Two times a day (BID) | ORAL | Status: DC
Start: 2021-09-04 — End: 2021-09-05
  Filled 2021-09-04: qty 1

## 2021-09-04 NOTE — Progress Notes (Signed)
OBSTETRICS - ANTEPARTUM PROGRESS NOTE    09/04/2021 7:55 AM        Christine Andersen is a 36 y.o.G2P0010 with Estimated Date of Delivery: 12/08/21 and at [redacted]w[redacted]d gestation who was admitted to the antepartum service for vaginal bleeding with diagnosis of placenta previa. Patient notes no active bleeding, just some brownish mucus discharge. Patient denies loss of fluid, decreased fetal movement, and uterine contractions. Patient reports good fetal movement.    Physical Exam:  Patient Vitals for the past 24 hrs:   BP Temp Temp src Pulse Resp SpO2   09/04/21 0722 109/56 98.5 F (36.9 C) Oral (!) 111 16 --   09/03/21 1933 101/55 97.9 F (36.6 C) Oral (!) 109 17 --   09/03/21 1741 90/48 -- -- (!) 109 -- --   09/03/21 1200 90/52 -- -- (!) 113 -- 96 %   09/03/21 1100 105/56 -- -- (!) 108 -- 95 %   09/03/21 1000 119/59 -- -- (!) 114 -- --   09/03/21 0900 116/57 -- -- (!) 111 -- --   09/03/21 0800 109/56 -- -- (!) 107 -- 96 %     FHTs: 140, reassuring fetal surveillance  UCs: none on external tocometer, none perceived by patient  Cx: (Per not indicated at present.    General: No apparent distress; appears comfortable.  Abdomen: Gravid, NT with appropriate FH.  Back: No CVAT, atraumatic, no point tenderness.  Extremities: No cyanosis or erythema. Mild bilateral LE edema. Symmetric. No calf pain.     Prenatal Care Labs:  Lab Results   Component Value Date    ABORH O Positive 09/02/2021    HEPBSAG Negative 04/20/2021    RPR Nonreactive 04/20/2021    RUBELLAABIGG immunue 04/20/2021       Recent Labs:   Recent Labs   Lab 09/02/21  2345   WBC 10.5   Hemoglobin 10.6*   PLT CT 299     @labs72 @    Assessment and Plan:  1. IUP in 36 y.o.G2P0010 at [redacted]w[redacted]d gestation.  2. Admitted for vaginal bleeding with diagnosis of placenta previa  3. No evidence of acute maternal or fetal compromise.  4. Reassuring fetal surveillance.  5. Continue current care , have asked MFm to see patient today.  6. Had candid discussion with patient and husband  about the previa diagnosis and sentinel bleed, and potential for life-threatening hemorrhage.  7. S/p BMZ x 2 and mag.      Betsy Pries, MD

## 2021-09-04 NOTE — Consults (Signed)
MFM INPATIENT CONSULTATION  [facility]   [address], [address]  [place], [place] [address]  [phone]      Date Time: 09/04/2021  2:30 PM  Patient Name: Christine Andersen  Requesting Physician: Cresenciano Lick, MD  [address]  [place],  [place] [address]    Reason for Consultation:   Placenta Previa with vaginal bleeding     Pregnancy Risk Factors   Christine Andersen is a 36 yo G2 P0010 who presents alone today. She was mopping on Saturday and lifted a bucket. In the evening she had bright red bleeding- she says about a 1/4 cup with a long clot a little larger than a quarter. Prior to this she had had some bleeding after amniocentesis in February and has had brown spotting on and off since then. Since Saturday she has not had an more red bleeding but has some brown spotting still. She has no other symptoms. Labs have been stable, fetal cell stain is negative and her fetal monitoring has been reassuring. She received steroids.     Past Medical History:     Past Medical History:   Diagnosis Date    ADHD (attention deficit hyperactivity disorder)     PCOS (polycystic ovarian syndrome)     Placenta previa     Seizures        Past Surgical History:     Past Surgical History:   Procedure Laterality Date    REDUCTION MAMMAPLASTY Bilateral 2009    REVISION OF SCAR Bilateral     breast    TONSILLECTOMY         Family History:   History reviewed. No pertinent family history.    Social History:     Social History     Socioeconomic History    Marital status: Single   Tobacco Use    Smoking status: Former     Packs/day: 0.25     Types: Cigarettes     Quit date: 10/02/2013     Years since quitting: 7.9    Smokeless tobacco: Never   Vaping Use    Vaping status: Never Used   Substance and Sexual Activity    Alcohol use: Yes     Comment: socially    Drug use: No    Sexual activity: Yes     Partners: Male       Allergies:     Allergies   Allergen Reactions    Keppra  [Levetiracetam]      Depression, anger, suicidal thoughts       Medications:     No current facility-administered medications on file prior to encounter.     Current Outpatient Medications on File Prior to Encounter   Medication Sig Dispense Refill    ferrous sulfate 325 (65 FE) MG tablet Take 325 mg by mouth 2 (two) times daily      Magnesium 250 MG Tab Take 1 tablet by mouth Once a day at 6:00am      Prenatal Vit-Fe Fumarate-FA (PRENATAL VITAMINS PO) Take 1 tablet by mouth      amphetamine-dextroamphetamine (ADDERALL) 30 MG tablet Take 30 mg by mouth 2 (two) times daily.      ketorolac (TORADOL) 10 MG tablet Take 1 tablet (10 mg total) by mouth 3 (three) times daily as needed for Pain 15 tablet 0    lamoTRIgine (LAMICTAL) 200 MG tablet Take 1 tablet (200 mg total) by mouth 2 (  two) times daily. 60 tablet 0       Review of Systems:   A comprehensive review of systems was: Negative except what is stated in HPI.     Physical Exam:   BP 109/56   Pulse (!) 111   Temp 98.5 F (36.9 C) (Oral)   Resp 16   Ht 5\' 5"  (1.651 m)   Wt 254 lb 12.8 oz (115.6 kg)   LMP 08/05/2020   SpO2 96%   BMI 42.40 kg/m     No intake or output data in the 24 hours ending 09/04/21 1430    Gen: WDWN 36 y.o. yo F NAD    Fetal tracing:  Tocometer: No contractions  FH: Reactive, no decelerations    Labs Reviewed:     Recent Results (from the past 72 hour(s))   Urine Drug Screen - No Confirmation    Collection Time: 09/02/21  8:00 PM   Result Value Ref Range    Urine Creatinine Random 35 mg/dL    pH, Urine 6.1 5.0 - 8.0 pH    Urine Specific Gravity 1.009 1.001 - 1.040    Urine Amphetamine Negative Negative    Urine Barbiturates Negative Negative    Urine Benzodiazepines Negative Negative    Urine Cannabinoids Negative Negative    Urine Cocaine Negative Negative    Urine Methadone Screen Negative Negative    Urine Opiates Negative Negative    Urine Oxycodone Negative Negative    Urine Phencyclidine Negative Negative    Propoxyphene Negative  Negative    Urine Buprenorphine Negative Negative    Urine Ecstasy Screen Negative Negative    Urine Fentanyl Screen Negative Negative   Type and Screen    Collection Time: 09/02/21 11:45 PM   Result Value Ref Range    ABO Rh O Positive     AB Screen NEGATIVE    CBC and differential    Collection Time: 09/02/21 11:45 PM   Result Value Ref Range    WBC 10.5 4.0 - 11.0 K/cmm    RBC 3.58 (L) 3.80 - 5.00 M/cmm    Hemoglobin 10.6 (L) 12.0 - 16.0 gm/dL    Hematocrit 16.1 (L) 36.0 - 48.0 %    MCV 86 80 - 100 fL    MCH 30 28 - 35 pg    MCHC 34 32 - 36 gm/dL    RDW 09.6 04.5 - 40.9 %    PLT CT 299 130 - 440 K/cmm    MPV 7.1 6.0 - 10.0 fL    Neutrophils % 69.6 42.0 - 78.0 %    Lymphocytes 22.1 15.0 - 46.0 %    Monocytes 6.4 3.0 - 15.0 %    Eosinophils % 1.3 0.0 - 7.0 %    Basophils % 0.5 0.0 - 3.0 %    Neutrophils Absolute 7.3 1.7 - 8.6 K/cmm    Lymphocytes Absolute 2.3 0.6 - 5.1 K/cmm    Monocytes Absolute 0.7 0.1 - 1.7 K/cmm    Eosinophils Absolute 0.1 0.0 - 0.8 K/cmm    Basophils Absolute 0.1 0.0 - 0.3 K/cmm   PT/APTT    Collection Time: 09/02/21 11:45 PM   Result Value Ref Range    PT 10.0 9.4 - 11.5 sec    PT INR 1.0 0.9 - 1.1    aPTT 24.5 24.0 - 34.0 sec   Fibrinogen    Collection Time: 09/02/21 11:45 PM   Result Value Ref Range    Fibrinogen 462 181 -  469 mg/dL   Fetal Cell Stain    Collection Time: 09/02/21 11:45 PM   Result Value Ref Range    Kleihauer Betke Negative    Comprehensive metabolic panel    Collection Time: 09/02/21 11:45 PM   Result Value Ref Range    Sodium 137 136 - 147 mMol/L    Potassium 3.6 3.5 - 5.3 mMol/L    Chloride 105 98 - 110 mMol/L    CO2 24 20 - 30 mMol/L    Calcium 9.5 8.5 - 10.5 mg/dL    Glucose 98 71 - 99 mg/dL    Creatinine 9.14 7.82 - 1.20 mg/dL    BUN 7 7 - 22 mg/dL    Protein, Total 5.9 (L) 6.0 - 8.3 gm/dL    Albumin 2.7 (L) 3.5 - 5.0 gm/dL    Alkaline Phosphatase 60 40 - 145 U/L    ALT 10 0 - 55 U/L    AST (SGOT) 11 10 - 42 U/L    Bilirubin, Total 0.2 0.1 - 1.2 mg/dL     Albumin/Globulin Ratio 0.84 0.80 - 2.00 Ratio    Anion Gap 11.6 7.0 - 18.0 mMol/L    BUN / Creatinine Ratio 11.3 10.0 - 30.0 Ratio    EGFR 121 60 - 150 mL/min/1.80m2    Osmolality Calculated 272 (L) 275 - 300 mOsm/kg    Globulin 3.2 2.0 - 4.0 gm/dL   Vaginal Rectal Group B Strep by PCR    Collection Time: 09/03/21 12:24 AM    Specimen: Vaginal/Rectal   Result Value Ref Range    Group B Strep Negative for Group B Strep Negative for Group B Strep   Stool Culture ESBL    Collection Time: 09/03/21 12:24 AM    Specimen: Rectal Swab   Result Value Ref Range    Culture Result Culture In Progress    Group B Streptococcus Culture    Collection Time: 09/03/21 12:24 AM    Specimen: Vaginal/Rectal   Result Value Ref Range    Culture Result No Group B Strep Isolated - Day 1, Reincubate    CBC and differential    Collection Time: 09/04/21  9:44 AM   Result Value Ref Range    WBC 12.8 (H) 4.0 - 11.0 K/cmm    RBC 3.42 (L) 3.80 - 5.00 M/cmm    Hemoglobin 10.0 (L) 12.0 - 16.0 gm/dL    Hematocrit 95.6 (L) 36.0 - 48.0 %    MCV 87 80 - 100 fL    MCH 29 28 - 35 pg    MCHC 34 32 - 36 gm/dL    RDW 21.3 08.6 - 57.8 %    PLT CT 309 130 - 440 K/cmm    MPV 7.4 6.0 - 10.0 fL    Neutrophils % 86.9 (H) 42.0 - 78.0 %    Lymphocytes 9.3 (L) 15.0 - 46.0 %    Monocytes 3.1 3.0 - 15.0 %    Eosinophils % 0.5 0.0 - 7.0 %    Basophils % 0.2 0.0 - 3.0 %    Neutrophils Absolute 11.1 (H) 1.7 - 8.6 K/cmm    Lymphocytes Absolute 1.2 0.6 - 5.1 K/cmm    Monocytes Absolute 0.4 0.1 - 1.7 K/cmm    Eosinophils Absolute 0.1 0.0 - 0.8 K/cmm    Basophils Absolute 0.0 0.0 - 0.3 K/cmm       Rads:   US OB Ltd 1 or more w/TV    Result Date: 09/02/2021  1. Evidence  of complete placenta previa. Anterior placenta is present. No abnormal fluid collections. 2. Cervix measures 3.9 cm and is closed. 3. Single live intrauterine pregnancy with an ultrasound age of 24 weeks 5 days. ReadingStation:[email or web address]     MFM ULTRASOUND 09/04/21  Active singleton fetus without any new anomalies  visualized on limited routine anatomic reexamination. Biometry suggests a fetus with an EFW at the approximately 62% for gestational age.  Placenta is anterior complete previa.  Thank you for allowing our unit to contribute to your patient's care.     Discussion/Plan:       Anagha is a 36 yo G2 P0010 who presents alone today. She was mopping on Saturday and lifted a bucket. In the evening she had bright red bleeding- she says about a 1/4 cup with a long clot a little larger than a quarter. Prior to this she had had some bleeding after amniocentesis in February and has had brown spotting on and off since then. Since Saturday she has not had an more red bleeding but has some brown spotting still. She has no other symptoms. Labs have been stable, fetal cell stain is negative and her fetal monitoring has been reassuring. She received steroids.     We reviewed the concern with bleeding in the setting of placenta previa and the unpredictability of further bleeding. Further bleeding is more and more likely with each subsequent bleed and also this increased the risk of significant bleed and preterm delivery. Recommend inpatient monitoring until 48-72 hours without bleeding. If further bleeding she will need to be monitored longer. Delivery would be recommended for significant bleeding, fetal distress, or active labor. Cesarean delivery is needed for placenta previa to prevent hemorrhage. Pelvic rest and light activity reviewed. Discussed with Dr. Darcel Bayley.     I spent:  10 minutes reviewing records prior to the visit.  10 minutes in patient contact.  10 minutes charting, conferring with consultants, etc.    _30____ Total Time        Lura Em, DO  09/04/2021  2:30 PM

## 2021-09-04 NOTE — UM Notes (Signed)
[facility] Utilization Management Review Sheet    Facility :  [facility]    NAME: Christine Andersen  MR#: [id number]    CSN#: [id number]    ROOM: 2709/2709-A AGE: 36 y.o.    Date of Birth: August 22, 1985    ADMIT DATE AND TIME: 09/02/2021  9:45 PM      PATIENT CLASS: Inpatient     ATTENDING PHYSICIAN: Cresenciano Lick, MD  PAYOR:Payor: [employer] / Plan: [employer] POS / Product Type: COMMERCIAL /     Subscriber ID:  [id number]     AUTH #:  [id number]    DIAGNOSIS:  Principal Problem:    Pregnancy  Active Problems:    Placenta previa in second trimester        ICD-10-CM    1. Pregnancy, unspecified gestational age  Z36.90 MFM Ultrasound     MFM Ultrasound          HISTORY:   Past Medical History:   Diagnosis Date    ADHD (attention deficit hyperactivity disorder)     PCOS (polycystic ovarian syndrome)     Placenta previa     Seizures        DATE OF REVIEW: 09/04/2021    VITALS: BP 130/64   Pulse (!) 104   Temp 98.2 F (36.8 C) (Oral)   Resp 16   Ht 1.651 m (5\' 5" )   Wt 115.6 kg (254 lb 12.8 oz)   LMP 08/05/2020   SpO2 96%   BMI 42.40 kg/m     Active Hospital Problems    Diagnosis    Pregnancy    Placenta previa in second trimester     040123:  G2P0 @ 26 wks 1 day gestation admitted with vaginal bleeding.  She passed a golf ball sized clot.  Patient with known placenta previa.  VS:  109/59, 98.4, 106, 20, FHR 145.  No contractions on toco.  Active fetal movement.   Labs:  WBC 10.5, H/H 10.6;30.9, PLT 299, osmolality calculated 272, albumin 2.7, protein total 5.9, PT 10.0, PT INR 1.0, APTT 24.5, fibrinogen 462. GBS pending.   OB US:  There is a single live intrauterine gestation in cephalic presentation. There is an anterior placenta with evidence of complete previa. No abnormal fluid collections are noted.   There is a normal amount of amniotic fluid. Cervix measures 3.9 cm in length and is closed.   Composite ultrasound age is 24 weeks 5 days. Fetal heart activity measures 147 beats per minutes.   L&D, EFM, Toco,  BMZ IM qd x 2, MFM consult, NICU consult    [id number]:  G2P0 @ 26 wks 2 days gestation with placenta previa and bleeding.  VS:  115/59, 103, 17, FHR 145.  No contractions.  Red/brown blood from vagina when wiping.  Fetal NST occasional mild variable deceleration.  Mild BLE edema.   L&D, EFM, Toco, IVF at 75 ml/hr, BMZ IM seconds dose, Magnesium sulfate 4 g bolus to 2 g/hr IV gtt, VS q 15 min x 4, q 30 min x 2, q 1 hr while on MgSO4.      [id number]:  G2P0 @ 26 wks 3 days gestation admitted with vaginal bleeding with known placenta previa.  Reports brown mucous per vagina.    Labs:  WBC 12.8, H/H 10.0, 29.8, PLT 309, neutrophils 86.9%, lymphocytes 9.3, ANC 11.1  Assessment and Plan:  1. IUP in 36 y.o.G2P0010 at [redacted]w[redacted]d gestation.  2. Admitted for vaginal bleeding with diagnosis of placenta previa  3. No  evidence of acute maternal or fetal compromise.  4. Reassuring fetal surveillance.  5. Continue current care , have asked MFm to see patient today.  6. Had candid discussion with patient and husband about the previa diagnosis and sentinel bleed, and potential for life-threatening hemorrhage.  7. S/p BMZ x 2 and mag.  MFM consult:  We reviewed the concern with bleeding in the setting of placenta previa and the unpredictability of further bleeding. Further bleeding is more and more likely with each subsequent bleed and also this increased the risk of significant bleed and preterm   delivery. Recommend inpatient monitoring until 48-72 hours without bleeding. If further bleeding she will need to be monitored longer. Delivery would be recommended for significant bleeding, fetal distress, or active labor. Cesarean delivery is needed   for placenta previa to prevent hemorrhage. Pelvic rest and light activity reviewed.    MCG:  MG-OBD    Rush Barer. Imri Lor, Airline pilot [phone]  Fax:  [phone]  [name]

## 2021-09-04 NOTE — Plan of Care (Signed)
Assumed care of pt. Pt reports no bright red bleeding overnight, just some brown discharge. 0/10 pain, no PIH symptoms, see flowsheets for physical assessment, VSS. Continuous monitoring changed to BID per Dr. Darcel Bayley. Plan to see MFM today.     Problem: Antenatal Care Plan  Goal: Maintain pregnancy to a viable gestational age and as close to term as medically indicated  Outcome: Progressing     Problem: Bleeding in Pregnancy/Abnormal Placentation  Goal: Maintain hemodynamic stability of mother and fetus  Outcome: Progressing     Problem: Blood Disorders/Anemia in Pregnancy  Goal: Maintain hemodynamic stability and prevent thrombosis  Outcome: Progressing

## 2021-09-04 NOTE — Progress Notes (Signed)
Pt stable, no complaint.  No bleeding, no UCs, reports good fetal movement.  MFM saw patient today.  If remains stable overnight and no further bleeding can be discharged home tomorrow.   Restrictions and precautions reviewed.

## 2021-09-05 NOTE — Discharge Instr - AVS First Page (Signed)
raCall your OB Provider, or return to the hospital, if you have any of the following signs:     If more than 36 weeks, regular painful contractions every 3-5 minutes for one hour that increase in strength.     If less than 36 weeks, 6 or more contractions in an hour.     Persistent nausea, vomiting or diarrhea; accompanied by the inability to keep liquids down.     Leaking of amniotic fluid, or water breaking; this can be a gush or small trickle.     Vaginal bleeding, especially any bright red bleeding like a period or passing clots (it is normal to have spotting after vaginal exam or intercourse).     Concern about a decrease in your baby's movements.     Temperature greater than 100.4(F) orally.     Severe headache which is not relieved 60 minutes after taking Tylenol (Acetaminophen); Blurry vision or spots before your eyes, not just when you suddenly stand up; Severe heartburn or pain on the upper rights side of your abdomen that is not relieved by an antacid; Sudden increase in swelling in your face, hands, or feet.

## 2021-09-05 NOTE — Plan of Care (Signed)
Patient for discharge to home today.    Problem: Antenatal Care Plan  Goal: Maintain pregnancy to a viable gestational age and as close to term as medically indicated  Outcome: Completed     Problem: Bleeding in Pregnancy/Abnormal Placentation  Goal: Maintain hemodynamic stability of mother and fetus  Outcome: Completed     Problem: Blood Disorders/Anemia in Pregnancy  Goal: Maintain hemodynamic stability and prevent thrombosis  Outcome: Completed

## 2021-09-05 NOTE — Discharge Summary (Signed)
OBSTETRICS - DISCHARGE SUMMARY    09/05/2021 9:31 AM    Admission Date: 09/02/2021  Discharge Date: 09/05/21     Discharge Diagnosis:  35yo G2P0010 @26 .[redacted]wks GA with placenta previa.    Patient Active Problem List   Diagnosis    Fall involving bed as cause of accidental injury in home as place of occurrence    Laceration of face, complex, initial encounter    Seizure disorder    Seizures    Fall against object, initial encounter    PCOS (polycystic ovarian syndrome)    Depression with anxiety    Benzodiazepine abuse    Pregnancy    Placenta previa in second trimester       Immunization History   Administered Date(s) Administered    Influenza Vacc Quad MDCK cell cult 4 yrs & up 03/13/2019    Influenza quad 6 MOS to 64 YRS (Flulaval/Fluarix) 02/21/2015, 04/12/2016, 05/05/2018    Influenza quadrivalent (IM) 6 months & up PRESERVED (Afluria/Fluzone) 02/22/2017       Hospital Course: Christine Andersen is a 36 y.o. female admitted to the [facility] for vaginal bleeding in setting of placenta previa.  She had no further episodes of bleeding since admission on 09/02/21. The patient's vital signs remained stable and the patient remained afebrile throughout her hospitalization. She was discharged to home in stable condition.    Discharge Medications:  Current Discharge Medication List        CONTINUE these medications which have NOT CHANGED    Details   ferrous sulfate 325 (65 FE) MG tablet Take 325 mg by mouth 2 (two) times daily      Magnesium 250 MG Tab Take 1 tablet by mouth Once a day at 6:00am      Prenatal Vit-Fe Fumarate-FA (PRENATAL VITAMINS PO) Take 1 tablet by mouth      lamoTRIgine (LAMICTAL) 200 MG tablet Take 1 tablet (200 mg total) by mouth 2 (two) times daily.  Qty: 60 tablet, Refills: 0           STOP taking these medications       amphetamine-dextroamphetamine (ADDERALL) 30 MG tablet        ketorolac (TORADOL) 10 MG tablet            Discharge Followup:  Unless otherwise instructed:  Call  [facility] for follow up appointment in 1 week    Discharge Instructions:  Patient to follow discharge instructions as provided at the time of discharge.    Truddie Crumble, MD

## 2021-09-05 NOTE — Progress Notes (Addendum)
OBSTETRICS - ANTEPARTUM PROGRESS NOTE    09/05/2021 9:26 AM        Christine Andersen is a 36 y.o.G2P0010 with Estimated Date of Delivery: 12/08/21 and at [redacted]w[redacted]d gestation who was admitted to the antepartum service for vaginal bleeding with diagnosis of placenta previa. Patient notes light brown discharge this morning without any further bleeding. Patient denies LOF, VB, and UCs. Additionally, she reports good FM.    Physical Exam:  Patient Vitals for the past 24 hrs:   BP Temp Temp src Pulse Resp   09/05/21 0730 106/55 98.2 F (36.8 C) Oral 100 18   09/04/21 2146 132/60 98 F (36.7 C) Oral (!) 103 17   09/04/21 1928 133/68 98.6 F (37 C) Oral (!) 105 16   09/04/21 1504 130/64 98.2 F (36.8 C) Oral (!) 104 --     FHTs: 155, Variability: moderate  Pattern: accelerations  UCs: none on external tocometer  Cx: deferred    General: No apparent distress; appears comfortable.  Abdomen: Gravid, NT with appropriate FH.  Back: No CVAT, atraumatic, no point tenderness.  Extremities: No cyanosis or erythema. Mild bilateral LE edema. Symmetric. No calf pain. 2+ DTRs.    Prenatal Care Labs:  Lab Results   Component Value Date    ABORH O Positive 09/02/2021    HEPBSAG Negative 04/20/2021    RPR Nonreactive 04/20/2021    RUBELLAABIGG immunue 04/20/2021       Recent Labs:   Recent Labs   Lab 09/04/21  0944   WBC 12.8*   Hemoglobin 10.0*   PLT CT 309       Assessment and Plan:  1. IUP in 36 y.o.G2P0010 at [redacted]w[redacted]d gestation after first episode of vaginal bleeding in setting of complete placenta previa.  No further episodes of bleeding since 4/1.  2. No evidence of acute maternal or fetal compromise.  4. Reactive NST; reassuring fetal surveillance.  5. Discharge to home and followup for prenatal care as instructed and/or scheduled. Reviewed precautions for placenta previa and return with any new episodes of bleeding.    Truddie Crumble, MD

## 2021-09-05 NOTE — Progress Notes (Signed)
Discharged to home via wheelchair.  Verbalized understanding of discharge instructions.  For follow-up visit with [facility] in 1 week.  No new medications for discharge.

## 2021-09-07 ENCOUNTER — Encounter: Payer: Self-pay | Admitting: Obstetrics and Gynecology

## 2021-09-07 ENCOUNTER — Inpatient Hospital Stay
Admission: EM | Admit: 2021-09-07 | Discharge: 2021-09-26 | DRG: 787 | Disposition: A | Discharge status: Home / Self Care | Payer: Commercial Managed Care - POS | Source: Ambulatory Visit | Attending: Obstetrics & Gynecology | Admitting: Obstetrics & Gynecology

## 2021-09-07 ENCOUNTER — Other Ambulatory Visit: Payer: Self-pay

## 2021-09-07 DIAGNOSIS — F419 Anxiety disorder, unspecified: Secondary | ICD-10-CM | POA: Diagnosis present

## 2021-09-07 DIAGNOSIS — G40909 Epilepsy, unspecified, not intractable, without status epilepticus: Secondary | ICD-10-CM | POA: Diagnosis present

## 2021-09-07 DIAGNOSIS — F1310 Sedative, hypnotic or anxiolytic abuse, uncomplicated: Secondary | ICD-10-CM | POA: Diagnosis present

## 2021-09-07 DIAGNOSIS — Z3A29 29 weeks gestation of pregnancy: Secondary | ICD-10-CM

## 2021-09-07 DIAGNOSIS — Z141 Cystic fibrosis carrier: Secondary | ICD-10-CM

## 2021-09-07 DIAGNOSIS — O99324 Drug use complicating childbirth: Secondary | ICD-10-CM | POA: Diagnosis present

## 2021-09-07 DIAGNOSIS — O99344 Other mental disorders complicating childbirth: Secondary | ICD-10-CM | POA: Diagnosis present

## 2021-09-07 DIAGNOSIS — Z3490 Encounter for supervision of normal pregnancy, unspecified, unspecified trimester: Secondary | ICD-10-CM

## 2021-09-07 DIAGNOSIS — O4413 Placenta previa with hemorrhage, third trimester: Principal | ICD-10-CM | POA: Diagnosis present

## 2021-09-07 DIAGNOSIS — F909 Attention-deficit hyperactivity disorder, unspecified type: Secondary | ICD-10-CM | POA: Diagnosis present

## 2021-09-07 DIAGNOSIS — O9081 Anemia of the puerperium: Secondary | ICD-10-CM | POA: Diagnosis not present

## 2021-09-07 DIAGNOSIS — F32A Depression, unspecified: Secondary | ICD-10-CM | POA: Diagnosis present

## 2021-09-07 DIAGNOSIS — Z87891 Personal history of nicotine dependence: Secondary | ICD-10-CM

## 2021-09-07 DIAGNOSIS — Z28310 Unvaccinated for covid-19: Secondary | ICD-10-CM

## 2021-09-07 DIAGNOSIS — Z888 Allergy status to other drugs, medicaments and biological substances status: Secondary | ICD-10-CM

## 2021-09-07 DIAGNOSIS — D62 Acute posthemorrhagic anemia: Secondary | ICD-10-CM | POA: Diagnosis not present

## 2021-09-07 DIAGNOSIS — O99284 Endocrine, nutritional and metabolic diseases complicating childbirth: Secondary | ICD-10-CM | POA: Diagnosis present

## 2021-09-07 DIAGNOSIS — O99354 Diseases of the nervous system complicating childbirth: Secondary | ICD-10-CM | POA: Diagnosis present

## 2021-09-07 DIAGNOSIS — N939 Abnormal uterine and vaginal bleeding, unspecified: Principal | ICD-10-CM | POA: Diagnosis present

## 2021-09-07 DIAGNOSIS — E282 Polycystic ovarian syndrome: Secondary | ICD-10-CM | POA: Diagnosis present

## 2021-09-07 LAB — TYPE AND SCREEN
AB Screen: NEGATIVE
ABO Rh: O POS

## 2021-09-07 LAB — FIBRINOGEN: Fibrinogen: 372 mg/dL (ref 181–469)

## 2021-09-07 LAB — CBC AND DIFFERENTIAL
Basophils %: 0.5 % (ref 0.0–3.0)
Basophils Absolute: 0.1 10*3/uL (ref 0.0–0.3)
Eosinophils %: 1.6 % (ref 0.0–7.0)
Eosinophils Absolute: 0.2 10*3/uL (ref 0.0–0.8)
Hematocrit: 32.5 % — ABNORMAL LOW (ref 36.0–48.0)
Hemoglobin: 11.1 gm/dL — ABNORMAL LOW (ref 12.0–16.0)
Lymphocytes Absolute: 2.6 10*3/uL (ref 0.6–5.1)
Lymphocytes: 19.4 % (ref 15.0–46.0)
MCH: 30 pg (ref 28–35)
MCHC: 34 gm/dL (ref 32–36)
MCV: 87 fL (ref 80–100)
MPV: 7.4 fL (ref 6.0–10.0)
Monocytes Absolute: 0.9 10*3/uL (ref 0.1–1.7)
Monocytes: 6.7 % (ref 3.0–15.0)
Neutrophils %: 71.7 % (ref 42.0–78.0)
Neutrophils Absolute: 9.6 10*3/uL — ABNORMAL HIGH (ref 1.7–8.6)
PLT CT: 315 10*3/uL (ref 130–440)
RBC: 3.73 10*6/uL — ABNORMAL LOW (ref 3.80–5.00)
RDW: 14.2 % — ABNORMAL HIGH (ref 11.0–14.0)
WBC: 13.4 10*3/uL — ABNORMAL HIGH (ref 4.0–11.0)

## 2021-09-07 LAB — PT AND APTT
PT INR: 1 (ref 0.9–1.1)
PT: 10.3 s (ref 9.4–11.5)
aPTT: 23.6 s — ABNORMAL LOW (ref 24.0–34.0)

## 2021-09-07 MED ORDER — FENTANYL CITRATE (PF) 50 MCG/ML IJ SOLN (WRAP)
100.0000 ug | INTRAMUSCULAR | Status: DC | PRN
Start: 2021-09-07 — End: 2021-09-23
  Filled 2021-09-07: qty 2

## 2021-09-07 MED ORDER — NIFEDIPINE 10 MG PO CAPS
10.0000 mg | ORAL_CAPSULE | ORAL | Status: DC | PRN
Start: 2021-09-07 — End: 2021-09-23

## 2021-09-07 MED ORDER — DOCUSATE SODIUM 100 MG PO CAPS
100.0000 mg | ORAL_CAPSULE | Freq: Every day | ORAL | Status: DC
Start: 2021-09-07 — End: 2021-09-20
  Administered 2021-09-07 – 2021-09-19 (×13): 100 mg via ORAL
  Filled 2021-09-07 (×13): qty 1

## 2021-09-07 MED ORDER — FERROUS SULFATE 325 (65 FE) MG PO TABS
325.0000 mg | ORAL_TABLET | Freq: Every morning | ORAL | Status: DC
Start: 2021-09-07 — End: 2021-09-21
  Administered 2021-09-07 – 2021-09-21 (×15): 325 mg via ORAL
  Filled 2021-09-07 (×19): qty 1

## 2021-09-07 MED ORDER — FAMOTIDINE 20 MG PO TABS
20.0000 mg | ORAL_TABLET | Freq: Two times a day (BID) | ORAL | Status: DC
Start: 2021-09-07 — End: 2021-09-26
  Administered 2021-09-07 – 2021-09-26 (×34): 20 mg via ORAL
  Filled 2021-09-07 (×36): qty 1

## 2021-09-07 MED ORDER — LACTATED RINGERS IV SOLN
INTRAVENOUS | Status: DC
Start: 2021-09-07 — End: 2021-09-11

## 2021-09-07 MED ORDER — ONDANSETRON HCL 4 MG/2ML IJ SOLN
4.0000 mg | Freq: Three times a day (TID) | INTRAMUSCULAR | Status: DC | PRN
Start: 2021-09-07 — End: 2021-09-23
  Administered 2021-09-11 – 2021-09-23 (×8): 4 mg via INTRAVENOUS
  Filled 2021-09-07 (×8): qty 2

## 2021-09-07 MED ORDER — ACETAMINOPHEN 500 MG PO TABS
500.0000 mg | ORAL_TABLET | ORAL | Status: DC | PRN
Start: 2021-09-07 — End: 2021-09-23

## 2021-09-07 NOTE — H&P (Signed)
LABOR AND DELIVERY ADMISSION H&P    09/07/2021 1:47 PM    Christine Andersen is a 36 y.o.G2P0010 with an Estimated Date of Delivery: 12/08/21 at [redacted]w[redacted]d gestation who is being admitted for vaginal bleeding with a history of placenta previa. Patient notes gush of blood x 1 at home today while resting. Patient denies uterine contractions and abdominal pain. Patient reports prenatal care at [facility]. Prenatal care records The portion of the patient's prenatal care records that are currently available have been reviewed by myself.    She was admitted a few days ago for sentinel bleed, was discharged home 09/05/21 . Denies trauma or any aggravating activity. She received BMZ and mag earlier this week.    Past Medical History:  Past Medical History:   Diagnosis Date    ADHD (attention deficit hyperactivity disorder)     PCOS (polycystic ovarian syndrome)     Placenta previa     Seizures          Past Surgical History:  Past Surgical History:   Procedure Laterality Date    REDUCTION MAMMAPLASTY Bilateral 2009    REVISION OF SCAR Bilateral     breast    TONSILLECTOMY         Family History:  History reviewed. No pertinent family history.    Social History:  reports that she quit smoking about 7 years ago. Her smoking use included cigarettes. She smoked an average of .25 packs per day. She has never used smokeless tobacco. She reports current alcohol use. She reports that she does not use drugs.    Past Gynecologic History: None.    OB History   Gravida Para Term Preterm AB Living   2 0 0 0 1 0   SAB IAB Ectopic Multiple Live Births   1 0 0 0 0      # Outcome Date GA Lbr Len/2nd Weight Sex Delivery Anes PTL Lv   2 Current            1 SAB              Medications Prior to Admission   Medication Sig Dispense Refill Last Dose    ferrous sulfate 325 (65 FE) MG tablet Take 325 mg by mouth 2 (two) times daily       lamoTRIgine (LAMICTAL) 200 MG tablet Take 1 tablet (200 mg total) by mouth 2 (two) times daily. 60 tablet 0     Magnesium 250  MG Tab Take 1 tablet by mouth Once a day at 6:00am       Prenatal Vit-Fe Fumarate-FA (PRENATAL VITAMINS PO) Take 1 tablet by mouth          Allergies:   Allergies   Allergen Reactions    Keppra [Levetiracetam]      Depression, anger, suicidal thoughts       Physical Exam:       FHTs: 160, reassuring fetal surveillance.  UCs: none on external tocometer, none perceived by patient.  Cx: (Per contraindicated at present.    General: Patient w/o complaints and no UCs  Abdomen: Gravid, soft, NT with appropriate FH.   Extremities: No cyanosis or erythema. Mild bilateral LE edema. Symmetric. No calf pain.     Review of Systems:  A comprehensive review of systems was:  Negative     Prenatal Care Labs:  Lab Results   Component Value Date    ABORH O Positive 09/02/2021    HEPBSAG Negative 04/20/2021  RPR Nonreactive 04/20/2021    RUBELLAABIGG immunue 04/20/2021     Recent Labs:   Recent Labs   Lab 09/04/21  0944 09/02/21  2345   WBC 12.8* 10.5   Hemoglobin 10.0* 10.6*   PLT CT 309 299   ABO Rh  --  O Positive     Results       ** No results found for the last 24 hours. **          Radiology Results (24 Hour)       ** No results found for the last 24 hours. **          @radrsltall @    Assessment and Plan:  1. Pre-term IUP in 36 y.o.G2P0010 at [redacted]w[redacted]d gestation with dating confirmed by myself taking into consideration the available data.  2. Known placenta previa with bleeding again.  3. Reassuring fetal surveillance - CEFM/Toco.  4. Comfortable with no UCs and in no need of analgesia at this time.  5. Saw MFM few days ago.  6. Discussed plan for admission, close monitoring of bleeding, and delivery via cesarean if needed.  7. NICU aware.  8. IV access and labs ordered.    Betsy Pries, MD

## 2021-09-07 NOTE — Progress Notes (Signed)
Minimal blood on pad now, had a little more when went to bathroom.  Advised not to strain.  Vitals ok.   Labs ok. FHTs reassuring.  Monitor bleeding closely.

## 2021-09-08 LAB — CBC AND DIFFERENTIAL
Basophils %: 0.4 % (ref 0.0–3.0)
Basophils Absolute: 0 10*3/uL (ref 0.0–0.3)
Eosinophils %: 1.3 % (ref 0.0–7.0)
Eosinophils Absolute: 0.1 10*3/uL (ref 0.0–0.8)
Hematocrit: 30.6 % — ABNORMAL LOW (ref 36.0–48.0)
Hemoglobin: 10.5 gm/dL — ABNORMAL LOW (ref 12.0–16.0)
Lymphocytes Absolute: 2.5 10*3/uL (ref 0.6–5.1)
Lymphocytes: 21.8 % (ref 15.0–46.0)
MCH: 30 pg (ref 28–35)
MCHC: 34 gm/dL (ref 32–36)
MCV: 88 fL (ref 80–100)
MPV: 7.4 fL (ref 6.0–10.0)
Monocytes Absolute: 0.7 10*3/uL (ref 0.1–1.7)
Monocytes: 6.1 % (ref 3.0–15.0)
Neutrophils %: 70.4 % (ref 42.0–78.0)
Neutrophils Absolute: 7.9 10*3/uL (ref 1.7–8.6)
PLT CT: 282 10*3/uL (ref 130–440)
RBC: 3.5 10*6/uL — ABNORMAL LOW (ref 3.80–5.00)
RDW: 14 % (ref 11.0–14.0)
WBC: 11.3 10*3/uL — ABNORMAL HIGH (ref 4.0–11.0)

## 2021-09-08 NOTE — UM Notes (Addendum)
[facility] Utilization Management Review Sheet    Facility :  [facility]    NAME: Marely Apgar  MR#: [id number]    CSN#: [id number]    ROOM: 2709/2709-A AGE: 36 y.o.    Date of Birth: January 13, 1986    ADMIT DATE AND TIME: 09/07/2021  1:07 PM      PATIENT CLASS:  Inpatient     ATTENDING PHYSICIAN: Betsy Pries, MD  PAYOR:Payor: [employer] / Plan: CIG[employer] POS / Product Type: COMMERCIAL /     Subscriber ID:  [id number]     AUTH #: [id number] changed to [id number]    DIAGNOSIS:  Principal Problem:    Vaginal bleeding     Placenta Previa       HISTORY:   Past Medical History:   Diagnosis Date    ADHD (attention deficit hyperactivity disorder)     PCOS (polycystic ovarian syndrome)     Placenta previa     Seizures        DATE OF REVIEW: 09/08/2021    VITALS: BP 112/63   Pulse (!) 102   Temp 98.5 F (36.9 C) (Oral)   Resp 16   Ht 1.651 m (5\' 5" )   Wt 115.7 kg (255 lb)   LMP 08/05/2020   BMI 42.43 kg/m     Active Hospital Problems    Diagnosis    Vaginal bleeding     Admitted 09/02/21-09/05/21 with vaginal bleed with known placenta previa.    [id number]:  G2P0 @ 26 wks 6 days gestation admitted to L&D s/p a gush of blood per vagina while at rest.  VS: 130/64, 100, 16, 98.3, FHR 155.  Active fetal movement.  Denies contractions or abdominal pain.  S/P BMZ and MgSO4 earlier this week.    Labs:  WBC 13.4, H/H 11.1/32.5, PLT 315, RBC 3.73ANC 9.6, PT 10.3, PT INR 1.0, APTT 23.6, fibrinogen 372.  L&D,  EFM, Toco, VS q 2-4 hrs,  PIV x 2 lines, IVF at 50 ml/hr, Colace po qd, Ferrous sulfate po BID, Pepcid po q 12 hrs, Nifedipine 10-20 mg prn, Zofran prn, Tylenol po prn, Fentanyl IV prn.  Assessment and Plan:  1. Pre-term IUP in 36 y.o.G2P0010 at [redacted]w[redacted]d gestation with dating confirmed by myself taking into consideration the available data.  2. Known placenta previa with bleeding again.  3. Reassuring fetal surveillance - CEFM/Toco.  4. Comfortable with no UCs and in no need of analgesia at this time.  5. Saw MFM few days ago.  6.  Discussed plan for admission, close monitoring of bleeding, and delivery via cesarean if needed.  7. NICU aware.  8. IV access and labs ordered.       [id number]:  G2P0 @ 27 wks 0 days gestation admitted with second vaginal bleed in setting of placenta previa.  Vaginal spotting, dark brown blood overnight. Quarter size bright red blood on pad last evening. No contractions.  Active fetal movement. FHR 150.  Fetal NST reactive.    Labs:  WBC 11.3, H/H 10.5/30.6, PLT 282, RBC 3.50, PLT 282.  L&D, Fetal NST TID and prn,  VS q 2-4 hrs, IVF at 50 ml/hr-discontinued this am. PIV x 2  to saline lock, Colace po qd, Pepcid po q 12 hrs, Nifedipine prn, Zofran prn, Tylenol po prn, Fentanyl IV prn.    MCG:  MG-OBD    Rush Barer. Humaira Sculley, Airline pilot [phone]  Fax:  [phone]  [email or web address]

## 2021-09-08 NOTE — Progress Notes (Signed)
OBSTETRICS - ANTEPARTUM PROGRESS NOTE    09/08/2021 8:57 AM        Christine Andersen is a 36 y.o.G2P0010 with Estimated Date of Delivery: 12/08/21 and at [redacted]w[redacted]d gestation who was admitted to the antepartum service for second vaginal bleed in setting of placenta previa. Patient notes no complaints other than some vaginal spotting, dark brown overnight; denies cramping, frank VB, UCs, decreased FM and recent illness.  Reports quarter size bright red blood on pad last evening.    Previously admitted 09/02/21-09/05/21 for sentinel bleed.  Received betamethasone series 09/02/21 and 09/03/21 and magnesium.    Physical Exam:  Patient Vitals for the past 24 hrs:   BP Temp Temp src Pulse Resp Height Weight   09/07/21 2154 126/58 98.7 F (37.1 C) Oral 96 17 -- --   09/07/21 2057 118/57 -- -- 98 16 -- --   09/07/21 1758 -- -- -- -- -- 1.651 m (5\' 5" ) 115.7 kg (255 lb)   09/07/21 1756 125/58 98.3 F (36.8 C) Oral 91 16 -- --   09/07/21 1334 130/64 -- -- 100 -- -- --     FHTs: 150, Variability: moderate  Pattern: accelerations  UCs: none on external tocometer  Cx: deferred    General: No apparent distress; appears comfortable.  Abdomen: Gravid, NT with appropriate FH.  Back: No CVAT, atraumatic, no point tenderness.  Extremities: No cyanosis or erythema. Mild bilateral LE edema. Symmetric. No calf pain. 2+ DTRs.    Prenatal Care Labs:  Lab Results   Component Value Date    ABORH O Positive 09/07/2021    HEPBSAG Negative 04/20/2021    RPR Nonreactive 04/20/2021    RUBELLAABIGG immunue 04/20/2021       Recent Labs:   Recent Labs   Lab 09/08/21  0548   WBC 11.3*   Hemoglobin 10.5*   PLT CT 282     Type and screen: O positive    Assessment and Plan:  1. IUP in 36 y.o.G2P0010 at [redacted]w[redacted]d gestation.  2. Admitted for second episode of vaginal bleeding in setting of complete placenta previa  3. No evidence of acute maternal or fetal compromise.  4. Reactive NST; reassuring fetal surveillance.  NST BID and PRN  5. Continue current care  inpatient    Truddie Crumble, MD

## 2021-09-09 NOTE — Plan of Care (Signed)
Problem: Antenatal Care Plan  Goal: Maintain pregnancy to a viable gestational age and as close to term as medically indicated  Description: Interventions:  1. Monitor and assess vital signs  2. Monitor maternal weight  3. Monitor urine dips  4. Monitor intake and output.  Notify LIP if urine output is less than 30 mL/hour.   5. Initiate diet as ordered  6. Activity as ordered/assist with ADLs as needed  7. Encourage frequent position changes (left lateral side lying, Trendelenburg, other)  8. Encourage/provide adequate hydration  9. Encourage patient to empty bladder at regular intervals  10. Monitor uterine activity  11. Assess for vaginal leaking of fluid  12. Assess for vaginal bleeding  13. Monitor fetal movement  14. Provide Fetal Kick Count Sheet  15. Fetal surveillance as ordered. Notify LIP of abnormal results.  16. TOCO as ordered  17. VTE Prevention: Administer anticoagulant(s) and/or apply anti-embolism stockings/devices as ordered  18. Monitor/assess lab values and report abnormal values  19. Review ultrasound results.  Notify LIP of abnormal results.  20. Provide emotional support  21. Verify knowledge regarding diagnosis and medication side effects  22. Integrate cultural needs  23. Perinatal consults  Outcome: Progressing  Flowsheets (Taken 09/07/2021 1804 by Laitres, Sara D, RN)  Maintain pregnancy to a viable gestational age and as close to term as medically indicated:   Monitor and assess vital signs   Monitor maternal weight   Initiate diet as ordered   Activity as ordered/assist with ADLs as needed   Encourage frequent position changes (left lateral side lying, Trendelenburg, other)   Encourage/provide adequate hydration   Assess for vaginal leaking of fluid   Monitor uterine activity   Encourage patient to empty bladder at regular intervals   Assess for vaginal bleeding   Monitor fetal movement   TOCO as ordered   Fetal surveillance as ordered. Notify LIP of abnormal results.   VTE Prevention:  Administer anticoagulant(s) and/or apply anti-embolism stockings/devices as ordered   Monitor/assess lab values and report abnormal values   Review ultrasound results.  Notify LIP of abnormal results.   Verify knowledge regarding diagnosis and medication side effects   Provide emotional support   Integrate cultural needs   Perinatal consults

## 2021-09-09 NOTE — Progress Notes (Signed)
OBSTETRICS - ANTEPARTUM PROGRESS NOTE    09/09/2021 6:37 AM        Saunders Revel Vermette is a 36 y.o.G2P0010 with Estimated Date of Delivery: 12/08/21 and at [redacted]w[redacted]d gestation who was admitted to the antepartum service for second vaginal bleed in setting of placenta previa. Patient notes two episodes of bright red bleeding yesterday morning, nickel size on pad, after having a bowel movement. Patient denies bright red vaginal bleeding since and has only noted light brown staining. Patient reports good fetal movement.  She denies leaking of fluid or contractions.    Physical Exam:  Patient Vitals for the past 24 hrs:   BP Temp Temp src Pulse Resp   09/08/21 2200 124/63 98.3 F (36.8 C) Oral 100 --   09/08/21 1836 117/57 98.8 F (37.1 C) -- (!) 105 17   09/08/21 0850 -- 98.5 F (36.9 C) Oral -- --   09/08/21 0845 112/63 -- -- (!) 102 16     FHTs: 150, Variability: moderate  Pattern: accelerations  UCs: none on external tocometer  Cx: deferred    General: No apparent distress; appears comfortable.  Abdomen: Gravid, NT with appropriate FH.  Back: No CVAT, atraumatic, no point tenderness.  Extremities: No cyanosis or erythema. Mild bilateral LE edema. Symmetric. No calf pain. 2+ DTRs.    Prenatal Care Labs:  Lab Results   Component Value Date    ABORH O Positive 09/07/2021    HEPBSAG Negative 04/20/2021    RPR Nonreactive 04/20/2021    RUBELLAABIGG immunue 04/20/2021       Recent Labs:   Recent Labs   Lab 09/08/21  0548   WBC 11.3*   Hemoglobin 10.5*   PLT CT 282       Assessment and Plan:  1. IUP in 36 y.o.G2P0010 at [redacted]w[redacted]d gestation.  2. Admitted for second episode of vaginal bleeding in setting of complete placenta previa  3. No evidence of acute maternal or fetal compromise.  4. Reactive NST; reassuring fetal surveillance.  NST BID and PRN  5. Continue current care inpatient    Truddie Crumble, MD

## 2021-09-09 NOTE — Plan of Care (Addendum)
Pt G2P0, 27.1w, here for bleeding w/ hx of placenta previa.  Pt states she has had no bleeding this morning.  No leaking fluid.  Good movement of baby boy.  Otherwise feeling well today.  No headaches, visual changes or SOB.    Pt VSS.  NST this morning reactive/cat 1 and appropriate for GA.  No contractions noted with TOCO.    POC:   NST now BID.  Pt remains in house.  Will reassess.     Problem: Antenatal Care Plan  Goal: Maintain pregnancy to a viable gestational age and as close to term as medically indicated  Description: Interventions:  1. Monitor and assess vital signs  2. Monitor maternal weight  3. Monitor urine dips  4. Monitor intake and output.  Notify LIP if urine output is less than 30 mL/hour.   5. Initiate diet as ordered  6. Activity as ordered/assist with ADLs as needed  7. Encourage frequent position changes (left lateral side lying, Trendelenburg, other)  8. Encourage/provide adequate hydration  9. Encourage patient to empty bladder at regular intervals  10. Monitor uterine activity  11. Assess for vaginal leaking of fluid  12. Assess for vaginal bleeding  13. Monitor fetal movement  14. Provide Fetal Kick Count Sheet  15. Fetal surveillance as ordered. Notify LIP of abnormal results.  16. TOCO as ordered  17. VTE Prevention: Administer anticoagulant(s) and/or apply anti-embolism stockings/devices as ordered  18. Monitor/assess lab values and report abnormal values  19. Review ultrasound results.  Notify LIP of abnormal results.  20. Provide emotional support  21. Verify knowledge regarding diagnosis and medication side effects  22. Integrate cultural needs  23. Perinatal consults  Outcome: Progressing

## 2021-09-10 LAB — TYPE AND SCREEN
AB Screen: NEGATIVE
ABO Rh: O POS

## 2021-09-10 MED ORDER — SODIUM CHLORIDE (PF) 0.9 % IJ SOLN
3.0000 mL | INTRAMUSCULAR | Status: DC | PRN
Start: 2021-09-10 — End: 2021-09-23
  Administered 2021-09-15 – 2021-09-22 (×4): 3 mL via INTRAVENOUS

## 2021-09-10 MED ORDER — SODIUM CHLORIDE (PF) 0.9 % IJ SOLN
3.0000 mL | Freq: Three times a day (TID) | INTRAMUSCULAR | Status: DC
Start: 2021-09-10 — End: 2021-09-23
  Administered 2021-09-10 – 2021-09-21 (×18): 3 mL via INTRAVENOUS

## 2021-09-10 NOTE — Progress Notes (Signed)
OBSTETRICS - ANTEPARTUM PROGRESS NOTE    09/10/2021 12:01 PM        Christine Andersen is a 36 y.o.G2P0010 with Estimated Date of Delivery: 12/08/21 and at [redacted]w[redacted]d gestation who was admitted to the antepartum service for for second vaginal bleed in setting of placenta previa. Patient notes continued light brown discharge. Patient denies LOF, further bright red VB since 12/7, and UCs. Additionally, she reports good FM.     Physical Exam:  Patient Vitals for the past 24 hrs:   BP Temp Temp src Pulse Resp   09/10/21 0841 147/71 -- -- (!) 109 --   09/10/21 0838 -- 97.8 F (36.6 C) Oral -- 16   09/09/21 2200 128/64 98.4 F (36.9 C) Oral 99 18   09/09/21 1617 117/57 98.5 F (36.9 C) Oral (!) 107 18   09/09/21 1203 137/61 98.5 F (36.9 C) Oral (!) 110 16     FHTs: 155, Variability: moderate  Pattern: accelerations  UCs: none on external tocometer  Cx: deferred    General: No apparent distress; appears comfortable.  Abdomen: Gravid, NT with appropriate FH.  Back: No CVAT, atraumatic, no point tenderness.  Extremities: No cyanosis or erythema. Mild bilateral LE edema. Symmetric. No calf pain. 2+ DTRs.    Prenatal Care Labs:  Lab Results   Component Value Date    ABORH O Positive 09/07/2021    HEPBSAG Negative 04/20/2021    RPR Nonreactive 04/20/2021    RUBELLAABIGG immunue 04/20/2021       Recent Labs:   Recent Labs   Lab 09/08/21  0548   WBC 11.3*   Hemoglobin 10.5*   PLT CT 282       Assessment and Plan:  1. IUP in 36 y.o.G2P0010 at [redacted]w[redacted]d gestation.  2. Admitted for second episode of vaginal bleeding in setting of complete placenta previa  3. No evidence of acute maternal or fetal compromise.  4. Reactive NST; reassuring fetal surveillance.  NST BID and PRN  5. Continue current care inpatient  Truddie Crumble, MD

## 2021-09-10 NOTE — Plan of Care (Signed)
Problem: Antenatal Care Plan  Goal: Maintain pregnancy to a viable gestational age and as close to term as medically indicated  Description: Interventions:  1. Monitor and assess vital signs  2. Monitor maternal weight  3. Monitor urine dips  4. Monitor intake and output.  Notify LIP if urine output is less than 30 mL/hour.   5. Initiate diet as ordered  6. Activity as ordered/assist with ADLs as needed  7. Encourage frequent position changes (left lateral side lying, Trendelenburg, other)  8. Encourage/provide adequate hydration  9. Encourage patient to empty bladder at regular intervals  10. Monitor uterine activity  11. Assess for vaginal leaking of fluid  12. Assess for vaginal bleeding  13. Monitor fetal movement  14. Provide Fetal Kick Count Sheet  15. Fetal surveillance as ordered. Notify LIP of abnormal results.  16. TOCO as ordered  17. VTE Prevention: Administer anticoagulant(s) and/or apply anti-embolism stockings/devices as ordered  18. Monitor/assess lab values and report abnormal values  19. Review ultrasound results.  Notify LIP of abnormal results.  20. Provide emotional support  21. Verify knowledge regarding diagnosis and medication side effects  22. Integrate cultural needs  23. Perinatal consults  Outcome: Progressing  Flowsheets (Taken 09/07/2021 1804 by Army Fossa, RN)  Maintain pregnancy to a viable gestational age and as close to term as medically indicated:   Monitor and assess vital signs   Monitor maternal weight   Initiate diet as ordered   Activity as ordered/assist with ADLs as needed   Encourage frequent position changes (left lateral side lying, Trendelenburg, other)   Encourage/provide adequate hydration   Assess for vaginal leaking of fluid   Monitor uterine activity   Encourage patient to empty bladder at regular intervals   Assess for vaginal bleeding   Monitor fetal movement   TOCO as ordered   Fetal surveillance as ordered. Notify LIP of abnormal results.   VTE Prevention:  Administer anticoagulant(s) and/or apply anti-embolism stockings/devices as ordered   Monitor/assess lab values and report abnormal values   Review ultrasound results.  Notify LIP of abnormal results.   Verify knowledge regarding diagnosis and medication side effects   Provide emotional support   Integrate cultural needs   Perinatal consults

## 2021-09-11 MED ORDER — PRENATAL 27-0.8 MG PO TABS
1.0000 | ORAL_TABLET | Freq: Every day | ORAL | Status: DC
Start: 2021-09-11 — End: 2021-09-23
  Administered 2021-09-12 – 2021-09-21 (×10): 1 via ORAL
  Filled 2021-09-11 (×10): qty 1

## 2021-09-11 NOTE — Plan of Care (Signed)
Problem: Antenatal Care Plan  Goal: Maintain pregnancy to a viable gestational age and as close to term as medically indicated  Outcome: Progressing

## 2021-09-11 NOTE — Progress Notes (Signed)
OBSTETRICS - ANTEPARTUM PROGRESS NOTE    09/11/2021 7:51 AM        Christine Andersen is a 36 y.o.G2P0010 with Estimated Date of Delivery: 12/08/21 and at [redacted]w[redacted]d gestation who was admitted to the antepartum service for vaginal bleeding with diagnosis of placenta previa. Patient notes no complaints. Patient denies loss of fluid, frank vaginal bleeding, decreased fetal movement, and uterine contractions. Patient reports scant brownish mucus discharge.    Physical Exam:  Patient Vitals for the past 24 hrs:   BP Temp Temp src Pulse Resp SpO2   09/11/21 0523 129/59 -- -- 97 16 96 %   09/10/21 2000 130/62 98 F (36.7 C) Oral (!) 103 18 98 %   09/10/21 1502 137/67 98.7 F (37.1 C) Oral (!) 101 16 --   09/10/21 0841 147/71 -- -- (!) 109 -- --   09/10/21 0838 -- 97.8 F (36.6 C) Oral -- 16 --     FHTs: reassuring, with accels   UCs: none on external tocometer, none perceived by patient  Cx: (Per contraindicated at present.    General: No apparent distress; appears comfortable.  Abdomen: Gravid, NT with appropriate FH.  Back: No CVAT, atraumatic, no point tenderness.  Extremities: No cyanosis or erythema. Mild bilateral LE edema. Symmetric. No calf pain. .    Prenatal Care Labs:  Lab Results   Component Value Date    ABORH O Positive 09/10/2021    HEPBSAG Negative 04/20/2021    RPR Nonreactive 04/20/2021    RUBELLAABIGG immunue 04/20/2021       Recent Labs:   Recent Labs   Lab 09/08/21  0548   WBC 11.3*   Hemoglobin 10.5*   PLT CT 282     @labs72 @    Assessment and Plan:  1. IUP in 36 y.o.G2P0010 at [redacted]w[redacted]d gestation.  2. Admitted for vaginal bleeding with diagnosis of placenta previa  3. No evidence of acute maternal or fetal compromise.  4. Reassuring fetal surveillance.  5. Continue current care , inpatient    [redacted]w[redacted]d, MD

## 2021-09-12 NOTE — Progress Notes (Signed)
Went into pt room around 1600. Pt was a little tearful. Asked pt if everything was ok, pt stated yes, she just wants to go home. Offered to take pt outside in wheelchair for some fresh air, pt declined. Offered to give pt a crossword puzzle or coloring book, pt also declined. Pt stated her significant other will be in later this evening.

## 2021-09-12 NOTE — Plan of Care (Signed)
Problem: Antenatal Care Plan  Goal: Maintain pregnancy to a viable gestational age and as close to term as medically indicated  Outcome: Progressing

## 2021-09-12 NOTE — Progress Notes (Signed)
OBSTETRICS - ANTEPARTUM PROGRESS NOTE    09/12/2021 8:43 AM        Christine Andersen is a 36 y.o.G2P0010 with Estimated Date of Delivery: 12/08/21 and at [redacted]w[redacted]d gestation who was admitted to the antepartum service for vaginal bleeding with diagnosis of placenta previa. Patient notes no complaints. Patient denies loss of fluid, frank vaginal bleeding, decreased fetal movement, and uterine contractions. Patient reports scant brownish mucus discharge.  She feels "cooped up with terrible hospital food"    Physical Exam:  Patient Vitals for the past 24 hrs:   BP Temp Temp src Pulse Resp   09/12/21 0735 121/79 97.3 F (36.3 C) Temporal (!) 109 17   09/11/21 2232 113/66 98.4 F (36.9 C) Temporal (!) 104 16   09/11/21 2020 124/70 97 F (36.1 C) Temporal (!) 101 16   09/11/21 1532 125/71 97.5 F (36.4 C) Temporal 100 17   09/11/21 1204 132/88 97.2 F (36.2 C) Temporal (!) 109 17     FHTs: reassuring, with accels   UCs: none on external tocometer, none perceived by patient  Cx: (Per contraindicated at present.    General: No apparent distress; appears comfortable.  Abdomen: Gravid, NT with appropriate FH.  Back: No CVAT, atraumatic, no point tenderness.  Extremities: No cyanosis or erythema. Mild bilateral LE edema. Symmetric. No calf pain. .    Prenatal Care Labs:  Lab Results   Component Value Date    ABORH O Positive 09/10/2021    HEPBSAG Negative 04/20/2021    RPR Nonreactive 04/20/2021    RUBELLAABIGG immunue 04/20/2021       Recent Labs:   Recent Labs   Lab 09/08/21  0548   WBC 11.3*   Hemoglobin 10.5*   PLT CT 282     @labs72 @    Assessment and Plan:  1. IUP in 36 y.o.G2P0010 at [redacted]w[redacted]d gestation.  2. Admitted for vaginal bleeding with diagnosis of placenta previa  3. No evidence of acute maternal or fetal compromise.  4. Reassuring fetal surveillance.  5. Continue current care , inpatient  6. Can take wheelchair trip to cafeteria for 15-30 min with staff member    Cresenciano Lick, MD

## 2021-09-12 NOTE — Plan of Care (Signed)
Reviewed plan of care. Vitals WNL.  Notify nurse of needs, questions and concerns. Call light within reach and patient instructed on it's use. Plan of care updated and reviewed with patient.      Problem: Antenatal Care Plan  Goal: Maintain pregnancy to a viable gestational age and as close to term as medically indicated  Outcome: Progressing  Flowsheets (Taken 09/07/2021 1804 by Army Fossa, RN)  Maintain pregnancy to a viable gestational age and as close to term as medically indicated:   Monitor and assess vital signs   Monitor maternal weight   Initiate diet as ordered   Activity as ordered/assist with ADLs as needed   Encourage frequent position changes (left lateral side lying, Trendelenburg, other)   Encourage/provide adequate hydration   Assess for vaginal leaking of fluid   Monitor uterine activity   Encourage patient to empty bladder at regular intervals   Assess for vaginal bleeding   Monitor fetal movement   TOCO as ordered   Fetal surveillance as ordered. Notify LIP of abnormal results.   VTE Prevention: Administer anticoagulant(s) and/or apply anti-embolism stockings/devices as ordered   Monitor/assess lab values and report abnormal values   Review ultrasound results.  Notify LIP of abnormal results.   Verify knowledge regarding diagnosis and medication side effects   Provide emotional support   Integrate cultural needs   Perinatal consults

## 2021-09-12 NOTE — UM Notes (Addendum)
[facility] Utilization Management Review Sheet    Facility :  [facility]    NAME: Ezme Duch  MR#: [id number]    CSN#: [id number]    ROOM: 226/226-A AGE: 36 y.o.    Date of Birth: [birth year]    ADMIT DATE AND TIME: 09/07/2021  1:07 PM      PATIENT CLASS:  Inpatient     ATTENDING PHYSICIAN: Betsy Pries, MD  PAYOR:Payor: CIGNA / Plan: CIGNA POS / Product Type: COMMERCIAL /     Subscriber ID:  [id number]     AUTH #: [id number] changed to [id number]    DIAGNOSIS: Principal Problem:    Vaginal bleeding        HISTORY:   Past Medical History:   Diagnosis Date    ADHD (attention deficit hyperactivity disorder)     PCOS (polycystic ovarian syndrome)     Placenta previa     Seizures        DATE OF REVIEW: 09/12/2021    VITALS: BP 113/66   Pulse (!) 104   Temp 98.4 F (36.9 C) (Temporal)   Resp 16   Ht 1.651 m (5\' 5" )   Wt 115.7 kg (255 lb)   LMP 08/05/2020   SpO2 96%   BMI 42.43 kg/m     Active Hospital Problems    Diagnosis    Vaginal bleeding     10/05/2020:  G2P0 @ 27 wks 1 day gestation admitted for second vaginal bleed in setting of placenta previa.  She had bright red bleeding yesterday morning.  She continues to have light brown discharge.  No leaking of fluid or contractions.  VS:  119/60, 137/61, 98.3, 106, 16,FHR 150.  Fetal NST reactive. Active fetal movement.  L&D, Fetal NST BID and prn, Colace po qd, Pepcid po q 12 hrs, Ferrous sulfate po qd, Procardia po prn, Tylenol prn, Zofran prn, Type and screen q 72 hrs.   Delivery by C/S for maternal/fetal compromise.    [id number]:  G2P0 @ 27 wks 2 days gestation admitted with vaginal bleeding with known placenta previa.  Patient continues to have light brown vaginal discharge.  Mild BLE edema. No contractions on toco. VS:  147/71, 97.8, 109, 16, FHR 155. Active fetal movement.   Fetal NST reactive.  Labs:  O+, antibody screen negative.  L&D, Fetal NST BID and prn, Colace po qd, Pepcid po q 12 hrs, Ferrous sulfate po qd, Procardia po prn, Tylenol prn,  Zofran prn, IV to saline lock, Type and screen q 72 hrs.    329924:  G2P0 @ 27 wks 3 day gestation admitted with vaginal bleeding with known placenta previa.  Continues to have some brown discharge per vagina. VS:  132/88, 97.2, 109, 17, FHR 150.  Fetal NST reactive.  Active fetal movement.  L&D, Fetal NST BID and prn, Colace po qd, Pepcid po q 12 hrs, Ferrous sulfate po qd, Procardia po prn, Tylenol prn, Zofran prn IV x 1, IV to saline lock, Type and screen q 72 hrs.  Delivery by C/S for maternal/fetal compromise.    MCG:  MG-OBD    [id number]. Raquan Iannone, Rush Barer [phone]  Fax:  [phone]  [phone]

## 2021-09-13 LAB — CBC AND DIFFERENTIAL
Basophils %: 0.4 % (ref 0.0–3.0)
Basophils Absolute: 0 10*3/uL (ref 0.0–0.3)
Eosinophils %: 1 % (ref 0.0–7.0)
Eosinophils Absolute: 0.1 10*3/uL (ref 0.0–0.8)
Hematocrit: 31.2 % — ABNORMAL LOW (ref 36.0–48.0)
Hemoglobin: 10.8 gm/dL — ABNORMAL LOW (ref 12.0–16.0)
Lymphocytes Absolute: 2.7 10*3/uL (ref 0.6–5.1)
Lymphocytes: 24.3 % (ref 15.0–46.0)
MCH: 30 pg (ref 28–35)
MCHC: 35 gm/dL (ref 32–36)
MCV: 88 fL (ref 80–100)
MPV: 7.1 fL (ref 6.0–10.0)
Monocytes Absolute: 0.7 10*3/uL (ref 0.1–1.7)
Monocytes: 6 % (ref 3.0–15.0)
Neutrophils %: 68.2 % (ref 42.0–78.0)
Neutrophils Absolute: 7.5 10*3/uL (ref 1.7–8.6)
PLT CT: 295 10*3/uL (ref 130–440)
RBC: 3.57 10*6/uL — ABNORMAL LOW (ref 3.80–5.00)
RDW: 13.9 % (ref 11.0–14.0)
WBC: 10.9 10*3/uL (ref 4.0–11.0)

## 2021-09-13 LAB — FIBRINOGEN: Fibrinogen: 450 mg/dL (ref 181–469)

## 2021-09-13 LAB — PT AND APTT
PT INR: 1 (ref 0.9–1.1)
PT: 10 s (ref 9.4–11.5)
aPTT: 28.4 s (ref 24.0–34.0)

## 2021-09-13 LAB — TYPE AND SCREEN
AB Screen: NEGATIVE
ABO Rh: O POS

## 2021-09-13 LAB — VH FETAL CELL STAIN: Kleihauer Betke: NEGATIVE

## 2021-09-13 NOTE — UM Notes (Addendum)
[facility] Utilization Management Review Sheet    Facility :  [facility]    NAME: Christine Andersen  MR#: [id number]    CSN#: [id number]    ROOM: 2709/2709-A AGE: 36 y.o.    Date of Birth: 15-Aug-1985    ADMIT DATE AND TIME: 09/07/2021  1:07 PM      PATIENT CLASS:  Inpatient    ATTENDING PHYSICIAN: Betsy Pries, MD  PAYOR:Payor: CIGNA / Plan: CIGNA POS / Product Type: COMMERCIAL /     Subscriber ID:  [id number]     AUTH #: [id number] changed to [id number]    DIAGNOSIS:  Principal Problem:    Vaginal bleeding        HISTORY:   Past Medical History:   Diagnosis Date    ADHD (attention deficit hyperactivity disorder)     PCOS (polycystic ovarian syndrome)     Placenta previa     Seizures        DATE OF REVIEW: 09/13/2021    VITALS: BP 129/61   Pulse (!) 113   Temp 98.4 F (36.9 C) (Oral)   Resp 16   Ht 1.651 m (5\' 5" )   Wt 115.7 kg (255 lb)   LMP 08/05/2020   SpO2 96%   BMI 42.43 kg/m     Active Hospital Problems    Diagnosis    Vaginal bleeding     [id number]:  G2P0 @ 27 wks 5 days gestation admitted with vaginal bleeding. With known placenta previa.  Moderate amount of  bright red bleeding early this am that soaked through her pad and pants with passage of golf ball sized clot.  She continues to have light brown discharge.  No contractions.  Active fetal movement.  Fetal NST reactive.  FHR 150.   Labs:  WBC 10.9, H/H 10.8/31.2, PLT 295, RBC 3.57, PT 10.0, PT INR 1.0, APTT 28.4, fibrinogen 450.  L&D, BR with BRP, continuous EFM, Toco, Colace po qd, Pepcid po BID, Ferrous sulfate po qd, PNV po qd, IV to saline lock, Tylenol po prn, Zofran prn, Nifedipine po prn, Type and screen q 72 hrs, MFM consulting.    MCG:  MG-OBD    Rush Barer. Sidnie Swalley, Airline pilot [phone]  Fax:  [phone]  [email or web address]

## 2021-09-13 NOTE — Progress Notes (Signed)
OBSTETRICS - ANTEPARTUM PROGRESS NOTE    09/13/2021 10:53 AM        Christine Andersen is a 36 y.o.G2P0010 with Estimated Date of Delivery: 12/08/21 and at [redacted]w[redacted]d gestation who was admitted to the antepartum service for second vaginal bleed in setting of placenta previa. Patient notes episode of bright red bleeding which soaked her through to her pants with golf ball size clot last night.  She noticed a small amount of additional bright red bleeding after using the bathroom at 4am.  She reports continued light brown discharge since. Patient denies LOF and UCs. Additionally, she reports good FM.     Physical Exam:  Patient Vitals for the past 24 hrs:   BP Temp Temp src Pulse Resp   09/13/21 0900 129/61 98.4 F (36.9 C) Oral (!) 113 --   09/13/21 0220 118/56 98.9 F (37.2 C) Oral (!) 109 16   09/12/21 2006 127/86 98.4 F (36.9 C) Temporal (!) 107 17   09/12/21 1519 147/84 98.1 F (36.7 C) Temporal (!) 111 19   09/12/21 1141 143/80 97.3 F (36.3 C) Temporal (!) 102 17     FHTs: 150, Variability: moderate  Pattern: accelerations  UCs: none on external tocometer  Cx: deferred    General: No apparent distress; appears comfortable.  Abdomen: Gravid, NT with appropriate FH.  Back: No CVAT, atraumatic, no point tenderness.  Extremities: No cyanosis or erythema. Mild bilateral LE edema. Symmetric. No calf pain. 2+ DTRs.    Prenatal Care Labs:  Lab Results   Component Value Date    ABORH O Positive 09/13/2021    HEPBSAG Negative 04/20/2021    RPR Nonreactive 04/20/2021    RUBELLAABIGG immunue 04/20/2021       Recent Labs:   Results       Procedure Component Value Units Date/Time    Fetal Cell Stain [[id number]] Collected: 09/13/21 0304    Specimen: Blood Updated: 09/13/21 0607     Kleihauer Betke Negative    PT/APTT [[id number]] Collected: 09/13/21 0304    Specimen: Blood Updated: 09/13/21 0358     PT 10.0 sec      PT INR 1.0     aPTT 28.4 sec     Fibrinogen [[id number]] Collected: 09/13/21 0304    Specimen: Blood Updated:  09/13/21 0358     Fibrinogen 450 mg/dL     Type and Screen [[id number]] Collected: 09/13/21 0304    Specimen: Blood Updated: 09/13/21 0353     ABO Rh O Positive     AB Screen NEGATIVE    CBC and differential [[id number]]  (Abnormal) Collected: 09/13/21 0304    Specimen: Blood Updated: 09/13/21 0332     WBC 10.9 K/cmm      RBC 3.57 M/cmm      Hemoglobin 10.8 gm/dL      Hematocrit 24.4 %      MCV 88 fL      MCH 30 pg      MCHC 35 gm/dL      RDW 01.0 %      PLT CT 295 K/cmm      MPV 7.1 fL      Neutrophils % 68.2 %      Lymphocytes 24.3 %      Monocytes 6.0 %      Eosinophils % 1.0 %      Basophils % 0.4 %      Neutrophils Absolute 7.5 K/cmm      Lymphocytes Absolute 2.7 K/cmm  Monocytes Absolute 0.7 K/cmm      Eosinophils Absolute 0.1 K/cmm      Basophils Absolute 0.0 K/cmm               Assessment and Plan:  1. IUP in 36 y.o.G2P0010 at [redacted]w[redacted]d gestation.  2. Admitted for vaginal bleeding in setting of complete placenta previa  3. No evidence of acute maternal or fetal compromise.  4. Reactive NST; reassuring fetal surveillance.  cEFM d/t recent bleed  5. Continue current care inpatient  6. Discussed care plan with Dr. Talbot Grumbling, MD

## 2021-09-13 NOTE — Plan of Care (Signed)
Patient having scant brown discharge.  Vital signs stable.  Patient to have continuous monitoring. Category 1 fetal strip.  No contractions noted.  Tolerating po intake well.    Problem: Antenatal Care Plan  Goal: Maintain pregnancy to a viable gestational age and as close to term as medically indicated  Outcome: Progressing  Flowsheets (Taken 09/07/2021 1804 by Army Fossa, RN)  Maintain pregnancy to a viable gestational age and as close to term as medically indicated:   Monitor and assess vital signs   Monitor maternal weight   Initiate diet as ordered   Activity as ordered/assist with ADLs as needed   Encourage frequent position changes (left lateral side lying, Trendelenburg, other)   Encourage/provide adequate hydration   Assess for vaginal leaking of fluid   Monitor uterine activity   Encourage patient to empty bladder at regular intervals   Assess for vaginal bleeding   Monitor fetal movement   TOCO as ordered   Fetal surveillance as ordered. Notify LIP of abnormal results.   VTE Prevention: Administer anticoagulant(s) and/or apply anti-embolism stockings/devices as ordered   Monitor/assess lab values and report abnormal values   Review ultrasound results.  Notify LIP of abnormal results.   Verify knowledge regarding diagnosis and medication side effects   Provide emotional support   Integrate cultural needs   Perinatal consults     Problem: Bleeding in Pregnancy/Abnormal Placentation  Goal: Maintain hemodynamic stability of mother and fetus  Outcome: Progressing  Flowsheets (Taken 09/13/2021 1519)  Maintain hemodynamic stability of mother and fetus:   Monitor bleeding, pad count and/or weigh pad if indicated   Maintain availability of Type and Screen as ordered

## 2021-09-13 NOTE — Progress Notes (Signed)
Patient called this RN into room. Moderate bleeding noted in pad and in toilet with medium sized clot. MD notified, patient transferred to labor and delivery for closer monitoring.

## 2021-09-14 NOTE — Plan of Care (Signed)
Problem: Antenatal Care Plan  Goal: Maintain pregnancy to a viable gestational age and as close to term as medically indicated  Outcome: Progressing     Problem: Bleeding in Pregnancy/Abnormal Placentation  Goal: Maintain hemodynamic stability of mother and fetus  Outcome: Progressing

## 2021-09-15 NOTE — Progress Notes (Signed)
Pt with small amount of bright red VB (5th episode overall) again at 19:00. Min on the pad. Denies ctxns/LOF, reports GFM  AVSS  FHT : Cat1, 150's reactive, +accels, no decels  Toco : quiet  Time on the monitor (1 hr)  Precautions reviewed with patient  D/w Dr Wandra Scot

## 2021-09-15 NOTE — Progress Notes (Signed)
OBSTETRICS - ANTEPARTUM PROGRESS NOTE    09/15/2021 11:42 AM        Christine Andersen is a 36 y.o.G2P0010 with Estimated Date of Delivery: 12/08/21 and at [redacted]w[redacted]d gestation who was admitted to the antepartum service for third vaginal bleed in setting of placenta previa. She had her 4th bleed 2 nights ago. Reports no further VB since then. Scant mucousy brown/beige d/c this am.  Patient denies LOF and UCs. Additionally, she reports good FM.     Physical Exam:  Patient Vitals for the past 24 hrs:   BP Temp Temp src Pulse Resp SpO2 Weight   09/15/21 0919 124/71 97.7 F (36.5 C) Temporal (!) 105 18 -- --   09/14/21 2235 132/73 97.5 F (36.4 C) Temporal (!) 106 17 -- --   09/14/21 1903 125/68 98.1 F (36.7 C) Temporal (!) 108 19 -- --   09/14/21 1548 -- -- -- -- -- -- 113.3 kg (249 lb 11.2 oz)   09/14/21 1525 131/75 97.7 F (36.5 C) Temporal (!) 107 16 97 % --   09/14/21 1306 137/75 98 F (36.7 C) Oral (!) 105 16 -- --     FHTs: 150, Variability: moderate  Pattern: accelerations  UCs: none on external tocometer  Cx: deferred    General: No apparent distress; appears comfortable.  Abdomen: Gravid, NT with appropriate FH.  Back: No CVAT, atraumatic, no point tenderness.  Extremities: No cyanosis or erythema. Mild bilateral LE edema. Symmetric. No calf pain. 2+ DTRs.    Prenatal Care Labs:  Lab Results   Component Value Date    ABORH O Positive 09/13/2021    HEPBSAG Negative 04/20/2021    RPR Nonreactive 04/20/2021    RUBELLAABIGG immunue 04/20/2021       Assessment and Plan:  1. IUP in 36 y.o.G2P0010 at [redacted]w[redacted]d gestation.  2. Admitted for vaginal bleeding in setting of complete placenta previa  3. No evidence of acute maternal or fetal compromise.  4. Reactive NST; reassuring fetal surveillance.  cEFM d/t recent bleed  5. Continue current care inpatient    Cresenciano Lick, MD

## 2021-09-16 LAB — TYPE AND SCREEN
AB Screen: NEGATIVE
ABO Rh: O POS

## 2021-09-16 NOTE — Plan of Care (Signed)
Problem: Antenatal Care Plan  Goal: Maintain pregnancy to a viable gestational age and as close to term as medically indicated  Outcome: Progressing  Flowsheets (Taken 09/16/2021 2200)  Maintain pregnancy to a viable gestational age and as close to term as medically indicated:   Monitor and assess vital signs   Monitor maternal weight   Monitor intake and output.  Notify LIP if urine output is less than 30 mL/hour.   Initiate diet as ordered   Activity as ordered/assist with ADLs as needed   Encourage frequent position changes (left lateral side lying, Trendelenburg, other)   Encourage/provide adequate hydration   Encourage patient to empty bladder at regular intervals   Monitor uterine activity   Provide Fetal Kick Count Sheet   Monitor fetal movement   Assess for vaginal bleeding   Assess for vaginal leaking of fluid   Fetal surveillance as ordered. Notify LIP of abnormal results.   TOCO as ordered   Monitor/assess lab values and report abnormal values   Review ultrasound results.  Notify LIP of abnormal results.   Provide emotional support   Verify knowledge regarding diagnosis and medication side effects   Integrate cultural needs   Perinatal consults

## 2021-09-16 NOTE — Progress Notes (Signed)
OBSTETRICS - ANTEPARTUM PROGRESS NOTE    09/16/2021 9:59 AM        Christine Andersen is a 36 y.o.G2P0010 with Estimated Date of Delivery: 12/08/21 and at [redacted]w[redacted]d gestation who was admitted to the antepartum service for third vaginal bleed in setting of placenta previa. She had small VB (fifth episode) last evening. Reports no further VB since then. Scant mucousy brown/beige d/c this am.  Patient denies LOF and UCs. Additionally, she reports good FM.     Physical Exam:  Patient Vitals for the past 24 hrs:   BP Temp Temp src Pulse Resp   09/16/21 0803 122/69 97.7 F (36.5 C) Temporal (!) 108 18   09/15/21 2020 117/71 98.2 F (36.8 C) Temporal (!) 105 16   09/15/21 1635 127/66 97.5 F (36.4 C) Temporal (!) 111 18   09/15/21 1249 -- -- Temporal -- --   09/15/21 1249 138/79 97.5 F (36.4 C) Temporal (!) 118 18     FHTs: 150, Variability: moderate  Pattern: accelerations  UCs: none on external tocometer  Cx: deferred    General: No apparent distress; appears comfortable.  Abdomen: Gravid, NT with appropriate FH.  Back: No CVAT, atraumatic, no point tenderness.  Extremities: No cyanosis or erythema. Mild bilateral LE edema. Symmetric. No calf pain. 2+ DTRs.    Prenatal Care Labs:  Lab Results   Component Value Date    ABORH O Positive 09/13/2021    HEPBSAG Negative 04/20/2021    RPR Nonreactive 04/20/2021    RUBELLAABIGG immunue 04/20/2021       Assessment and Plan:  1. IUP in 36 y.o.G2P0010 at [redacted]w[redacted]d gestation.  2. Admitted for vaginal bleeding in setting of complete placenta previa  3. No evidence of acute maternal or fetal compromise.  4. Reactive NST; reassuring fetal surveillance.  cEFM d/t recent bleed  5. Continue current care inpatient    Cresenciano Lick, MD

## 2021-09-17 NOTE — Plan of Care (Signed)
Problem: Antenatal Care Plan  Goal: Maintain pregnancy to a viable gestational age and as close to term as medically indicated  Outcome: Progressing     Problem: Bleeding in Pregnancy/Abnormal Placentation  Goal: Maintain hemodynamic stability of mother and fetus  Outcome: Progressing

## 2021-09-17 NOTE — Progress Notes (Addendum)
OBSTETRICS - ANTEPARTUM PROGRESS NOTE    09/17/2021 11:25 AM        Christine Andersen is a 36 y.o.G2P0010 with Estimated Date of Delivery: 12/08/21 and at [redacted]w[redacted]d gestation who was admitted to the antepartum service for third vaginal bleed in setting of placenta previa. She had small VB (fifth episode) lon 4/14. Reports no further VB since then. Scant mucousy brown/beige d/c this am.  Patient denies LOF and UCs. Additionally, she reports good FM.     Physical Exam:  Patient Vitals for the past 24 hrs:   BP Temp Temp src Pulse Resp   09/17/21 0853 96/58 97.5 F (36.4 C) Temporal (!) 109 18   09/16/21 1912 117/68 97.5 F (36.4 C) Temporal 100 17   09/16/21 1542 119/67 97.9 F (36.6 C) Temporal (!) 109 17   09/16/21 1137 123/79 98.2 F (36.8 C) Temporal (!) 119 17     FHTs: 150, Variability: moderate  Pattern: accelerations  UCs: none on external tocometer  Cx: deferred    General: No apparent distress; appears comfortable.  Abdomen: Gravid, NT with appropriate FH.  Back: No CVAT, atraumatic, no point tenderness.  Extremities: No cyanosis or erythema. Mild bilateral LE edema. Symmetric. No calf pain. 2+ DTRs.    Prenatal Care Labs:  Lab Results   Component Value Date    ABORH O Positive 09/16/2021    HEPBSAG Negative 04/20/2021    RPR Nonreactive 04/20/2021    RUBELLAABIGG immunue 04/20/2021       Assessment and Plan:  1. IUP in 36 y.o.G2P0010 at [redacted]w[redacted]d gestation.  2. Admitted for vaginal bleeding in setting of complete placenta previa  3. No evidence of acute maternal or fetal compromise.  4. Reactive NST; reassuring fetal surveillance.   5. Strongly encouraged pt to do SCDs. She has been refusing them so far. Discussed rationale.  6. Continue current care inpatient    Cresenciano Lick, MD

## 2021-09-18 NOTE — UM Notes (Signed)
[facility] Utilization Management Review Sheet    Facility :  [facility]    NAME: Christine Andersen  MR#: [id number]    CSN#: [id number]    ROOM: 226/226-A AGE: 36 y.o.    Date of Birth: 06-Jun-1985    ADMIT DATE AND TIME: 09/07/2021  1:07 PM      PATIENT CLASS: Inpatient     ATTENDING PHYSICIAN: Betsy Pries, MD  PAYOR:Payor: CIGNA / Plan: CIGNA POS / Product Type: COMMERCIAL /   Subscriber ID:  [id number]       AUTH #:   [id number]    DIAGNOSIS:  Principal Problem:    Vaginal bleeding        HISTORY:   Past Medical History:   Diagnosis Date    ADHD (attention deficit hyperactivity disorder)     PCOS (polycystic ovarian syndrome)     Placenta previa     Seizures        DATE OF REVIEW: 09/18/2021    VITALS: BP 104/58   Pulse (!) 106   Temp 97.9 F (36.6 C) (Temporal)   Resp 19   Ht 1.651 m (5\' 5" )   Wt 113.3 kg (249 lb 11.2 oz)   LMP 08/05/2020   SpO2 98%   BMI 41.55 kg/m     Active Hospital Problems    Diagnosis    Vaginal bleeding     [id number]:  G2P0 @ 28 wks 1 day gestation admitted with vaginal bleeding with known placenta previa.  Small bleed in pm 4/14, fifth episode.  Today she is having brown/beige vaginal discharge.  Active fetal movement.  Fetal NST reactive.  FHR 150.  VS:  122/69, 97.7, 108, 18.  PP Unit, Fetal NST BID and prn, Type and screen q 72 hrs.    Plan: Deliver for maternal/Fetal compromisse.    MCG:  MG-OBD    Rush Barer. Takuya Lariccia, Airline pilot [phone]  Fax:  [phone]  Harriett [email or web address]

## 2021-09-18 NOTE — Progress Notes (Signed)
OBSTETRICS - ANTEPARTUM PROGRESS NOTE    09/18/2021 7:51 AM        Christine Andersen is a 36 y.o.G2P0010 with Estimated Date of Delivery: 12/08/21 and at [redacted]w[redacted]d gestation who was admitted to the antepartum service for vaginal bleeding in setting of placenta previa. Patient notes no episodes of bright red bleeding since fifth episode on 4/14.  She denies brown discharge, leaking of fluid, or contractions this morning.  Additionally, she reports good FM.     Physical Exam:  Patient Vitals for the past 24 hrs:   BP Temp Temp src Pulse Resp   09/17/21 1857 119/74 98.1 F (36.7 C) Temporal (!) 114 17   09/17/21 1730 124/70 97.9 F (36.6 C) Temporal (!) 107 20   09/17/21 1239 109/55 98.1 F (36.7 C) Temporal (!) 104 18   09/17/21 0853 96/58 97.5 F (36.4 C) Temporal (!) 109 18     FHTs: 145, Variability: moderate  Pattern: accelerations  UCs: none on external tocometer  Cx: deferred    General: No apparent distress; appears comfortable.  Abdomen: Gravid, NT with appropriate FH.  Back: No CVAT, atraumatic, no point tenderness.  Extremities: No cyanosis or erythema. Mild bilateral LE edema. Symmetric. No calf pain. 2+ DTRs.    Prenatal Care Labs:  Lab Results   Component Value Date    ABORH O Positive 09/16/2021    HEPBSAG Negative 04/20/2021    RPR Nonreactive 04/20/2021    RUBELLAABIGG immunue 04/20/2021       Recent Labs:   Recent Labs   Lab 09/13/21  0304   WBC 10.9   Hemoglobin 10.8*   PLT CT 295       Assessment and Plan:  1. IUP in 36 y.o.G2P0010 at [redacted]w[redacted]d gestation.  2. Admitted for vaginal bleeding in setting of complete placenta previa  3. No evidence of acute maternal or fetal compromise.  4. Reactive NST; reassuring fetal surveillance.  NST BID and PRN  5. Continue current care inpatient    Truddie Crumble, MD

## 2021-09-18 NOTE — Progress Notes (Signed)
Patient up to bathroom, passed nickel sized clot in toilet. Patient put on fetal monitor. Dr. Humphrey's notified, will continue to monitor infant and bleeding per MD.

## 2021-09-19 LAB — TYPE AND SCREEN
AB Screen: NEGATIVE
ABO Rh: O POS

## 2021-09-19 LAB — CBC AND DIFFERENTIAL
Basophils %: 0.6 % (ref 0.0–3.0)
Basophils Absolute: 0.1 10*3/uL (ref 0.0–0.3)
Eosinophils %: 1.2 % (ref 0.0–7.0)
Eosinophils Absolute: 0.1 10*3/uL (ref 0.0–0.8)
Hematocrit: 30.6 % — ABNORMAL LOW (ref 36.0–48.0)
Hemoglobin: 10.5 gm/dL — ABNORMAL LOW (ref 12.0–16.0)
Lymphocytes Absolute: 2.4 10*3/uL (ref 0.6–5.1)
Lymphocytes: 25.2 % (ref 15.0–46.0)
MCH: 30 pg (ref 28–35)
MCHC: 34 gm/dL (ref 32–36)
MCV: 86 fL (ref 80–100)
MPV: 6.7 fL (ref 6.0–10.0)
Monocytes Absolute: 0.5 10*3/uL (ref 0.1–1.7)
Monocytes: 5.4 % (ref 3.0–15.0)
Neutrophils %: 67.6 % (ref 42.0–78.0)
Neutrophils Absolute: 6.4 10*3/uL (ref 1.7–8.6)
PLT CT: 291 10*3/uL (ref 130–440)
RBC: 3.54 10*6/uL — ABNORMAL LOW (ref 3.80–5.00)
RDW: 14 % (ref 11.0–14.0)
WBC: 9.4 10*3/uL (ref 4.0–11.0)

## 2021-09-19 LAB — PT AND APTT
PT INR: 1 (ref 0.9–1.1)
PT: 10.3 s (ref 9.7–11.8)
aPTT: 30.1 s (ref 24.0–34.0)

## 2021-09-19 LAB — FIBRINOGEN: Fibrinogen: 417 mg/dL (ref 181–469)

## 2021-09-19 NOTE — UM Notes (Signed)
[facility] Utilization Management Review Sheet    Facility :  [facility]    NAME: Christine Andersen  MR#: [id number]    CSN#: [id number]    ROOM: 226/226-A AGE: 36 y.o.    Date of Birth: [birth year]    ADMIT DATE AND TIME: 09/07/2021  1:07 PM      PATIENT CLASS: Inpatient     ATTENDING PHYSICIAN: Betsy Pries, MD  PAYOR:Payor: CIGNA / Plan: CIGNA POS / Product Type: COMMERCIAL /   Subscriber ID:  [id number]       AUTH #: [id number]    DIAGNOSIS:  Principal Problem:    Vaginal bleeding        HISTORY:   Past Medical History:   Diagnosis Date    ADHD (attention deficit hyperactivity disorder)     PCOS (polycystic ovarian syndrome)     Placenta previa     Seizures      DATE OF REVIEW: 09/19/2021    VITALS: BP 128/70   Pulse (!) 103   Temp 97.3 F (36.3 C) (Temporal)   Resp 16   Ht 1.651 m (5\' 5" )   Wt 113.3 kg (249 lb 11.2 oz)   LMP 08/05/2020   SpO2 98%   BMI 41.55 kg/m     Active Hospital Problems    Diagnosis    Vaginal bleeding     [id number]:  G2P0 @ 28 wks 4 days gestation with antepartum bleeding with known placenta previa.  She had a bright red bleeding episode yesterday-sixth episode.  Active fetal movement.  FHR 145.  Fetal NST reactive.  No contractions.   PP Unit, Fetal NST BID, IV to saline lock, Colace po qd, Pepcid po q 12 hrs, Ferrous sulfate po qd, PNV po qd, Zofran IV q 8 hrs prn x 2, Type and screen q 72 hrs.      MCG:  MG-OBD    Rush Barer. Velma Hanna, Airline pilot [phone]  Fax:  [phone]  [email or web address]

## 2021-09-19 NOTE — Plan of Care (Addendum)
Both pt and pts mom spoke to this RN about how they were concerned about her feeling depressed. Spoke to the pt about the options that are available.     Contacted Dr. Revonda Standard to ask for pt to have access to the balcony off the second floor off of mother baby and possible counseling. Per MD pt is okay to take a trip to the balcony with her family as long as she has been bleed free for 24 hours. Any further distance and a staff member must accompany. MD to look into counseling for pt.     Problem: Antenatal Care Plan  Goal: Maintain pregnancy to a viable gestational age and as close to term as medically indicated  Outcome: Progressing     Problem: Bleeding in Pregnancy/Abnormal Placentation  Goal: Maintain hemodynamic stability of mother and fetus  Outcome: Progressing

## 2021-09-19 NOTE — Progress Notes (Signed)
OBSTETRICS - ANTEPARTUM PROGRESS NOTE    09/19/2021 8:57 AM        Christine Andersen is a 36 y.o.G2P0010 with Estimated Date of Delivery: 12/08/21 and at [redacted]w[redacted]d gestation who was admitted to the antepartum service for vaginal bleeding in setting of placenta previa. Patient had episode of bright red bleeding yesterday (sixth episode).  She denies brown discharge, leaking of fluid, or contractions this morning.  Additionally, she reports good FM.   Very tearful this am - says is tired of staying in the hospital    Physical Exam:  Patient Vitals for the past 24 hrs:   BP Temp Temp src Pulse Resp SpO2   09/18/21 2348 106/58 97.7 F (36.5 C) Temporal 94 16 --   09/18/21 1951 116/64 98.4 F (36.9 C) Temporal (!) 107 16 --   09/18/21 1539 104/58 97.9 F (36.6 C) Temporal (!) 106 19 --   09/18/21 1119 127/80 97.5 F (36.4 C) Temporal (!) 124 16 98 %   09/18/21 0911 105/61 98.1 F (36.7 C) Temporal (!) 106 17 --     FHTs: 145, Variability: moderate  Pattern: accelerations  UCs: none on external tocometer  Cx: deferred    General: No apparent distress; appears comfortable.  Abdomen: Gravid, NT with appropriate FH.  Back: No CVAT, atraumatic, no point tenderness.  Extremities: No cyanosis or erythema. Mild bilateral LE edema. Symmetric. No calf pain. 2+ DTRs.    Prenatal Care Labs:  Lab Results   Component Value Date    ABORH O Positive 09/16/2021    HEPBSAG Negative 04/20/2021    RPR Nonreactive 04/20/2021    RUBELLAABIGG immunue 04/20/2021       Recent Labs:   Recent Labs   Lab 09/13/21  0304   WBC 10.9   Hemoglobin 10.8*   PLT CT 295       Assessment and Plan:  1. IUP in 36 y.o.G2P0010 at [redacted]w[redacted]d gestation.  2. Admitted for vaginal bleeding in setting of complete placenta previa  3. No evidence of acute maternal or fetal compromise.  4. Reactive NST; reassuring fetal surveillance.  NST BID and PRN  5. Rationale for inpatient care reviewed . She is upset but understanding  6. Continue current care inpatient    Cresenciano Lick, MD

## 2021-09-20 LAB — VH FETAL CELL STAIN: Kleihauer Betke: NEGATIVE

## 2021-09-20 MED ORDER — DOCUSATE SODIUM 100 MG PO CAPS
100.0000 mg | ORAL_CAPSULE | Freq: Two times a day (BID) | ORAL | Status: DC
Start: 2021-09-20 — End: 2021-09-23
  Administered 2021-09-20 – 2021-09-21 (×4): 100 mg via ORAL
  Filled 2021-09-20 (×4): qty 1

## 2021-09-20 NOTE — Progress Notes (Signed)
No further bleeding, some brownish discharge.   Fetus active, fetal monitoring reassuring.  OK for transfer back to [facility].

## 2021-09-20 NOTE — UM Notes (Signed)
[facility] Utilization Management Review Sheet    Facility :  [facility]    NAME: Christine Andersen  MR#: [id number]    CSN#: [id number]    ROOM: 2706/2706-A AGE: 36 y.o.    Date of Birth: 05-14-1986    ADMIT DATE AND TIME: 09/07/2021  1:07 PM      PATIENT CLASS: Inpatient     ATTENDING PHYSICIAN: Betsy Pries, MD  PAYOR:Payor: [employer] / Plan: CIG[employer] POS / Product Type: COMMERCIAL /     Subscriber ID:  [id number]     AUTH #: [id number]    DIAGNOSIS: Principal Problem:    Vaginal bleeding        HISTORY:   Past Medical History:   Diagnosis Date    ADHD (attention deficit hyperactivity disorder)     PCOS (polycystic ovarian syndrome)     Placenta previa     Seizures        DATE OF REVIEW: 09/20/2021    VITALS: BP 110/55   Pulse (!) 103   Temp 98.1 F (36.7 C) (Oral)   Resp 20   Ht 1.651 m (5\' 5" )   Wt 113.3 kg (249 lb 11.2 oz)   LMP 08/05/2020   SpO2 98%   BMI 41.55 kg/m     Active Hospital Problems    Diagnosis    Vaginal bleeding     [id number]:  G2P0 @ 28 wks 5 days gestation admitted with vaginal with known placenta previa.  Patient had an episode of small amount of bright red blood last evening (7th). She is having brown discharge this am.  Active fetal movement.  FHR 150.  Fetal NST reactive. No contractions.   Labs done 09/19/21:  H/H 10.5/30.6, RBC 3.54  Transferred to L&D 09/19/21 in pm.  Continuous EFM, Toco, Colace po BID, Pepcid PO BID, Ferrous Sulfate PO BID, PNV PO qd, IV to saline lock, Tylenol po prn, Nifedipine PO prn, Zofran IV prn.  Plan:  Delivery for maternal or fetal compromise.     MG-OBD    Rush Barer Quisha Mabie, Airline pilot [phone]  Fax:  [phone]  [email or web address]

## 2021-09-20 NOTE — Progress Notes (Signed)
OBSTETRICS - ANTEPARTUM PROGRESS NOTE    09/20/2021 7:48 AM        Christine Andersen is a 36 y.o.G2P0010 with Estimated Date of Delivery: 12/08/21 and at [redacted]w[redacted]d gestation who was admitted to the antepartum service for vaginal bleeding with diagnosis of placenta previa. Patient notes episode last night of vaginal bleeding (seventh episode) after bowel movement, was red blood, she reports not large amount. Some brownish discharge this am. Patient denies decreased fetal movement and uterine contractions. Patient reports good fetal movement.    Physical Exam:  Patient Vitals for the past 24 hrs:   BP Temp Temp src Pulse Resp SpO2   09/20/21 0737 113/57 98.2 F (36.8 C) Oral 96 18 96 %   09/19/21 2207 121/57 98.2 F (36.8 C) Oral 96 20 --   09/19/21 2041 113/63 98.1 F (36.7 C) Temporal 93 17 --   09/19/21 1557 123/78 97.9 F (36.6 C) Temporal (!) 104 17 --   09/19/21 1107 128/70 97.3 F (36.3 C) Temporal (!) 103 16 --   09/19/21 1044 108/67 97.7 F (36.5 C) Temporal (!) 106 17 --     FHTs: 150, reassuring fetal surveillance  UCs: none on external tocometer, none perceived by patient  Cx: (Per contraindicated at present.    General: No apparent distress; appears comfortable.  Abdomen: Gravid, NT with appropriate FH.  Back: No CVAT, atraumatic, no point tenderness.  Extremities: No cyanosis or erythema. Symmetric. No calf pain.     Prenatal Care Labs:  Lab Results   Component Value Date    ABORH O Positive 09/19/2021    HEPBSAG Negative 04/20/2021    RPR Nonreactive 04/20/2021    RUBELLAABIGG immunue 04/20/2021       Recent Labs:   Recent Labs   Lab 09/19/21  2215   WBC 9.4   Hemoglobin 10.5*   PLT CT 291     @labs72 @    Assessment and Plan:  1. IUP in 36 y.o.G2P0010 at 106w5d gestation.  2. Admitted for vaginal bleeding with diagnosis of placenta previa; bleeding last pm noted after BM  3. No evidence of acute maternal or fetal compromise.  4. Reassuring fetal surveillance.  5. Continue current care    [redacted]w[redacted]d, MD

## 2021-09-21 MED ORDER — FERROUS SULFATE 325 (65 FE) MG PO TABS
325.0000 mg | ORAL_TABLET | Freq: Two times a day (BID) | ORAL | Status: DC
Start: 2021-09-21 — End: 2021-09-23
  Administered 2021-09-21: 325 mg via ORAL

## 2021-09-21 MED ORDER — CALCIUM CARBONATE ANTACID 500 MG PO CHEW
1000.0000 mg | CHEWABLE_TABLET | Freq: Four times a day (QID) | ORAL | Status: DC | PRN
Start: 2021-09-21 — End: 2021-09-23
  Administered 2021-09-21: 1000 mg via ORAL
  Filled 2021-09-21: qty 2

## 2021-09-21 NOTE — Progress Notes (Signed)
OBSTETRICS - ANTEPARTUM PROGRESS NOTE    09/21/2021 8:36 AM        Christine Andersen is a 36 y.o.G2P0010 with Estimated Date of Delivery: 12/08/21 and at [redacted]w[redacted]d gestation who was admitted to the antepartum service for vaginal bleeding with diagnosis of placenta previa. Some brownish discharge this am. Patient denies decreased fetal movement and uterine contractions. Patient reports good fetal movement.    Physical Exam:  Patient Vitals for the past 24 hrs:   BP Temp Temp src Pulse Resp SpO2   09/21/21 0832 124/64 97.7 F (36.5 C) Temporal (!) 110 16 95 %   09/20/21 1929 123/71 97.5 F (36.4 C) Temporal (!) 114 18 99 %   09/20/21 1648 116/55 -- -- (!) 105 18 --   09/20/21 1243 110/55 98.1 F (36.7 C) Oral (!) 103 20 98 %     FHTs: 150, reassuring fetal surveillance  UCs: none on external tocometer, none perceived by patient  Cx: (Per contraindicated at present.    General: No apparent distress; appears comfortable.  Abdomen: Gravid, NT with appropriate FH.  Back: No CVAT, atraumatic, no point tenderness.  Extremities: No cyanosis or erythema. Symmetric. No calf pain.     Prenatal Care Labs:  Lab Results   Component Value Date    ABORH O Positive 09/19/2021    HEPBSAG Negative 04/20/2021    RPR Nonreactive 04/20/2021    RUBELLAABIGG immunue 04/20/2021       Recent Labs:   Recent Labs   Lab 09/19/21  2215   WBC 9.4   Hemoglobin 10.5*   PLT CT 291     @labs72 @    Assessment and Plan:  1. IUP in 36 y.o.G2P0010 at [redacted]w[redacted]d gestation.  2. Admitted for vaginal bleeding with diagnosis of placenta previa; Last bleeding 4/18  3. No evidence of acute maternal or fetal compromise.  4. Reassuring fetal surveillance.  5. Continue current care    Cresenciano Lick, MD

## 2021-09-21 NOTE — Progress Notes (Signed)
Nutrition Therapy  Nutrition Screen    Patient Information:     Name:Christine Andersen   Age: 36 y.o.   Sex: female     MRN: [id number]    Reason For Screen:     Length of Stay      Nutrition Assessment:     Admitted w/ vaginal bleeding w/ hx of placenta previa @ [redacted]w[redacted]d, now at [redacted]w[redacted]d. Hx including PCOS. No PO intake to trend at this time, noted 100% of recorded meals per chart.  Reported good appetite and intake without changes compared to pta. Denied any GI issues at this time. No indication for ONS at this time.    No nutrition diagnosis at this time      Nutrition Risk Level: Low      No further recommendations at this time.  RD to follow per policy, nutritional status change or MD Consult      Montey Hora, RDN  09/21/2021 10:35 AM

## 2021-09-21 NOTE — Progress Notes (Signed)
L&D staff at bedside, patient taken to labor and delivery for evaluation. Parents of patient at bedside.

## 2021-09-21 NOTE — Progress Notes (Signed)
Pt seen and examined. Minimal VB at this time - says "slowed down now".  Denies ctxns/LOF. Reports GFM  CEFM : Cat 1, no decels, reactive, +accels, moderate variability, AGA  Toco : quiet  Continue current Mx

## 2021-09-21 NOTE — Progress Notes (Signed)
Charge nurse at bedside, awaiting Dr Revonda Standard response.

## 2021-09-21 NOTE — Progress Notes (Addendum)
While attempting to place patient back on monitor for NST, patient felt nauseous. Vomited again and began to have vaginal bleeding. Bright red blood noted in toilet, patient clothing, pad, and floor of restroom. Charge nurse and Dr Revonda Standard notified.

## 2021-09-21 NOTE — Plan of Care (Signed)
Problem: Antenatal Care Plan  Goal: Maintain pregnancy to a viable gestational age and as close to term as medically indicated  Description: Interventions:  1. Monitor and assess vital signs  2. Monitor maternal weight  3. Monitor urine dips  4. Monitor intake and output.  Notify LIP if urine output is less than 30 mL/hour.   5. Initiate diet as ordered  6. Activity as ordered/assist with ADLs as needed  7. Encourage frequent position changes (left lateral side lying, Trendelenburg, other)  8. Encourage/provide adequate hydration  9. Encourage patient to empty bladder at regular intervals  10. Monitor uterine activity  11. Assess for vaginal leaking of fluid  12. Assess for vaginal bleeding  13. Monitor fetal movement  14. Provide Fetal Kick Count Sheet  15. Fetal surveillance as ordered. Notify LIP of abnormal results.  16. TOCO as ordered  17. VTE Prevention: Administer anticoagulant(s) and/or apply anti-embolism stockings/devices as ordered  18. Monitor/assess lab values and report abnormal values  19. Review ultrasound results.  Notify LIP of abnormal results.  20. Provide emotional support  21. Verify knowledge regarding diagnosis and medication side effects  22. Integrate cultural needs  23. Perinatal consults  Outcome: Progressing  Flowsheets (Taken 09/16/2021 2200 by Georgette Dover, RN)  Maintain pregnancy to a viable gestational age and as close to term as medically indicated:   Monitor and assess vital signs   Monitor maternal weight   Monitor intake and output.  Notify LIP if urine output is less than 30 mL/hour.   Initiate diet as ordered   Activity as ordered/assist with ADLs as needed   Encourage frequent position changes (left lateral side lying, Trendelenburg, other)   Encourage/provide adequate hydration   Encourage patient to empty bladder at regular intervals   Monitor uterine activity   Provide Fetal Kick Count Sheet   Monitor fetal movement   Assess for vaginal bleeding   Assess for  vaginal leaking of fluid   Fetal surveillance as ordered. Notify LIP of abnormal results.   TOCO as ordered   Monitor/assess lab values and report abnormal values   Review ultrasound results.  Notify LIP of abnormal results.   Provide emotional support   Verify knowledge regarding diagnosis and medication side effects   Integrate cultural needs   Perinatal consults     Problem: Bleeding in Pregnancy/Abnormal Placentation  Goal: Maintain hemodynamic stability of mother and fetus  Description: Interventions:  1. Monitor bleeding, pad count and/or weigh pad if indicated  2. Maintain availability of Type and Screen as ordered  3. GYN/ONC, Anesthesia and IR consults obtained as ordered  Outcome: Progressing  Flowsheets (Taken 09/16/2021 2200 by Georgette Dover, RN)  Maintain hemodynamic stability of mother and fetus:   Monitor bleeding, pad count and/or weigh pad if indicated   GYN/ONC, Anesthesia and IR consults obtained as ordered   Maintain availability of Type and Screen as ordered

## 2021-09-21 NOTE — Progress Notes (Addendum)
Dr Revonda Standard informed that patient was having a significant amount of bright red bleeding. Patient denied ctx or abd pain. Bleeding continually trickling as patient sat on toilet.Verbal order given to place patient on monitor. Phone disconnected, calling Dr back.

## 2021-09-21 NOTE — Progress Notes (Signed)
CTSE : pt with active VB after she vomited X 2. More in the toilet - so difficult to assess.  On pad- small smear, mild VB noted  AVSS  FHT : positive, reassuring although difficult to assess  Had T+S/Coags/CBC done on 4/18  CEFM and NPO for now  Will follow

## 2021-09-21 NOTE — Progress Notes (Signed)
Patient called this nurse to room for one episode of emesis. Patient unsure if linked to taking Fe this am.Patient given Zofran. Placed back in bed.

## 2021-09-22 ENCOUNTER — Encounter: Payer: Self-pay | Admitting: Obstetrics & Gynecology

## 2021-09-22 ENCOUNTER — Ambulatory Visit: Payer: Commercial Managed Care - POS

## 2021-09-22 DIAGNOSIS — Z3490 Encounter for supervision of normal pregnancy, unspecified, unspecified trimester: Secondary | ICD-10-CM

## 2021-09-22 LAB — CBC AND DIFFERENTIAL
Basophils %: 0.4 % (ref 0.0–3.0)
Basophils Absolute: 0 10*3/uL (ref 0.0–0.3)
Eosinophils %: 1 % (ref 0.0–7.0)
Eosinophils Absolute: 0.1 10*3/uL (ref 0.0–0.8)
Hematocrit: 29.7 % — ABNORMAL LOW (ref 36.0–48.0)
Hemoglobin: 10.1 gm/dL — ABNORMAL LOW (ref 12.0–16.0)
Lymphocytes Absolute: 1.9 10*3/uL (ref 0.6–5.1)
Lymphocytes: 20.3 % (ref 15.0–46.0)
MCH: 29 pg (ref 28–35)
MCHC: 34 gm/dL (ref 32–36)
MCV: 86 fL (ref 80–100)
MPV: 7 fL (ref 6.0–10.0)
Monocytes Absolute: 0.6 10*3/uL (ref 0.1–1.7)
Monocytes: 6.1 % (ref 3.0–15.0)
Neutrophils %: 72.1 % (ref 42.0–78.0)
Neutrophils Absolute: 6.8 10*3/uL (ref 1.7–8.6)
PLT CT: 274 10*3/uL (ref 130–440)
RBC: 3.45 10*6/uL — ABNORMAL LOW (ref 3.80–5.00)
RDW: 14.3 % — ABNORMAL HIGH (ref 11.0–14.0)
WBC: 9.4 10*3/uL (ref 4.0–11.0)

## 2021-09-22 LAB — PT AND APTT
PT INR: 1 (ref 0.9–1.1)
PT: 10.5 s (ref 9.7–11.8)
aPTT: 29.5 s (ref 24.0–34.0)

## 2021-09-22 LAB — TYPE AND SCREEN
AB Screen: NEGATIVE
ABO Rh: O POS

## 2021-09-22 LAB — FIBRINOGEN: Fibrinogen: 473 mg/dL — ABNORMAL HIGH (ref 181–469)

## 2021-09-22 MED ORDER — LACTATED RINGERS IV SOLN
INTRAVENOUS | Status: DC
Start: 2021-09-22 — End: 2021-09-23

## 2021-09-22 MED ORDER — BETAMETHASONE SOD PHOS & ACET 6 (3-3) MG/ML IJ SUSP
12.0000 mg | INTRAMUSCULAR | Status: DC
Start: 2021-09-22 — End: 2021-09-23
  Administered 2021-09-22: 12 mg via INTRAMUSCULAR
  Filled 2021-09-22 (×2): qty 5

## 2021-09-22 NOTE — Clinical Note (Incomplete)
.  wmc

## 2021-09-22 NOTE — Progress Notes (Signed)
Stable, passed a small clot earlier today.  FHTs 140s  Vitals stable.  S/p BMZ #1 of repeat course today.

## 2021-09-22 NOTE — Progress Notes (Signed)
Patient back from MFM. Scant bleeding on pad. IVF LR 500 ml bolus started and will run at 75 ml's an hour after bolus. Pt placed back on fetal monitor. Second round of Betamethasone ordered and first injection given. Dr. Darcel Bayley and Olive Bass RN in room to speak with patient and FOB.

## 2021-09-22 NOTE — Consults (Signed)
MFM INPATIENT CONSULTATION  [facility]   [address], [address]  [place], [place] [address]  [phone]      Date Time: 09/22/2021  11:19 AM  Patient Name: Christine Andersen  Requesting Physician: Tish Frederickson, DO  [address]  [place],  [place] [address]    Reason for Consultation:   Placenta Previa with multiple episodes of vaginal bleeding     History:   Christine Andersen  is a 36 y.o. y.o. female  G2P0010 at [redacted]w[redacted]d admitted for placenta previa with multiple episodes of bleeding. This is her second admission for bleeding- she was admitted on 09/07/21. She was admitted previously on 09/02/21 and received steroids > 2 weeks ago. She continues to have frequent bleeds and passed a larger clot this morning. She has not had cramping or leaking fluid and appreciates good fetal movement. Fetal monitoring has been reactive. She says she had a normal GDM screening.     Pregnancy Risk Factors   Placenta Previa   -Multiple episodes of bleeding > 5   -O pos blood type  -Received steroids   2. 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 BMI 40  5. ADHD  -Stopped Adderall with pregnancy  6. Anxiety and Depression  7. PCOS    Past Medical History:     Past Medical History:   Diagnosis Date    ADHD (attention deficit hyperactivity disorder)     PCOS (polycystic ovarian syndrome)     Placenta previa     Seizures        Past Surgical History:     Past Surgical History:   Procedure Laterality Date    REDUCTION MAMMAPLASTY Bilateral 2009    REVISION OF SCAR Bilateral     breast    TONSILLECTOMY         Family History:   History reviewed. No pertinent family history.    Social History:     Social History     Socioeconomic History    Marital status: Single   Tobacco Use    Smoking status: Former     Packs/day: 0.25     Types: Cigarettes     Quit date: 10/02/2013     Years since quitting: 7.9    Smokeless tobacco: Never   Vaping Use    Vaping status: Never  Used   Substance and Sexual Activity    Alcohol use: Yes     Comment: socially    Drug use: No    Sexual activity: Yes     Partners: Male       Allergies:     Allergies   Allergen Reactions    Keppra [Levetiracetam]      Depression, anger, suicidal thoughts       Medications:     No current facility-administered medications on file prior to encounter.     Current Outpatient Medications on File Prior to Encounter   Medication Sig Dispense Refill    ferrous sulfate 325 (65 FE) MG tablet Take 325 mg by mouth 2 (two) times daily      lamoTRIgine (LAMICTAL) 200 MG tablet Take 1 tablet (200 mg total) by mouth 2 (two) times daily. 60 tablet 0    Magnesium 250 MG Tab Take 1 tablet by mouth Once a day at 6:00am      Prenatal Vit-Fe Fumarate-FA (PRENATAL VITAMINS PO) Take 1 tablet by mouth  Review of Systems:   A comprehensive review of systems was: Negative except what is stated in HPI.     Physical Exam:   BP 116/57   Pulse (!) 102   Temp 98 F (36.7 C)   Resp 16   Ht 5\' 5"  (1.651 m)   Wt 249 lb 11.2 oz (113.3 kg)   LMP 08/05/2020   SpO2 95%   BMI 41.55 kg/m       Intake/Output Summary (Last 24 hours) at 09/22/2021 1119  Last data filed at 09/22/2021 0930  Gross per 24 hour   Intake --   Output 204 ml   Net -204 ml       Gen: WDWN 36 y.o. yo F NAD  Abdom:non tender, gravid     Fetal tracing:  Tocometer: no contractions  FH: reactive, no decelerations     Labs Reviewed:     Recent Results (from the past 24 hour(s))   Type and Screen    Collection Time: 09/22/21  7:55 AM   Result Value Ref Range    ABO Rh O Positive     AB Screen NEGATIVE    CBC and differential    Collection Time: 09/22/21  7:55 AM   Result Value Ref Range    WBC 9.4 4.0 - 11.0 K/cmm    RBC 3.45 (L) 3.80 - 5.00 M/cmm    Hemoglobin 10.1 (L) 12.0 - 16.0 gm/dL    Hematocrit 16.1 (L) 36.0 - 48.0 %    MCV 86 80 - 100 fL    MCH 29 28 - 35 pg    MCHC 34 32 - 36 gm/dL    RDW 09.6 (H) 04.5 - 14.0 %    PLT CT 274 130 - 440 K/cmm    MPV 7.0 6.0 - 10.0 fL     Neutrophils % 72.1 42.0 - 78.0 %    Lymphocytes 20.3 15.0 - 46.0 %    Monocytes 6.1 3.0 - 15.0 %    Eosinophils % 1.0 0.0 - 7.0 %    Basophils % 0.4 0.0 - 3.0 %    Neutrophils Absolute 6.8 1.7 - 8.6 K/cmm    Lymphocytes Absolute 1.9 0.6 - 5.1 K/cmm    Monocytes Absolute 0.6 0.1 - 1.7 K/cmm    Eosinophils Absolute 0.1 0.0 - 0.8 K/cmm    Basophils Absolute 0.0 0.0 - 0.3 K/cmm   Fibrinogen    Collection Time: 09/22/21  7:55 AM   Result Value Ref Range    Fibrinogen 473 (H) 181 - 469 mg/dL   PT/APTT    Collection Time: 09/22/21  7:55 AM   Result Value Ref Range    PT 10.5 9.7 - 11.8 sec    PT INR 1.0 0.9 - 1.1    aPTT 29.5 24.0 - 34.0 sec       Rads:   No results found.    MFM ULTRASOUND 09/22/21    Single intrauterine pregnancy noted with anterior complete placenta previa. A 7 cm retroplacental bleed is noted. Active singleton fetus without any new anomalies visualized on limited routine anatomic reexamination. Biometry suggests a fetus with an EFW at the approximately 61% for gestational age. Biophysical profile testing is equivocal at 6/8. Cephalic presentation.     Discussion/Plan:   Christine Andersen  is a 36 y.o. y.o. female  G2P0010 at [redacted]w[redacted]d admitted for placenta previa with multiple episodes of bleeding. This is her second admission for bleeding- she was admitted on 09/07/21. She  was admitted previously on 09/02/21 and received steroids > 2 weeks ago. She continues to have frequent bleeds and passed a larger clot this morning. She has not had cramping or leaking fluid and appreciates good fetal movement. Fetal monitoring has been reactive. She says she had a normal GDM screening.     We reviewed the ultrasound results. Her blood count and coags are stable and fetal cell stain is negative. Recommend a repeat dose of antenatal steroids as it has been > 2 weeks and she is only [redacted] weeks gestation. Deliver for significant/heavy bleeding, fetal distress, worsening maternal or fetal status.     Discussed with OB  attending Dr. Darcel Bayley     I spent:  10 minutes reviewing records prior to the visit.  10 minutes in patient contact.   XX minutes in other billable services.  10 minutes charting, conferring with consultants, etc.    __30___ Total Time      Lura Em, DO  09/22/2021  11:19 AM

## 2021-09-22 NOTE — Consults (Signed)
4/21 SW MM: SW consult received and completed. SW met w/ pt at bedside and introduced self and CM role. Consult re: prolonged hospital stay. Pt shared that she was feeling a little depressed and really cooped up r/t the hospital stay. She shared that she recently moved rooms from one smaller one that looked at a brick wall into the L&D room which is more open. Pt shared this helped a lot as well as being able to go outside on the balcony and get fresh air. SW answered questions about PPD and if score is high, will see myself or RNCM again. Pt shared potential plan for delivery in the next couple of days. MOB denies other concerns or questions at this time. SW encouraged pt to reach out to RN if SW is needed and will come back. SW will follow along and offer support as able.       Jobie Quaker, MSW  Social Worker, ext [phone]  09/22/21 11:48 AM

## 2021-09-22 NOTE — Progress Notes (Signed)
ANTEPARTUM DAILY NOTE    Had bleeding yesterday and again this am. Pads this am weighed for 143 gm.  Scant blood on pad now.  Denies cramping, reports good fetal movement.  FHTs 150s, reassuring.  Vitals stable.  Labs ordered.  Discussed increased bleeding episodes.  Will have MFM see pt today.

## 2021-09-22 NOTE — Progress Notes (Signed)
MFM sono reports 7cm retroplacental clot, still previa.  Having intermittent light trickling of blood.  Labs stable.  Plan repeat betamethasone course.  Discussed with pt the plan for close observation, delivery if needed.

## 2021-09-22 NOTE — Progress Notes (Addendum)
Patient rang emergency bell while in bathroom at 0550 due to vaginal bleeding. She stated waking up from her sleep and feeling a gush. The patients peach pad was saturated when nursing arrived with blood in the toilet. New pad applied and prior to patient getting back into bed she felt another gush. There was a golf ball sized clot in pad. Pads totaling a weight of 112 g. Patient advised to call if she feels more bleeding. She denies abdominal pain but is complaining of slight nausea which has been present for the last 24 hours. Patient denies straining on the toilet or vomiting prior to the bleeding beginning. Dr. Revonda Standard contacted and verbal order for blood labs were placed by this RN.

## 2021-09-23 ENCOUNTER — Inpatient Hospital Stay: Payer: Commercial Managed Care - POS | Admitting: Anesthesiology

## 2021-09-23 ENCOUNTER — Encounter
Admission: EM | Disposition: A | Discharge status: Home / Self Care | Payer: Self-pay | Source: Ambulatory Visit | Attending: Obstetrics & Gynecology

## 2021-09-23 ENCOUNTER — Encounter: Payer: Self-pay | Admitting: Obstetrics & Gynecology

## 2021-09-23 LAB — VH PREPARE/CROSSMATCH RED BLOOD CELLS 2 UNITS
01 -   Blood Type: O POS
02 -   Blood Type: O POS

## 2021-09-23 LAB — VH ISTAT PH, CORD BLOOD
BE, ISTAT: -2 mMol/L
PCO2, ISTAT: 47.6 mm Hg
PO2, ISTAT: 15 mm Hg
Room Number I-Stat: 0
pH, ISTAT: 7.32

## 2021-09-23 SURGERY — Surgical Case
Anesthesia: Regional | Site: Abdomen | Wound class: Clean Contaminated

## 2021-09-23 MED ORDER — LACTATED RINGERS IV SOLN
INTRAVENOUS | Status: DC | PRN
Start: 2021-09-23 — End: 2021-09-23

## 2021-09-23 MED ORDER — PROCHLORPERAZINE EDISYLATE 10 MG/2ML IJ SOLN
5.0000 mg | Freq: Once | INTRAMUSCULAR | Status: DC | PRN
Start: 2021-09-23 — End: 2021-09-23

## 2021-09-23 MED ORDER — DOCUSATE SODIUM 100 MG PO CAPS
200.0000 mg | ORAL_CAPSULE | Freq: Two times a day (BID) | ORAL | Status: DC | PRN
Start: 2021-09-23 — End: 2021-09-26
  Administered 2021-09-25: 200 mg via ORAL
  Filled 2021-09-23: qty 2

## 2021-09-23 MED ORDER — DROPERIDOL 2.5 MG/ML IJ SOLN
0.6250 mg | Freq: Once | INTRAMUSCULAR | Status: DC | PRN
Start: 2021-09-23 — End: 2021-09-23

## 2021-09-23 MED ORDER — TRANEXAMIC ACID-NACL 1000-0.7 MG/100ML-% IV SOLN
INTRAVENOUS | Status: DC
Start: 2021-09-23 — End: 2021-09-23
  Filled 2021-09-23: qty 100

## 2021-09-23 MED ORDER — FENTANYL CITRATE (PF) 50 MCG/ML IJ SOLN (WRAP)
25.0000 ug | INTRAMUSCULAR | Status: DC | PRN
Start: 2021-09-23 — End: 2021-09-23

## 2021-09-23 MED ORDER — ONDANSETRON HCL 4 MG/2ML IJ SOLN
4.0000 mg | Freq: Once | INTRAMUSCULAR | Status: DC | PRN
Start: 2021-09-23 — End: 2021-09-26

## 2021-09-23 MED ORDER — SODIUM CHLORIDE (PF) 0.9 % IJ SOLN
3.0000 mL | Freq: Two times a day (BID) | INTRAMUSCULAR | Status: DC
Start: 2021-09-24 — End: 2021-09-24

## 2021-09-23 MED ORDER — KETOROLAC TROMETHAMINE 15 MG/ML IJ SOLN
15.0000 mg | Freq: Four times a day (QID) | INTRAMUSCULAR | Status: AC | PRN
Start: 2021-09-23 — End: 2021-09-24
  Administered 2021-09-23: 15 mg via INTRAVENOUS
  Filled 2021-09-23 (×2): qty 1

## 2021-09-23 MED ORDER — PRENATAL 27-0.8 MG PO TABS
1.0000 | ORAL_TABLET | Freq: Every day | ORAL | Status: DC
Start: 2021-09-23 — End: 2021-09-26
  Administered 2021-09-24: 1 via ORAL
  Filled 2021-09-23 (×3): qty 1

## 2021-09-23 MED ORDER — ONDANSETRON HCL 4 MG/2ML IJ SOLN
INTRAMUSCULAR | Status: AC
Start: 2021-09-23 — End: ?
  Filled 2021-09-23: qty 2

## 2021-09-23 MED ORDER — METOCLOPRAMIDE HCL 5 MG/ML IJ SOLN
10.0000 mg | Freq: Once | INTRAMUSCULAR | Status: DC | PRN
Start: 2021-09-23 — End: 2021-09-23

## 2021-09-23 MED ORDER — DIPHENHYDRAMINE HCL 50 MG/ML IJ SOLN
25.0000 mg | INTRAMUSCULAR | Status: DC | PRN
Start: 2021-09-23 — End: 2021-09-26

## 2021-09-23 MED ORDER — METHYLERGONOVINE MALEATE 0.2 MG/ML IJ SOLN
INTRAMUSCULAR | Status: AC | PRN
Start: 2021-09-23 — End: 2021-09-23
  Administered 2021-09-23: 0.2 mg via INTRAMUSCULAR

## 2021-09-23 MED ORDER — OXYCODONE HCL 5 MG PO TABS
5.0000 mg | ORAL_TABLET | Freq: Once | ORAL | Status: DC | PRN
Start: 2021-09-23 — End: 2021-09-23

## 2021-09-23 MED ORDER — BUPIVACAINE HCL (PF) 0.75 % IJ SOLN
INTRAMUSCULAR | Status: DC | PRN
Start: 2021-09-23 — End: 2021-09-23
  Administered 2021-09-23: 1.5 mL via INTRASPINAL

## 2021-09-23 MED ORDER — NIFEDIPINE 10 MG PO CAPS
10.0000 mg | ORAL_CAPSULE | ORAL | Status: DC | PRN
Start: 2021-09-23 — End: 2021-09-26

## 2021-09-23 MED ORDER — SIMETHICONE 80 MG PO CHEW
80.0000 mg | CHEWABLE_TABLET | Freq: Four times a day (QID) | ORAL | Status: DC | PRN
Start: 2021-09-23 — End: 2021-09-26

## 2021-09-23 MED ORDER — ONDANSETRON HCL 4 MG/2ML IJ SOLN
INTRAMUSCULAR | Status: DC | PRN
Start: 2021-09-23 — End: 2021-09-23
  Administered 2021-09-23: 4 mg via INTRAVENOUS

## 2021-09-23 MED ORDER — FENTANYL CITRATE (PF) 50 MCG/ML IJ SOLN (WRAP)
INTRAMUSCULAR | Status: DC | PRN
Start: 2021-09-23 — End: 2021-09-23
  Administered 2021-09-23: 15 ug via INTRATHECAL

## 2021-09-23 MED ORDER — ONDANSETRON HCL 4 MG/2ML IJ SOLN
4.0000 mg | Freq: Three times a day (TID) | INTRAMUSCULAR | Status: DC | PRN
Start: 2021-09-23 — End: 2021-09-26

## 2021-09-23 MED ORDER — SODIUM CHLORIDE (PF) 0.9 % IJ SOLN
0.1000 mg | INTRAMUSCULAR | Status: DC | PRN
Start: 2021-09-23 — End: 2021-09-26

## 2021-09-23 MED ORDER — ACETAMINOPHEN 10 MG/ML IV SOLN
INTRAVENOUS | Status: DC | PRN
Start: 2021-09-23 — End: 2021-09-23
  Administered 2021-09-23: 1000 mg via INTRAVENOUS

## 2021-09-23 MED ORDER — HYDROMORPHONE HCL 0.5 MG/0.5 ML IJ SOLN
0.5000 mg | INTRAMUSCULAR | Status: DC | PRN
Start: 2021-09-23 — End: 2021-09-23

## 2021-09-23 MED ORDER — DEXAMETHASONE SODIUM PHOSPHATE 4 MG/ML IJ SOLN (WRAP)
INTRAMUSCULAR | Status: DC | PRN
Start: 2021-09-23 — End: 2021-09-23
  Administered 2021-09-23: 4 mg via INTRAVENOUS

## 2021-09-23 MED ORDER — SODIUM CHLORIDE (PF) 0.9 % IJ SOLN
3.0000 mL | Freq: Three times a day (TID) | INTRAMUSCULAR | Status: DC
Start: 2021-09-23 — End: 2021-09-24

## 2021-09-23 MED ORDER — FENTANYL CITRATE (PF) 50 MCG/ML IJ SOLN (WRAP)
INTRAMUSCULAR | Status: AC
Start: 2021-09-23 — End: ?
  Filled 2021-09-23: qty 2

## 2021-09-23 MED ORDER — TETANUS-DIPHTH-ACELL PERTUSSIS 5-2.5-18.5 LF-MCG/0.5 IM SUSY
0.5000 mL | PREFILLED_SYRINGE | INTRAMUSCULAR | Status: AC | PRN
Start: 2021-09-23 — End: 2021-09-26
  Administered 2021-09-26: 0.5 mL via INTRAMUSCULAR
  Filled 2021-09-23: qty 0.5

## 2021-09-23 MED ORDER — PROCHLORPERAZINE EDISYLATE 10 MG/2ML IJ SOLN
5.0000 mg | INTRAMUSCULAR | Status: DC | PRN
Start: 2021-09-23 — End: 2021-09-26

## 2021-09-23 MED ORDER — DEXAMETHASONE SODIUM PHOSPHATE 4 MG/ML IJ SOLN
INTRAMUSCULAR | Status: AC
Start: 2021-09-23 — End: ?
  Filled 2021-09-23: qty 1

## 2021-09-23 MED ORDER — FENTANYL CITRATE (PF) 50 MCG/ML IJ SOLN (WRAP)
50.0000 ug | INTRAMUSCULAR | Status: DC | PRN
Start: 2021-09-23 — End: 2021-09-23

## 2021-09-23 MED ORDER — METHYLERGONOVINE MALEATE 0.2 MG/ML IJ SOLN
INTRAMUSCULAR | Status: AC
Start: 2021-09-23 — End: 2021-09-23
  Filled 2021-09-23: qty 1

## 2021-09-23 MED ORDER — ACETAMINOPHEN 325 MG PO TABS
325.0000 mg | ORAL_TABLET | ORAL | Status: DC | PRN
Start: 2021-09-23 — End: 2021-09-26

## 2021-09-23 MED ORDER — MISOPROSTOL 200 MCG PO TABS
1000.0000 ug | ORAL_TABLET | Freq: Once | ORAL | Status: DC | PRN
Start: 2021-09-23 — End: 2021-09-26

## 2021-09-23 MED ORDER — VH OXYTOCIN INFUSION 20 UNITS/1000 ML NS (POST PARTUM)
INTRAVENOUS | Status: DC | PRN
Start: 2021-09-23 — End: 2021-09-23
  Administered 2021-09-23: 125 mL/h via INTRAVENOUS

## 2021-09-23 MED ORDER — MEASLES, MUMPS & RUBELLA VAC IJ SOLR
0.5000 mL | INTRAMUSCULAR | Status: DC | PRN
Start: 2021-09-23 — End: 2021-09-26

## 2021-09-23 MED ORDER — OXYCODONE-ACETAMINOPHEN 5-325 MG PO TABS
1.0000 | ORAL_TABLET | ORAL | Status: DC | PRN
Start: 2021-09-23 — End: 2021-09-26
  Administered 2021-09-23 – 2021-09-25 (×12): 1 via ORAL
  Administered 2021-09-26: 2 via ORAL
  Administered 2021-09-26: 1 via ORAL
  Filled 2021-09-23 (×9): qty 1
  Filled 2021-09-23: qty 2
  Filled 2021-09-23 (×4): qty 1

## 2021-09-23 MED ORDER — HYDROMORPHONE HCL 0.5 MG/0.5 ML IJ SOLN
0.2500 mg | INTRAMUSCULAR | Status: DC | PRN
Start: 2021-09-23 — End: 2021-09-23

## 2021-09-23 MED ORDER — ONDANSETRON 4 MG PO TBDP
4.0000 mg | ORAL_TABLET | Freq: Three times a day (TID) | ORAL | Status: DC | PRN
Start: 2021-09-23 — End: 2021-09-26

## 2021-09-23 MED ORDER — IBUPROFEN 600 MG PO TABS
600.0000 mg | ORAL_TABLET | Freq: Four times a day (QID) | ORAL | Status: DC | PRN
Start: 2021-09-23 — End: 2021-09-26
  Administered 2021-09-23 – 2021-09-26 (×9): 600 mg via ORAL
  Filled 2021-09-23 (×9): qty 1

## 2021-09-23 MED ORDER — NALBUPHINE HCL 10 MG/ML IJ SOLN
2.5000 mg | INTRAMUSCULAR | Status: DC | PRN
Start: 2021-09-23 — End: 2021-09-26

## 2021-09-23 MED ORDER — MORPHINE SULFATE (PF) 0.5 MG/ML IJ SOLN
INTRAMUSCULAR | Status: AC
Start: 2021-09-23 — End: ?
  Filled 2021-09-23: qty 10

## 2021-09-23 MED ORDER — MISOPROSTOL 200 MCG PO TABS
ORAL_TABLET | ORAL | Status: AC
Start: 2021-09-23 — End: 2021-09-23
  Filled 2021-09-23: qty 5

## 2021-09-23 MED ORDER — VH OXYTOCIN INFUSION 20 UNITS/1000 ML NS (LABOR)
INTRAVENOUS | Status: AC
Start: 2021-09-23 — End: 2021-09-23
  Filled 2021-09-23: qty 1000

## 2021-09-23 MED ORDER — VH PHENYLEPHRINE 120 MCG/ML IV BOLUS (ANESTHESIA)
PREFILLED_SYRINGE | INTRAVENOUS | Status: DC | PRN
Start: 2021-09-23 — End: 2021-09-23
  Administered 2021-09-23 (×6): 120 ug via INTRAVENOUS
  Administered 2021-09-23: 240 ug via INTRAVENOUS
  Administered 2021-09-23 (×5): 120 ug via INTRAVENOUS
  Administered 2021-09-23: 240 ug via INTRAVENOUS

## 2021-09-23 MED ORDER — PROMETHAZINE HCL 12.5 MG RE SUPP
12.5000 mg | Freq: Four times a day (QID) | RECTAL | Status: DC | PRN
Start: 2021-09-23 — End: 2021-09-26

## 2021-09-23 MED ORDER — ACETAMINOPHEN 650 MG RE SUPP
325.0000 mg | RECTAL | Status: DC | PRN
Start: 2021-09-23 — End: 2021-09-26

## 2021-09-23 MED ORDER — MEPERIDINE HCL 25 MG/ML IJ SOLN
12.5000 mg | Freq: Once | INTRAMUSCULAR | Status: DC | PRN
Start: 2021-09-23 — End: 2021-09-23

## 2021-09-23 MED ORDER — MORPHINE SULFATE (PF) 0.5 MG/ML IJ SOLN
INTRAMUSCULAR | Status: DC | PRN
Start: 2021-09-23 — End: 2021-09-23
  Administered 2021-09-23: .15 mg via INTRATHECAL

## 2021-09-23 MED ORDER — STERILE WATER FOR INJECTION IJ SOLN
2.0000 g | Freq: Once | INTRAMUSCULAR | Status: AC
Start: 2021-09-23 — End: 2021-09-23
  Administered 2021-09-23: 2 g via INTRAVENOUS
  Filled 2021-09-23: qty 2000

## 2021-09-23 MED ORDER — DIPHENHYDRAMINE HCL 50 MG/ML IJ SOLN
25.0000 mg | Freq: Four times a day (QID) | INTRAMUSCULAR | Status: DC | PRN
Start: 2021-09-23 — End: 2021-09-23

## 2021-09-23 MED ORDER — CALCIUM CARBONATE ANTACID 500 MG PO CHEW
1000.0000 mg | CHEWABLE_TABLET | Freq: Three times a day (TID) | ORAL | Status: DC | PRN
Start: 2021-09-23 — End: 2021-09-26

## 2021-09-23 MED ORDER — LACTATED RINGERS IV SOLN
125.0000 mL/h | INTRAVENOUS | Status: AC
Start: 2021-09-23 — End: 2021-09-24
  Administered 2021-09-23: 125 mL/h via INTRAVENOUS

## 2021-09-23 MED ORDER — PROMETHAZINE HCL 25 MG PO TABS
25.0000 mg | ORAL_TABLET | Freq: Four times a day (QID) | ORAL | Status: DC | PRN
Start: 2021-09-23 — End: 2021-09-26

## 2021-09-23 MED ORDER — VH OXYTOCIN INFUSION 20 UNITS/1000 ML NS (POST PARTUM)
7.5000 [IU]/h | INTRAVENOUS | Status: AC
Start: 2021-09-23 — End: 2021-09-23

## 2021-09-23 SURGICAL SUPPLY — 17 items
BANDAID EXTRA LARGE (Dressings) ×2 IMPLANT
BLADE CLIPPER (Supply) ×2 IMPLANT
GLOVE SURGEON TRIFLEX SZ 7 (Supply) ×2 IMPLANT
MARKER SKIN RULER #130 (Supply) ×2 IMPLANT
PACK C-SECTION (Supply) ×2 IMPLANT
RESUSITATOR NEONATAL (Supply) ×2 IMPLANT
SLEEVE SCD KNEE #5329 (Supply) ×2 IMPLANT
SOL SALINE IRRIG 500ML (Supply) ×2 IMPLANT
STAPLER SKIN DISP35WIDE#PXW35 (Supply) ×2 IMPLANT
SUCTION POOLE (Supply) ×2 IMPLANT
SUT CHROMIC GUT 1 915H (Supply) ×6 IMPLANT
SUT MONOCRYL 2-0 Y351H (Supply) ×4 IMPLANT
SUT VICRYL 1 J359H (Supply) ×2 IMPLANT
SUT VICRYL 2-0 J357H (Supply) ×2 IMPLANT
SUT VICRYL 3-0 J516H (Supply) ×4 IMPLANT
TRAY URINEMETER FOLEY LATEXFRE (TDC (Tubes, Draines, Catheters)) ×1
TRAY URINEMETER FOLEY LATEXFRE (TDC (Tubes, Drains, Catheters)) ×1 IMPLANT

## 2021-09-23 NOTE — Anesthesia Preprocedure Evaluation (Signed)
Anesthesia Evaluation    AIRWAY    Mallampati: III    TM distance: >3 FB  Neck ROM: full  Mouth Opening:full   CARDIOVASCULAR    cardiovascular exam normal       DENTAL    no notable dental hx               PULMONARY    pulmonary exam normal     OTHER FINDINGS    BMI 41  H/o seizures, allergic to keppra, was on lamictal, stopped, no seizures since.    Lab             09/02/21     09/04/21     09/22/21                       2345          0944          0755          HGB          10.6*          < >        10.1*         HCT          30.9*          < >        29.7*         PLT          299            < >        274           CREAT        0.62          --           --           K            3.6           --           --           ABORH        O Positive     < >        O Positive    ABSCRN       NEGATIVE       < >        NEGATIVE       < > = values in this interval not displayed.               ]                                      Relevant Problems   NEURO/PSYCH   (+) Seizure disorder   (+) Seizures               Anesthesia Plan    ASA 3 - emergent     spinal               (Risks, benefits and alternatives discussed of neuraxial anesthesia, including failed block, post-dural puncture headache, infection, bleeding, nerve injury, nausea, pain, shivering, blood pressure changes, itching, conversion to general anesthesia, high spinal block and local anesthesia toxicity. Questions answered, pt accepts.    Risks, benefits and alternatives discussed of general  anesthesia, including airway issues, aspiration,  nausea, pain, cardiac and or neurologic issues, prolonged mechanical ventilation , ICU stay, damage to teeth and eyes. Questions answered. Pt or guardian accepts.)              Post op pain management: intrathecal    informed consent obtained      pertinent labs reviewed             Signed by: Kristen Loader, MD 09/23/21 4:08 AM

## 2021-09-23 NOTE — Transfer of Care (Signed)
Anesthesia Transfer of Care Note    Patient: Christine Andersen    Last vitals:   Vitals:    09/23/21 0524   BP: 113/43   Pulse: (!) 112   Resp: 16   Temp: 36.4 C (97.5 F)   SpO2: 100%       Oxygen: Room Air     Mental Status:awake    Airway: Natural    Cardiovascular Status:  stable

## 2021-09-23 NOTE — Nursing Progress Note (Signed)
Pt assisted to the BR after feeling like she had another gush. 29ml weighed from her peripad. Fluid noted in the hat in the toilet was a dark red color. Appeared to be more blood than urine. Pads changed and assisted back to bed at this time. Will continue to monitor.

## 2021-09-23 NOTE — Progress Notes (Signed)
Pt with increase in bleeding amounts and frequency tonight.  Also cramping and some UCs traced on toco.  FHTs 140-150s.  Discussed with patient and recommend delivery now due to increase in bleeding and contractions. She understands and agrees.  Procedure for cesarean and possible blood transfusion reviewed, risks/benefits discussed and she signs consents.  NICU aware.

## 2021-09-23 NOTE — Anesthesia Procedure Notes (Signed)
Spinal      Patient location during procedure: L&D  Reason for block: labor    Block at Surgeon's request: Yes      Start time: 09/23/2021 4:10 AM    End time: 09/23/2021 4:17 AM    Staffing  Performed: anesthesiologist   Anesthesiologist: Kristen Loader, MD  Performed by: Kristen Loader, MD  Authorized by: Kristen Loader, MD        Pre-procedure Checklist   Completed: patient identified, surgical consent, pre-op evaluation, timeout performed, risks and benefits discussed, monitors and equipment checked, anesthesia consent given and correct site  Timeout Completed:  09/23/2021 4:10 AM    Spinal  Patient monitoring: pulse oximetry, EKG and NIBP    Premedication: No    Patient position: sitting    Sterile Technique: chlorhexidine gluconate and isopropyl alcohol, patient draped, mask used and wearing gloves  Skin Local: lidocaine 1%        Approach: midline  Number of attempts: 1      Needle Placement    Needle gauge: 25      Intrathecal space entered at L4-5        Paresthesia Pain: no    Catheter Placement   CSF Return: Yes  Blood Return: No              Assessment   Sensory level: T6  Block Outcome: patient tolerated procedure well and no complications

## 2021-09-23 NOTE — Plan of Care (Signed)
Pain controlled with Percocet and Toradol.  Up to NICU and visiting with infant.  Catheter out and voided times one.  IV leaking and removed, patient refused new line.  Pumping breasts, supplies given.  Fundus firm and 1 blow the umbilicus.  Lochia small rubra.  Low transverse incision with dressing on and intact.    Problem: Vaginal/Cesarean Delivery  Goal: Postpartum management of pain/discomfort  Outcome: Progressing  Flowsheets (Taken 09/23/2021 1908)  Postpartum management of pain/discomfort:   Assess pain using a consistent, developmental/age appropriate pain scale   Assess pain level before and following intervention   Include patient/patient care companion in decisions related to pain management   Offer non-pharmacologic pain management interventions  Goal: Breasts are soft with nipple integrity intact  Outcome: Progressing  Flowsheets (Taken 09/23/2021 1908)  Breasts are soft with nipple integrity intact: Breastfeed and/or pump breasts at least 8-12 times within 24 hours  Goal: Gastrointestinal/Urinary management  Outcome: Progressing  Flowsheets (Taken 09/23/2021 1908)  Gastrointestinal/Urinary management:   Maintain urine output of 30/mL per hour or greater   Adequate bladder emptying per phase of care   Monitor intake and output per orders  Goal: Uterine management  Outcome: Progressing  Flowsheets (Taken 09/23/2021 1908)  Uterine management: Assess fundus and notify LIP if not firm, midline, or at or below the umbilicus, or if abdomen is abnormally distended  Goal: Perineum will be clean, dry, and intact and without discharge or hematoma  Outcome: Progressing  Flowsheets (Taken 09/23/2021 1908)  Perineum will be clean, dry, and intact and without discharge or hematoma: Provide pericare  Goal: Incision will be clean, dry, and intact and without discharge or hematoma  Outcome: Progressing  Flowsheets (Taken 09/23/2021 1908)  Incision will be clean, dry and intact and without discharge or hematoma:   Assess  abdominal dressing and incision   Monitor incision for signs of infections   Assess incision for evidence of healing   Assess incision for bleeding/hematoma  Goal: VTE Prevention  Outcome: Progressing  Flowsheets (Taken 09/23/2021 1908)  VTE prevention:   Patient ambulating independently   Assess skin color, turgor, peripheral pulses, perfusion, and presence of edema, e.g. capillary refill  Goal: Evidence of positive mother-baby interactions  Outcome: Progressing  Flowsheets (Taken 09/23/2021 1908)  Evidence of positive mother-baby interactions:   Include patient/patient care companion in decisions related to care   Assess emotional status and coping mechanisms  Note: Infant in NICU

## 2021-09-23 NOTE — Nursing Progress Note (Addendum)
Rn to room to adjust FM. While adjusting FM, pt states she felt a "gush". RN assisted pt to the BR at this time where she felt a clot come out along with some blood. Blood noted on peripad that also went onto underwear. Clot was the size of a golf ball. QBL was 75ml from what could be weighed. Some blood was mixed in urine hat as well as a small amount on pts underwear. FHR reobtained. Pt states she is having cramping at this time which she rates 1/10. Will continue to monitor and will notify MD.

## 2021-09-23 NOTE — Anesthesia Postprocedure Evaluation (Signed)
Anesthesia Post Evaluation    Patient: Christine Andersen    Procedure(s):  CESAREAN SECTION    Anesthesia type: spinal    Last Vitals:   Vitals Value Taken Time   BP 113/43 09/23/21 0524   Temp 36.4 C (97.5 F) 09/23/21 0524   Pulse 112 09/23/21 0524   Resp 16 09/23/21 0524   SpO2 100 % 09/23/21 0524                 Anesthesia Post Evaluation  Anesthesia Post Operative Evaluation      Patient Name:     Christine Andersen    Procedures Performed:     Procedure(s):  CESAREAN SECTION    Anesthesia Type:      Spinal    Patient Location:     PACUC/PACU-C    Last vitals:     Temp: Temp: 36.4 C (97.5 F)   HR: Heart Rate: (!) 112   BP: BP: 113/43   RR: Resp Rate: 16   SpO2 SpO2: 100 %     Post Op Pain:      Patient not complaining of pain, continue current therapy     Mental Status:      Awake    Respiratory Function:      Tolerating RA    Cardiovascular:      Stable    Nausea/Vomiting:      Patient not complaining of nausea or vomiting    Hydration Status:      Adequate    Post Assessment:      no apparent anesthetic complications    Kristen Loader, MD  09/23/2021 5:26 AM    Signed by: Kristen Loader, MD, 09/23/2021 5:26 AM

## 2021-09-23 NOTE — Op Note (Signed)
Cesarean Section Procedure Note    Pre-operative Diagnosis: [redacted]w[redacted]d, Pre-Op Diagnosis Codes:     * [redacted] weeks gestation of pregnancy [Z3A.29] Placenta Previa with Vaginal Bleeding  Post-operative Diagnosis: same, live preterm female infant delivered    PROCEDURE : Procedure(s):  CESAREAN SECTION - Classical Vertical Incision    Surgeon: Betsy Pries, MD MD    Assistant: M.Lonna Cobb, CST    Findings:  A viable female  infant with APGARs of 8  and 9  at 1 and 5 minutes respectively.  Weight 3 lb 3 oz (1445 g) .  Normal fallopian tubes and ovaries were seen bilaterally.  Anterior placenta previa.    Estimated Blood Loss:   800 ml           Specimens:    None           Complications:  None; patient tolerated the procedure well.      Surgeon: Betsy Pries, MD     Assistants:     Anesthesia: Spinal anesthesia    ASA Class: 2    Procedure Details   The patient was seen in the Holding Room. The risks, benefits, complications, treatment options, and expected outcomes were discussed with the patient.  The patient concurred with the proposed plan, giving informed consent.  The site of surgery properly noted. The patient was taken to Operating Room, identified as Christine Andersen and the procedure verified as C-Section Delivery without surgical sterilization. A Time Out was held and the above information confirmed.    After induction of anesthesia, the patient was draped and prepped in the usual sterile manner. A Pfannenstiel incision was made and carried down through the subcutaneous tissue to the fascia. Fascial incision was made and extended transversely with Mayo scissors. The fascia was separated from the underlying rectus tissue superiorly and inferiorly using blunt and sharp dissection. The peritoneum was identified and entered digitally. Peritoneal incision was extended digitally in a cephalo-caudad fashion. . The utero-vesical peritoneal reflection was incised transversely and the bladder flap was bluntly freed from the  lower uterine segment.  The placenta was noted anterior over the lower uterine segment with vascularity visualized through the uterine serosa. Therefore decision was made to make a low vertical uterine incision, and it was extended superiorly when the placenta was encountered upon uterine entry.  Infant was delivered atraumatically. After the umbilical cord was clamped and cut cord blood was obtained for evaluation. The placenta was delivered by manual extraction. It was intact and appeared normal.  The uterus was cleared of all clot and debris using a laparotomy sponge.  Uterus, tubes and ovaries appeared normal. The uterine incision was closed in 3 layers, the first a running locked stitch of 1 chromic; the second was a running layer of 1 chromic. And the final layer was a baseball serosal stitch of 2-0 monocryl.  Hemostasis was observed.  The peritoneum was closed using 2-0 Vicryll. The fascia was then reapproximated with a running suture of 1 Vicryl.  The subcutaneous tissue did not require closure with 3-0 vicryl. The skin was reapproximated with skin staplesl.    Instrument, sponge, and needle counts were correct after uterine closure, prior the abdominal closure, and at the conclusion of the case.    Betsy Pries, MD MD 09/23/2021

## 2021-09-23 NOTE — Plan of Care (Signed)
Patient is stable and ambulating independently. Patient is voiding adequately. Patient is breastfeeding and rooming in with baby. Patient's pain has been well controlled with medication. Encouraged use of IS and ambulation. Patient has been educated on falls prevention/ safety and breastfeeding techniques and cues. POC reviewed with patient. Denies needs at this time. Call bell is in reach, instructed on how to use. Whiteboard updated.         Problem: Vaginal/Cesarean Delivery  Goal: Postpartum management of pain/discomfort  Outcome: Progressing  Flowsheets (Taken 09/23/2021 2227)  Postpartum management of pain/discomfort:   Assess pain using a consistent, developmental/age appropriate pain scale   Assess pain level before and following intervention   Monitor for post anesthesia issues related to pain management   Include patient/patient care companion in decisions related to pain management   Report ineffective pain management to LIP   Offer non-pharmacologic pain management interventions  Goal: Breasts are soft with nipple integrity intact  Outcome: Progressing  Flowsheets (Taken 09/23/2021 2227)  Breasts are soft with nipple integrity intact: Breastfeed and/or pump breasts at least 8-12 times within 24 hours  Goal: Gastrointestinal/Urinary management  Outcome: Progressing  Flowsheets (Taken 09/23/2021 2227)  Gastrointestinal/Urinary management: Adequate bladder emptying per phase of care  Goal: Uterine management  Outcome: Progressing  Flowsheets (Taken 09/23/2021 2227)  Uterine management:   Assess fundus and notify LIP if not firm, midline, or at or below the umbilicus, or if abdomen is abnormally distended   Assess for hemorrhage risk using appropriate screening tool  Goal: Perineum will be clean, dry, and intact and without discharge or hematoma  Outcome: Progressing  Flowsheets (Taken 09/23/2021 2227)  Perineum will be clean, dry, and intact and without discharge or hematoma: Provide pericare  Goal: Incision  will be clean, dry, and intact and without discharge or hematoma  Outcome: Progressing  Flowsheets (Taken 09/23/2021 2227)  Incision will be clean, dry and intact and without discharge or hematoma:   Assess abdominal dressing and incision   Monitor incision for signs of infections   Assess incision for bleeding/hematoma   Assess incision for evidence of healing  Goal: VTE Prevention  Outcome: Progressing  Flowsheets (Taken 09/23/2021 2227)  VTE prevention:   Patient ambulating independently   Assess skin color, turgor, peripheral pulses, perfusion, and presence of edema, e.g. capillary refill  Goal: Evidence of positive mother-baby interactions  Outcome: Progressing  Flowsheets (Taken 09/23/2021 2227)  Evidence of positive mother-baby interactions:   Assess emotional status and coping mechanisms   Include patient/patient care companion in decisions related to care   Notify LIP and case management if risk factors are identified

## 2021-09-23 NOTE — Progress Notes (Signed)
Pt transferred back to room 226 post c-section. Consulting civil engineer in to give education. Baby in NICU. Husband at bedside. Small amt of bleeding noted with fundal rub. Dr. Darcel Bayley advised to hold off on giving Toradol "for a couple hours" to make sure bleeding remains stable. Denies nausea at this time, but starting to feel pain from surgery. Will give PRN Percocet. Pt states in the past she didn't tolerate it, but it sounds like it was more due to constipation side effect. Will monitor.

## 2021-09-24 LAB — CBC AND DIFFERENTIAL
Basophils %: 0.5 % (ref 0.0–3.0)
Basophils Absolute: 0.1 10*3/uL (ref 0.0–0.3)
Eosinophils %: 1 % (ref 0.0–7.0)
Eosinophils Absolute: 0.1 10*3/uL (ref 0.0–0.8)
Hematocrit: 23.2 % — ABNORMAL LOW (ref 36.0–48.0)
Hemoglobin: 7.8 gm/dL — ABNORMAL LOW (ref 12.0–16.0)
Lymphocytes Absolute: 2.6 10*3/uL (ref 0.6–5.1)
Lymphocytes: 22.7 % (ref 15.0–46.0)
MCH: 30 pg (ref 28–35)
MCHC: 34 gm/dL (ref 32–36)
MCV: 89 fL (ref 80–100)
MPV: 7.4 fL (ref 6.0–10.0)
Monocytes Absolute: 1 10*3/uL (ref 0.1–1.7)
Monocytes: 9.1 % (ref 3.0–15.0)
Neutrophils %: 66.8 % (ref 42.0–78.0)
Neutrophils Absolute: 7.6 10*3/uL (ref 1.7–8.6)
PLT CT: 220 10*3/uL (ref 130–440)
RBC: 2.6 10*6/uL — ABNORMAL LOW (ref 3.80–5.00)
RDW: 14.3 % — ABNORMAL HIGH (ref 11.0–14.0)
WBC: 11.3 10*3/uL — ABNORMAL HIGH (ref 4.0–11.0)

## 2021-09-24 MED ORDER — FERROUS SULFATE 325 (65 FE) MG PO TABS
325.0000 mg | ORAL_TABLET | Freq: Two times a day (BID) | ORAL | Status: DC
Start: 2021-09-24 — End: 2021-09-26
  Administered 2021-09-24: 325 mg via ORAL
  Filled 2021-09-24 (×3): qty 1

## 2021-09-24 NOTE — Plan of Care (Signed)
Problem: Vaginal/Cesarean Delivery  Goal: Postpartum management of pain/discomfort  Outcome: Progressing  Goal: Breasts are soft with nipple integrity intact  Outcome: Progressing  Goal: Gastrointestinal/Urinary management  Outcome: Progressing  Goal: Uterine management  Outcome: Progressing  Goal: Perineum will be clean, dry, and intact and without discharge or hematoma  Outcome: Progressing  Goal: Incision will be clean, dry, and intact and without discharge or hematoma  Outcome: Progressing  Goal: VTE Prevention  Outcome: Progressing  Goal: Evidence of positive mother-baby interactions  Outcome: Progressing

## 2021-09-24 NOTE — Progress Notes (Signed)
Date Time: 09/24/2021  9:42 AM    Progress Note   -  Postpartum Day 1    Delivery Type: Cesarean Section     Subjective:   No complaints. The patient feels well.  She denies issues with pain.  She is ambulating without dizziness.  She is voiding without difficulty.  .  The RN reports Amount: Scant lochia.    Objective:   Temp:  [97.1 F (36.2 C)-98.8 F (37.1 C)] 98.1 F (36.7 C)  Heart Rate:  [91-107] 92  Resp Rate:  [17-18] 18  BP: (92-126)/(56-66) 92/66     Physical Exam:   Gen:  Alert and oriented, no acute distress  Abd: soft, nontender, fundus firm at umbilicus  Ext: no calf tenderness    LABS:     Results       Procedure Component Value Units Date/Time    CBC and differential [[id number]]  (Abnormal) Collected: 09/24/21 0510    Specimen: Blood Updated: 09/24/21 0622     WBC 11.3 K/cmm      RBC 2.60 M/cmm      Hemoglobin 7.8 gm/dL      Hematocrit 84.1 %      MCV 89 fL      MCH 30 pg      MCHC 34 gm/dL      RDW 32.4 %      PLT CT 220 K/cmm      MPV 7.4 fL      Neutrophils % 66.8 %      Lymphocytes 22.7 %      Monocytes 9.1 %      Eosinophils % 1.0 %      Basophils % 0.5 %      Neutrophils Absolute 7.6 K/cmm      Lymphocytes Absolute 2.6 K/cmm      Monocytes Absolute 1.0 K/cmm      Eosinophils Absolute 0.1 K/cmm      Basophils Absolute 0.1 K/cmm     Prepare/Crossmatch Red Blood Cells 2 Units, 2 Units [[id number]] Collected: 09/23/21 0333    Specimen: Blood Updated: 09/23/21 1944     01 - Product ID Red Blood Cells     01 - Unit Number [id number]     01 -   Cross Match Compatible     01 -   Status Info Released     01 - Product Code [id number]     01 -   Blood Type O Pos     02 - Product ID Red Blood Cells     02 - Unit Number [id number]     02 -   Cross Match Compatible     02 -   Status Info Released     02 - Product Code [id number]     02 -   Blood Type O Pos            Assessment:   Patient doing well.    PLAN:   Continue routine postpartum care. and Infant I NICU  Start BID oral iron for post-op  anemia.    Betsy Pries, MD  09/24/2021

## 2021-09-25 NOTE — Plan of Care (Signed)
Pt. Up ambulating in halls.  Pt. Passing flatulence and had a BM today.  Percocet and Motrin taking care of pain.

## 2021-09-25 NOTE — Consults (Signed)
Please see lactation note.

## 2021-09-25 NOTE — Lactation Note (Signed)
In to see patient for initial lactation consult. Mother Christine Andersen is a P1 with infant born at 29.[redacted] weeks gestation, birthweight 3 lbs 2 ounces, currently 52 hours of life.  At this time, mom remains a pt on Mother Baby unit and infant is in the NICU. Praised mother on her desire to provide her son with breastmilk.  Mother had a breast reduction at age 36.  Discussed potential implications with milk supply.  Patient verbalizes understanding, and wants to continue pumping for infant in NICU. She hopes to directly latch her new baby when he is ready.         Christine Andersen was set up previously with a Medela Symphony breast pump per nursing staff. Reviewed the use of the initiation and maintenance phases. Patient verbalized understanding that she would switch to the maintenance phase after three consecutive pumps of 20 mL.  She has been provided with wash and air dry basins, dish detergent, and micro-steam bag and instructed to disassemble and wash pump parts after each use and sterilize parts once every 24 hours.  Mother verbalizes understanding of proper cleaning techniques for pump parts.  Reinforced with her that she should be pumping 8-12 times in 24 hours for a minimum of 15 minutes with one 5 hour stretch at night if needed.  Reviewed pumping at least every 2-3 hours and sooner if desired.  Pumping log book given to patient to assist with tracking pumping sessions and milk volumes.  Patient verbalizes understanding on use of log book.        Asking appropriate questions including the best ways to store breastmilk for her baby.  Mom is aware to keep each pumping session separate and understands she needs to refrigerate milk soon after pumping if not able to take directly to her infant in the NICU.  Also understands to freeze milk if it will be over 48 hours before she can get to the hospital after discharge. Reviewed proper labeling and dating of breastmilk for NICU use.     Educated Christine Andersen on proper sizing of flanges; she is  currently pumping with 19 mm flanges and finds them to be comfortable. Mom is aware of potential need to adjust flange sizing if she begins to feel any discomfort with pumping. LC at bedside during duration of pumping session; small drops of colostrum noted on nipple post-pumping.     Following the pumping session, I reviewed breast massage and hand expression.  I assisted mom in hand expressing a scant amount of colostrum which she then placed on a Q-tip to take to infant in NICU.  Christine Andersen was unable to express colostrum on her own. Recommended viewing [employer] videos on Hand Expression and Maximizing Milk Production to reinforce concepts. Discussed incorporating breast massage and hand expression into each pumping session.     Christine Andersen does not yet have a breast pump for home use. Discussed process for obtaining a pump and [employer] pamphlet provided. Also discussed Medela Symphony pump rental from [employer]. Mother encouraged to pump while at her baby's bedside using the hospital Symphony electric pump.  Patient instructed to take all her pump parts and supplies to infant's bedside upon discharge.     Discussed the benefits of skin to skin with Christine Andersen, and she verbalizes her intent to do kangaroo care with her son when infant is able. She understands as infant shows readiness to feed at the breast, that she can request staff to call for assistance in educating her on positioning and  feeding her newborn.  All questions encouraged and answered. Mother verbalizes understanding of teaching and denies further needs at this time.    I referred pt to the breastfeeding chapter in Your Guide to Postpartum and Newborn Care booklet and to the interactive learning application therein. Breastfeeding Resource handout given and discussed with community resources for after discharge.     LC's will continue to follow this mother during LC rounding, which may not occur daily.  Please place a consult order or phone call [phone]) if a  specific or time sensitive lactation need arises.         09/25/21 0920   Lactation Consultation   Visit Type  Initial Assessment   Maternal Information   Breastfeeding Status Yes   Previous Breastfeeding Experience No   Taking meds incompatible with breastfeeding N   Storing milk  Y   Pumping Experience N   Pumping this hospitalization Y   Dumping Milk N   Newborn Feeding Preference   Which feeding method will be used? Both - Breast milk and formula   Breast Assessment   Left Breast Large;Firm, Taut;Colostrum Present   Left Nipple Medium;Flat;Short;Bruised;Sensitive   Right Breast Large;Firm, Taut;Colostrum Present   Right Nipple Medium;Flat;Short;Sensitive;Bruised   Hand expression done (if baby in NICU) Yes   Pumping done Yes   Equipment   Equipment Hospital grade pump     Lorin Picket, RN, MSN

## 2021-09-25 NOTE — Progress Notes (Signed)
Date Time: 09/25/2021  7:59 AM    Progress Note   -  Postpartum Day 2    Delivery Type: Cesarean Section     Subjective:   No complaints. The patient feels well.  She denies issues with pain.  She is ambulating without dizziness.  She is voiding without difficulty.  .  The RN reports Amount: Scant lochia.    Objective:   Temp:  [97.3 F (36.3 C)-98.1 F (36.7 C)] 97.3 F (36.3 C)  Heart Rate:  [92-101] 95  Resp Rate:  [16-18] 17  BP: (92-120)/(63-67) 112/67     Physical Exam:   Gen:  Alert and oriented, no acute distress  Abd: soft, nontender, fundus firm at umbilicus  Inc: C/D/I with staples in place  Ext: no calf tenderness    LABS:     Results       ** No results found for the last 24 hours. **            Assessment:   Patient doing well.    PLAN:   Continue routine postpartum care.  Breastpumping/ feeding baby boy in NICU  Undecided on PP contraception  Desires circumcision for son    Truddie Crumble, MD  09/25/2021

## 2021-09-25 NOTE — UM Notes (Addendum)
[facility] Utilization Management Review Sheet    Facility :  [facility]    NAME: Christine Andersen  MR#: [id number]    CSN#: [id number]    ROOM: 226/226-A AGE: 36 y.o.    Date of Birth: 10-06-1985    ADMIT DATE AND TIME: 09/07/2021  1:07 PM      PATIENT CLASS:     ATTENDING PHYSICIAN: Betsy Pries, MD  PAYOR:Payor: [employer] / Plan: CIGNA POS / Product Type: COMMERCIAL /     Subscriber ID:  [id number]     AUTH #:  [id number]    DIAGNOSIS:  Principal Problem:    Vaginal bleeding  C/S delivery      ICD-10-CM    1. Pregnancy, unspecified gestational age  Z27.90 MFM Ultrasound     MFM Ultrasound          HISTORY:   Past Medical History:   Diagnosis Date    ADHD (attention deficit hyperactivity disorder)     PCOS (polycystic ovarian syndrome)     Placenta previa     Seizures        DATE OF REVIEW: 09/25/2021    VITALS: BP 112/67   Pulse 95   Temp 97.3 F (36.3 C) (Temporal)   Resp 17   Ht 1.651 m (5\' 5" )   Wt 113.3 kg (249 lb 11.2 oz)   LMP 08/05/2020   SpO2 98%   Breastfeeding Yes   BMI 41.55 kg/m     Active Hospital Problems    Diagnosis    Vaginal bleeding     [id number]:  Increase in bleeding.  C/O abdominal cramping , contractions q 3-5 min on toco lasting 60-80 seconds, mild.  C/S delivery-classical.  G2P1  EDC:  [id number]  C/S on 042223 @ 4:36 am  Female 3 lbs 1.9 oz 1415 g  Apgar:  8/9  Gestation:  29 wks 1 day  Baby to NICU.    MCG:  S-350    Rush Barer. Ayyub Krall, Airline pilot [phone]  Fax:  [phone]  [email or web address]

## 2021-09-26 ENCOUNTER — Other Ambulatory Visit: Payer: Self-pay

## 2021-09-26 DIAGNOSIS — Z4800 Encounter for change or removal of nonsurgical wound dressing: Secondary | ICD-10-CM

## 2021-09-26 MED ORDER — NALOXONE HCL 4 MG/0.1ML NA LIQD
NASAL | 0 refills | Status: DC
Start: 2021-09-26 — End: 2021-10-17

## 2021-09-26 MED ORDER — DSS 100 MG PO CAPS
200.0000 mg | ORAL_CAPSULE | Freq: Two times a day (BID) | ORAL | Status: DC | PRN
Start: 2021-09-26 — End: 2021-10-17

## 2021-09-26 MED ORDER — ACETAMINOPHEN 325 MG PO TABS
325.0000 mg | ORAL_TABLET | Freq: Four times a day (QID) | ORAL | Status: DC | PRN
Start: 2021-09-26 — End: 2021-10-17

## 2021-09-26 MED ORDER — OXYCODONE-ACETAMINOPHEN 5-325 MG PO TABS
1.0000 | ORAL_TABLET | Freq: Four times a day (QID) | ORAL | 0 refills | Status: AC | PRN
Start: 2021-09-26 — End: 2021-10-03

## 2021-09-26 MED ORDER — IBUPROFEN 600 MG PO TABS
600.0000 mg | ORAL_TABLET | Freq: Four times a day (QID) | ORAL | Status: DC | PRN
Start: 2021-09-26 — End: 2021-10-17

## 2021-09-26 NOTE — Discharge Summary (Signed)
OBSTETRICS - DELIVERY DISCHARGE SUMMARY    09/26/2021 11:08 AM    Admission Date: 09/07/2021  Discharge Date: 09/26/21     Discharge Diagnosis:  Pre-term intrauterine pregnancy now delivered via cesarean section .    Date of Delivery: 09/23/2021    Time of Delivery:  4:36 AM    Delivered By: Jeffie Pollock T   Delivery Type: Cesarean   EDC: Estimated Date of Delivery: 12/08/21 Gestational Age: [redacted]w[redacted]d  Baby: Liveborn Female ; Apgar 1 minute: 8  Apgar 5 minute: 9 ; Birth Weight: 3 lb 1.9 oz (1415 g)   Anesthesia:   Delivery Complications: None.  Laceration:   Laceration Type: None  degree. Episiotomy: None     Placenta: , , , , Cord:      Feeding Method:      Patient Active Problem List   Diagnosis    Fall involving bed as cause of accidental injury in home as place of occurrence    Laceration of face, complex, initial encounter    Seizure disorder    Seizures    Fall against object, initial encounter    PCOS (polycystic ovarian syndrome)    Depression with anxiety    Benzodiazepine abuse    Pregnancy    Placenta previa in second trimester    Vaginal bleeding       Immunization History   Administered Date(s) Administered    Influenza Vacc Quad MDCK cell cult 4 yrs & up 03/13/2019    Influenza quad 6 MOS to 64 YRS (Flulaval/Fluarix) 02/21/2015, 04/12/2016, 05/05/2018    Influenza quadrivalent (IM) 6 months & up PRESERVED (Afluria/Fluzone) 02/22/2017       Hospital Course: Christine Andersen is a 36 y.o. female admitted to the [facility] for 2nd trimester vaginal bleeding in setting of complete placenta previa. She received 2 courses of BMZ for fetal lung maturity and magnesium sulfate for neuro protection. She was subsequently delivered via Cesarean  delivery. Her postpartum course was uncomplicated except for anemia. The patient's vital signs remained stable and the patient remained afebrile throughout her hospitalization. She was discharged to home in stable condition.    Postpartum Complications:  None.    Discharge Medications:  Current Discharge Medication List        START taking these medications    Details   acetaminophen (TYLENOL) 325 MG tablet Take 1 tablet (325 mg) by mouth every 6 (six) hours as needed for Pain      docusate sodium (COLACE) 100 MG capsule Take 2 capsules (200 mg) by mouth 2 (two) times daily as needed for Constipation      ibuprofen (ADVIL) 600 MG tablet Take 1 tablet (600 mg) by mouth every 6 (six) hours as needed for Pain      naloxone (NARCAN) 4 MG/0.1ML nasal spray 1 spray intranasally. If pt does not respond or relapses into respiratory depression call 911. Give additional doses every 2-3 min.  Qty: 2 each, Refills: 0      oxyCODONE-acetaminophen (PERCOCET) 5-325 MG per tablet Take 1-2 tablets by mouth every 6 (six) hours as needed for Pain  Qty: 20 tablet, Refills: 0           CONTINUE these medications which have NOT CHANGED    Details   ferrous sulfate 325 (65 FE) MG tablet Take 325 mg by mouth 2 (two) times daily      lamoTRIgine (LAMICTAL) 200 MG tablet Take 1 tablet (200 mg total) by mouth 2 (two) times  daily.  Qty: 60 tablet, Refills: 0      Magnesium 250 MG Tab Take 1 tablet by mouth Once a day at 6:00am      Prenatal Vit-Fe Fumarate-FA (PRENATAL VITAMINS PO) Take 1 tablet by mouth           Discharge Followup:  Unless otherwise instructed:  For this cesarean section we recommend that you followup in one (1) week for incision check at the site of your prenatal care and then also in 6 weeks for a postpartum checkup    Discharge Instructions:  Patient to follow discharge instructions as provided at the time of discharge.    Cresenciano Lick, MD

## 2021-09-26 NOTE — Plan of Care (Signed)
Problem: Vaginal/Cesarean Delivery  Goal: Postpartum management of pain/discomfort  Description: Interventions:  1. Assess pain using a consistent, developmental/age appropriate pain scale  2. Assess pain level before and following intervention  3. Include patient/patient care companion in decisions related to pain management  4. Monitor for post anesthesia issues related to pain management  5. Offer non-pharmacologic pain management interventions  6. Monitor pain infusion pumps  7. Report ineffective pain management to LIP  8. Consult/Collaborate with Anesthesia or Pain Service  Outcome: Completed  Goal: Breasts are soft with nipple integrity intact  Description: Interventions:  1. Perform breast/nipple assessment  2. Assess and manage engorgement  3. Breastfeed and/or pump breasts at least 8-12 times within 24 hours  4. Ensure proper positioning and latch  5. Provide pharmacologic and non-pharmacologic interventions as needed  Outcome: Completed  Goal: Gastrointestinal/Urinary management  Description: Interventions:   1. Use CAUTI prevention techniques  2. Maintain urine output of 30/mL per hour or greater  3. Monitor intake and output per orders  4. Adequate bladder emptying per phase of care  5. Offer pharmacologic and non-pharmacologic GI management interventions  Outcome: Completed  Goal: Uterine management  Description: Interventions:  1. Assess fundus and notify LIP if not firm, midline, or at or below the umbilicus, or if abdomen is abnormally distended  2. Assess for hemorrhage risk using appropriate screening tool  Outcome: Completed  Goal: Perineum will be clean, dry, and intact and without discharge or hematoma  Description: Interventions:  1. Place cold pack on perineum  2. Provide pericare  3. Sitz bath PRN as ordered by LIP  4. Monitor wound/incision for signs of infections  5. Assess for hemorrhoids and provide interventions  Outcome: Completed  Goal: Incision will be clean, dry, and intact and  without discharge or hematoma  Description: Interventions:  1. Assess abdominal dressing and incision  2. Monitor incision for signs of infections  3. Assess incision for bleeding/hematoma  4. Assess incision for evidence of healing  Outcome: Completed  Goal: VTE Prevention  Description: Interventions:  1. Administer anticoagulant(s) and/or apply anti-embolism stockings/devices as ordered  2. Patient ambulating with assistance  3. Patient ambulating independently  4. Assess skin color, turgor, peripheral pulses, perfusion, and presence of edema, e.g. capillary refill  Outcome: Completed  Goal: Evidence of positive mother-baby interactions  Description: Interventions:  1. Include patient/patient care companion in decisions related to care  2. Initiate skin to skin  3. Initiate safety and falls prevention interventions  4. Assess emotional status and coping mechanisms  5. Encourage rooming in and infant feeding on demand  6. Ensure parent/caregiver provides infant care  7. Assess parent/caregiver engagement and awareness of infant cues/behavior  8. Notify LIP and case management if risk factors are identified  Outcome: Completed

## 2021-09-26 NOTE — Lactation Note (Signed)
This note was copied from a baby's chart.  Mother was not at bedside during LC rounds today. LC's will continue to follow this mother during LC rounding, which may not occur daily.  Please place a consult order or call ([phone]) if a specific or time sensitive lactation need arises.      Delila Kuklinski, RN

## 2021-09-27 ENCOUNTER — Encounter (HOSPITAL_COMMUNITY): Payer: Self-pay

## 2021-09-27 ENCOUNTER — Emergency Department
Admission: EM | Admit: 2021-09-27 | Discharge: 2021-09-27 | Disposition: A | Discharge status: Home / Self Care | Payer: 59 | Attending: Emergency Medicine | Admitting: Emergency Medicine

## 2021-09-27 DIAGNOSIS — Z4801 Encounter for change or removal of surgical wound dressing: Secondary | ICD-10-CM | POA: Insufficient documentation

## 2021-09-27 DIAGNOSIS — Z5189 Encounter for other specified aftercare: Secondary | ICD-10-CM

## 2021-09-27 NOTE — ED Nurses Note (Signed)
Patient alert and oriented x 4. Patient wound cleaned and new steri strips applied. ABD pad secured to absorb any drainage. Patient and husband educated on splinting with a pillow when changing position. Supplies given at discharge. Patient verbalized understanding of discharge orders and follow up instructions. Patient ambulated to the lobby.

## 2021-09-27 NOTE — Discharge Instructions (Signed)
Serial pictures of wound as discussed

## 2021-09-27 NOTE — ED Triage Notes (Signed)
Pt. Had a recent C-section now she has some dehiscence and some bleeding at the surgical site. C-section completed at [facility].

## 2021-09-27 NOTE — ED Provider Notes (Signed)
[facility], [facility] - Emergency Department  Emergency Department Visit Note      Chief Complaint:  Wound check    HISTORY OF PRESENT ILLNESS     Theresa Palmer, date of birth [birth year], is a 36 y.o.female who presents to the Emergency Department on 09/27/2021 with wound check. The patient reports having a C-section and had the staples removed today. The patient reports that the steri strips have fallen out and there is slight blood and yellow discharge from the wound and would like it evaluated. The patient denies fever, chills, nausea, vomiting, cough, hematuria, diarrhea, chest pain and shortness of breath.    REVIEW OF SYSTEMS     The pertinent positive and negative symptoms are as per HPI. All other systems reviewed and are negative.     PATIENT HISTORY     Past Medical History:  History reviewed. No pertinent past medical history.    Past Surgical History:  Past Surgical History:   Procedure Laterality Date   . Hx adenoidectomy     . Hx breast reduction     . Hx cesarean section     . Hx tonsillectomy         Family History:  No history of acute family illness given at this time.     Social History:  No social history given at this time.    Medications:  The patient is currently on no regular medications.    Allergies:  Allergies   Allergen Reactions   . Keppra [Levetiracetam]        PHYSICAL EXAM     Vitals:  ED Triage Vitals [09/27/21 0002]   BP (Non-Invasive) 131/82   Heart Rate (!) 108   Respiratory Rate 18   Temperature 36.6 C (97.9 F)   SpO2 97 %   Weight 97.5 kg (215 lb)   Height 1.651 m (5\' 5" )       Constitutional: The patient is alert and oriented to person, place, and time. Well-developed and well-nourished.  HENT: Atraumatic, normocephalic head. Mucous membranes moist. TM's clear, Nares unremarkable. Oropharynx shows no erythema or exudate.   Eyes: Pupils equal and round, reactive to light. No scleral icterus. Normal conjunctiva. Extraocular movements are  intact.  Neck: Supple, non-tender, no nuchal rigidity, no adenopathy.   Lungs: Clear to auscultation bilaterally. Symmetric and equal expansion. No respiratory distress or retractions.  Cardiovascular: Heart is S1-S2 regular rate and regular rhythm without murmur click or rub.  Abdomen:  Soft, non-distended. No tenderness to palpation without evidence of rebound or guarding. No pulsatile masses. No organomegaly.   Genitourinary: No CVA tenderness.  Extremities: Full range of motion, no clubbing, cyanosis, or edema. Pulses 2+, capillary refill <2 seconds.  Spine: No midline or paraspinal muscle tenderness to palpation. No step-off.   Skin: Warm and dry. No cyanosis, jaundice, rash or lesion. Two areas of sight skin dehiscence but not wound dehiscence.   Neurologic: Alert and oriented x3. Normal facial symmetry and speech, Normal upper and lower extremity strength, and grossly normal sensation.      DIAGNOSTIC STUDIES     Labs:    No results found for any visits on 09/27/21.    ED PROGRESS NOTE / [facility] records reviewed by me:  I have reviewed the nurse's notes. I have reviewed the patient's problem list and pertinent past medical records.    No orders of the defined types were placed in this  encounter.           Summary: 36 year old female presents for wound check after a C-section. The patient had the staples removed today and steri stripes placed but they have since fall off and the wound open back up with yellow discharge.      Differential Diagnosis:    1. Evaluation for infection  2. Medical screening exam  3. Wound check  4. Post operative infection      Work-Up:    0005: Initial evaluation is complete at this time. I discussed with the patient that I would evaluate the wound to further evaluate. Patient is agreeable with the treatment plan at this time.    Final Decision Making:    0015: On recheck, the patient is in no acute distress. We will reapply the steri strips and  give follow-up with OB I explained the results of the diagnostic studies.  Patient is to follow up with your OB in two days. I discussed the diagnosis, disposition, and follow-up plan. The patient understood and is in accordance with the treatment plan at this time. Patient is to return here to the Emergency Department if new or worsening symptoms appear. All of their questions have been answered to their satisfaction. The patient is in stable condition at the time of discharge.     OPIATE PRESCRIPTION       Not applicable    CORE MEASURES      Not applicable    CRITICAL CARE TIME      Not applicable     PRE-DISPOSITION VITALS      Pre-Disposition Vitals:  Filed Vitals:    09/27/21 0002   BP: 131/82   Pulse: (!) 108   Resp: 18   Temp: 36.6 C (97.9 F)   SpO2: 97%       CLINICAL IMPRESSION     Clinical Impression   Visit for wound check (Primary)     DISPOSITION/PLAN     Discharged      Follow-Up:     Your OB/GYN    Call in 1 day  for follow up      Condition at Disposition: Stable    SCRIBE [place], [place], [place] scribed for Malachy Chamber, MD.     Documentation assistance provided for Malachy Chamber, MD by Kelby Aline, [place], [place]. Information recorded by the scribe was done at my direction and has been reviewed and validated by me Malachy Chamber, MD.

## 2021-09-28 ENCOUNTER — Ambulatory Visit: Payer: Self-pay

## 2021-09-28 NOTE — Lactation Note (Signed)
This note was copied from a baby's chart.  Mother not present at infant's bedside at this time.  LC's will continue to follow this mother during rounding, which may not occur daily.  Please place phone call [phone]) if a specific or time sensitive lactation need arises.     Murriel Hopper, RNC, IBCLC

## 2021-09-29 ENCOUNTER — Ambulatory Visit: Payer: Self-pay

## 2021-09-29 NOTE — Lactation Note (Signed)
This note was copied from a baby's chart.  Mother not present at infant's bedside.  LC's will continue to follow this mother during rounding, which may not occur daily.  Please place a consult order or phone call ([phone]) if a specific or time sensitive lactation need arises.       Brettney Ficken, RNC, IBCLC

## 2021-10-02 ENCOUNTER — Other Ambulatory Visit: Payer: Self-pay

## 2021-10-02 ENCOUNTER — Ambulatory Visit: Payer: Self-pay

## 2021-10-02 NOTE — Lactation Note (Signed)
This note was copied from a baby's chart.  Contacted MOB Davon via telephone to follow up with her regarding breastfeeding.  Mom states she has been diligently pumping for her preterm son, Dan Humphreys, on average every 2-3 hours. Her newborn is currently DOL 9 and adjusted to 30.[redacted] weeks gestation.  MOB shared that her breastmilk supply has not increased and her top yield is currently 4 ml per pumping session.  As mentioned in [facility] initial lactation note on September 25, 2021 she underwent a bilateral breast reduction when 36 years of age.  Offered to schedule a time to meet with mother in person for a pumping session since she is not yet able to drive and relies on family for transportation.  Brownie replied that she wished to discontinue pumping at this time.  Discussed strategies to safely reduce pumping and prevent milk stasis/plugged ducts. Encouraged mother to call LC [phone]) for any further concerns or questions related to breastfeeding.   Praised her perseverance and hard work to provide breastmilk for her newborn.    Please place a consult order or phone call [phone]) if a specific or time sensitive lactation need arises.      Murriel Hopper, RNC, IBCLC

## 2021-10-17 ENCOUNTER — Encounter (INDEPENDENT_AMBULATORY_CARE_PROVIDER_SITE_OTHER): Payer: Self-pay | Admitting: Family Medicine

## 2021-10-17 ENCOUNTER — Ambulatory Visit (INDEPENDENT_AMBULATORY_CARE_PROVIDER_SITE_OTHER): Payer: Commercial Managed Care - POS | Admitting: Family Medicine

## 2021-10-17 VITALS — BP 128/78 | HR 103 | Temp 98.6°F | Resp 18 | Ht 65.0 in | Wt 238.2 lb

## 2021-10-17 DIAGNOSIS — Z6839 Body mass index (BMI) 39.0-39.9, adult: Secondary | ICD-10-CM

## 2021-10-17 DIAGNOSIS — E669 Obesity, unspecified: Secondary | ICD-10-CM

## 2021-10-17 DIAGNOSIS — Z0000 Encounter for general adult medical examination without abnormal findings: Secondary | ICD-10-CM

## 2021-10-17 DIAGNOSIS — Z23 Encounter for immunization: Secondary | ICD-10-CM

## 2021-10-17 DIAGNOSIS — Z1159 Encounter for screening for other viral diseases: Secondary | ICD-10-CM

## 2021-10-17 MED ORDER — AMPHETAMINE-DEXTROAMPHETAMINE 30 MG PO TABS
1.0000 | ORAL_TABLET | Freq: Two times a day (BID) | ORAL | 0 refills | Status: DC
Start: ? — End: 2021-10-17

## 2021-10-17 NOTE — Progress Notes (Signed)
Patient given Hep B (2 dose) vaccine today, VIS was given also. Patient had no issues with injection

## 2021-10-17 NOTE — Progress Notes (Signed)
Date Time: 10/17/2021 2:00 PM  Patient Name: Christine Andersen, Christine Andersen  Primary Care Physician: Alfonse Alpers, MD      History of Presenting Illness:   Christine Andersen is here today as a new patient to establish care with a PCP and for her annual physical exam.  Christine Andersen is up to date with recommended immunizations. Her last GYN was Nov 2022.  Just had C-section secondary to placenta previa.    Patient more with situational anxiety stress disorder secondary to newborn child still in the hospital being a preemie.      Review of Systems   Constitutional: Negative.    HENT: Negative.     Eyes: Negative.    Respiratory: Negative.     Cardiovascular: Negative.    Gastrointestinal: Negative.    Genitourinary: Negative.    Musculoskeletal: Negative.    Skin: Negative.    Neurological: Negative.    Endo/Heme/Allergies: Negative.    Psychiatric/Behavioral: Negative.            Past Medical History:     Past Medical History:   Diagnosis Date    ADHD (attention deficit hyperactivity disorder)     PCOS (polycystic ovarian syndrome)     Placenta previa     Seizures        Past Surgical History:     Past Surgical History:   Procedure Laterality Date    CESAREAN SECTION N/A 09/23/2021    Procedure: CESAREAN SECTION;  Surgeon: Betsy Pries, MD;  Location: [facility];  Service: Obstetrics;  Laterality: N/A;    REDUCTION MAMMAPLASTY Bilateral 2009    REVISION OF SCAR Bilateral     breast    TONSILLECTOMY         Family History:     Family History   Problem Relation Age of Onset    Hashimoto's thyroiditis Mother     COPD Father     No known problems Sister     No known problems Son        Social History:     Social History     Socioeconomic History    Marital status: Married   Tobacco Use    Smoking status: Former     Packs/day: 0.25     Years: 2.00     Pack years: 0.50     Types: Cigarettes     Quit date: 10/02/2013     Years since quitting: 8.0     Passive exposure: Past    Smokeless tobacco: Never    Vaping Use    Vaping status: Never Used   Substance and Sexual Activity    Alcohol use: Yes     Comment: socially    Drug use: No    Sexual activity: Yes     Partners: Male     Social History     Tobacco Use   Smoking Status Former    Packs/day: 0.25    Years: 2.00    Pack years: 0.50    Types: Cigarettes    Quit date: 10/02/2013    Years since quitting: 8.0    Passive exposure: Past   Smokeless Tobacco Never   Vaping Use   Vaping Status Never Used     Social History     Substance and Sexual Activity   Alcohol Use Yes    Comment: socially     Social History     Substance and Sexual Activity   Drug Use No  Allergies:     Allergies   Allergen Reactions    Keppra [Levetiracetam]      Depression, anger, suicidal thoughts       Medications:     Prior to Admission medications    Medication Sig Start Date End Date Taking? Authorizing Provider   amphetamine-dextroamphetamine (ADDERALL, 30MG ,) 30 MG tablet Take 1 tablet by mouth every 12 (twelve) hours 03/08/21  Yes [provider]   lamoTRIgine (LAMICTAL) 200 MG tablet Take 1 tablet (200 mg total) by mouth 2 (two) times daily. 09/13/14  Yes Marlou Sa, MD   acetaminophen (TYLENOL) 325 MG tablet Take 1 tablet (325 mg) by mouth every 6 (six) hours as needed for Pain 09/26/21 10/17/21  Cresenciano Lick, MD   docusate sodium (COLACE) 100 MG capsule Take 2 capsules (200 mg) by mouth 2 (two) times daily as needed for Constipation 09/26/21 10/17/21  Cresenciano Lick, MD   ferrous sulfate 325 (65 FE) MG tablet Take 325 mg by mouth 2 (two) times daily  10/17/21  [provider]   ibuprofen (ADVIL) 600 MG tablet Take 1 tablet (600 mg) by mouth every 6 (six) hours as needed for Pain 09/26/21 10/17/21  Cresenciano Lick, MD   Magnesium 250 MG Tab Take 1 tablet by mouth Once a day at 6:00am  10/17/21  [provider]   naloxone [facility]) 4 MG/0.1ML nasal spray 1 spray intranasally. If pt does not respond or relapses into respiratory depression call 911. Give additional  doses every 2-3 min. 09/26/21 10/17/21  Cresenciano Lick, MD   Prenatal Vit-Fe Fumarate-FA (PRENATAL VITAMINS PO) Take 1 tablet by mouth  10/17/21  [provider]       Vitals:    10/17/21 1338   BP: 128/78   Pulse: (!) 103   Resp: 18   Temp: 98.6 F (37 C)   SpO2: 98%       Physical Exam  Vitals reviewed.   Constitutional:       General: She is not in acute distress.     Appearance: She is well-developed. She is not diaphoretic.      Comments: Obese   HENT:      Head: Normocephalic and atraumatic.      Right Ear: Tympanic membrane, ear canal and external ear normal.      Left Ear: Tympanic membrane, ear canal and external ear normal.      Nose: Nose normal.   Eyes:      General: No scleral icterus.        Right eye: No discharge.         Left eye: No discharge.      Extraocular Movements: Extraocular movements intact.      Conjunctiva/sclera: Conjunctivae normal.      Pupils: Pupils are equal, round, and reactive to light.   Neck:      Thyroid: No thyromegaly.      Vascular: No carotid bruit or JVD.      Trachea: No tracheal deviation.      Comments: Bilateral carotids, no bruits  Cardiovascular:      Rate and Rhythm: Normal rate and regular rhythm.      Heart sounds: Normal heart sounds. No murmur heard.     Comments: PMI normal.  Pulmonary:      Effort: Pulmonary effort is normal. No respiratory distress.      Breath sounds: Normal breath sounds. No wheezing or rales.   Chest:      Chest wall:  No tenderness.   Abdominal:      General: Abdomen is flat. Bowel sounds are normal. There is no distension.      Palpations: Abdomen is soft. There is no mass.      Tenderness: There is no abdominal tenderness. There is no guarding.   Musculoskeletal:         General: No tenderness or deformity. Normal range of motion.      Cervical back: Normal range of motion and neck supple.   Lymphadenopathy:      Cervical: No cervical adenopathy.   Skin:     General: Skin is warm.      Findings: No rash.   Neurological:       General: No focal deficit present.      Mental Status: She is alert and oriented to person, place, and time.      Cranial Nerves: No cranial nerve deficit.      Deep Tendon Reflexes: Reflexes are normal and symmetric.   Psychiatric:         Mood and Affect: Mood normal.         Behavior: Behavior normal.           Assessment:     1. Annual physical exam  CBC and differential    Comprehensive metabolic panel    Lipid panel    TSH      2. Encounter for hepatitis C screening test for low risk patient  Hepatitis C (HCV) antibody, Total      3. Need for vaccination  Heplisav-B             Plan:   Counseling with regards to low caloric intake and increase physical activity with a goal to lose weight.  Low salt/fat diet.  Discussed recommended preventive health services.  Get FBW  RTC 3 months for med refill      Signed by: Alfonse Alpers, MD

## 2021-10-18 ENCOUNTER — Other Ambulatory Visit
Admission: RE | Admit: 2021-10-18 | Discharge: 2021-10-18 | Disposition: A | Discharge status: Home / Self Care | Payer: Commercial Managed Care - POS | Source: Ambulatory Visit | Attending: Family Medicine | Admitting: Family Medicine

## 2021-10-18 DIAGNOSIS — Z1159 Encounter for screening for other viral diseases: Secondary | ICD-10-CM

## 2021-10-18 DIAGNOSIS — Z0000 Encounter for general adult medical examination without abnormal findings: Secondary | ICD-10-CM

## 2021-10-18 LAB — CBC AND DIFFERENTIAL
Basophils %: 1 % (ref 0.0–3.0)
Basophils Absolute: 0.1 10*3/uL (ref 0.0–0.3)
Eosinophils %: 3.1 % (ref 0.0–7.0)
Eosinophils Absolute: 0.2 10*3/uL (ref 0.0–0.8)
Hematocrit: 37.5 % (ref 36.0–48.0)
Hemoglobin: 12.2 gm/dL (ref 12.0–16.0)
Lymphocytes Absolute: 1.4 10*3/uL (ref 0.6–5.1)
Lymphocytes: 25.1 % (ref 15.0–46.0)
MCH: 29 pg (ref 28–35)
MCHC: 33 gm/dL (ref 32–36)
MCV: 88 fL (ref 80–100)
MPV: 7.7 fL (ref 6.0–10.0)
Monocytes Absolute: 0.3 10*3/uL (ref 0.1–1.7)
Monocytes: 5.6 % (ref 3.0–15.0)
Neutrophils %: 65.2 % (ref 42.0–78.0)
Neutrophils Absolute: 3.6 10*3/uL (ref 1.7–8.6)
PLT CT: 346 10*3/uL (ref 130–440)
RBC: 4.26 10*6/uL (ref 3.80–5.00)
RDW: 14.1 % — ABNORMAL HIGH (ref 11.0–14.0)
WBC: 5.6 10*3/uL (ref 4.0–11.0)

## 2021-10-18 LAB — LIPID PANEL
Cholesterol: 213 mg/dL — ABNORMAL HIGH (ref 75–199)
Coronary Heart Disease Risk: 4.84
HDL: 44 mg/dL — ABNORMAL LOW (ref 45–65)
LDL Calculated: 132 mg/dL
Triglycerides: 185 mg/dL — ABNORMAL HIGH (ref 10–150)
VLDL: 37 (ref 0–40)

## 2021-10-18 LAB — TSH: TSH: 2.21 u[IU]/mL (ref 0.40–4.20)

## 2021-10-18 LAB — COMPREHENSIVE METABOLIC PANEL
ALT: 47 U/L (ref 0–55)
AST (SGOT): 30 U/L (ref 10–42)
Albumin/Globulin Ratio: 1.17 Ratio (ref 0.80–2.00)
Albumin: 3.5 gm/dL (ref 3.5–5.0)
Alkaline Phosphatase: 105 U/L (ref 40–145)
Anion Gap: 13.3 mMol/L (ref 7.0–18.0)
BUN / Creatinine Ratio: 8.3 Ratio — ABNORMAL LOW (ref 10.0–30.0)
BUN: 7 mg/dL (ref 7–22)
Bilirubin, Total: 0.5 mg/dL (ref 0.1–1.2)
CO2: 23 mMol/L (ref 20–30)
Calcium: 9 mg/dL (ref 8.5–10.5)
Chloride: 109 mMol/L (ref 98–110)
Creatinine: 0.84 mg/dL (ref 0.60–1.20)
EGFR: 94 mL/min/{1.73_m2} (ref 60–150)
Globulin: 3 gm/dL (ref 2.0–4.0)
Glucose: 84 mg/dL (ref 71–99)
Osmolality Calculated: 278 mOsm/kg (ref 275–300)
Potassium: 4.3 mMol/L (ref 3.5–5.3)
Protein, Total: 6.5 gm/dL (ref 6.0–8.3)
Sodium: 141 mMol/L (ref 136–147)

## 2021-10-18 LAB — HEPATITIS C ANTIBODY: Hepatitis C Antibody: NONREACTIVE

## 2021-10-23 ENCOUNTER — Telehealth (INDEPENDENT_AMBULATORY_CARE_PROVIDER_SITE_OTHER): Payer: Self-pay

## 2021-10-23 ENCOUNTER — Telehealth (INDEPENDENT_AMBULATORY_CARE_PROVIDER_SITE_OTHER): Payer: Self-pay | Admitting: Family Medicine

## 2021-10-23 NOTE — Telephone Encounter (Signed)
-----   Message from Alfonse Alpers, MD sent at 10/19/2021  6:12 PM EDT -----  Inform patient of good blood work  ----- Message -----  From: [facility]  Sent: 10/18/2021   6:51 PM EDT  To: Alfonse Alpers, MD

## 2021-10-23 NOTE — Telephone Encounter (Signed)
Left message that patient's blood work was good per Dr Junius Roads

## 2021-10-23 NOTE — Telephone Encounter (Signed)
Called pt to ask what records she is requesting from [facility], as they are stating they have none from that time frame.

## 2021-11-16 ENCOUNTER — Ambulatory Visit (INDEPENDENT_AMBULATORY_CARE_PROVIDER_SITE_OTHER): Payer: Commercial Managed Care - POS

## 2021-11-16 DIAGNOSIS — Z23 Encounter for immunization: Secondary | ICD-10-CM

## 2021-11-16 NOTE — Progress Notes (Signed)
Patient given Heplisav B vaccine today, VIS was given also. Patient had no issues with injection

## 2021-11-17 ENCOUNTER — Ambulatory Visit (INDEPENDENT_AMBULATORY_CARE_PROVIDER_SITE_OTHER): Payer: Commercial Managed Care - POS

## 2021-11-21 ENCOUNTER — Other Ambulatory Visit (INDEPENDENT_AMBULATORY_CARE_PROVIDER_SITE_OTHER): Payer: Self-pay

## 2021-11-21 ENCOUNTER — Other Ambulatory Visit (INDEPENDENT_AMBULATORY_CARE_PROVIDER_SITE_OTHER): Payer: Self-pay | Admitting: Family Medicine

## 2021-11-21 MED ORDER — AMPHETAMINE-DEXTROAMPHETAMINE 30 MG PO TABS
1.0000 | ORAL_TABLET | Freq: Two times a day (BID) | ORAL | 0 refills | Status: DC
Start: ? — End: 2021-11-21

## 2021-12-22 ENCOUNTER — Other Ambulatory Visit (INDEPENDENT_AMBULATORY_CARE_PROVIDER_SITE_OTHER): Payer: Self-pay

## 2021-12-22 MED ORDER — AMPHETAMINE-DEXTROAMPHETAMINE 30 MG PO TABS
1.0000 | ORAL_TABLET | Freq: Two times a day (BID) | ORAL | 0 refills | Status: DC
Start: ? — End: 2021-12-22

## 2022-01-17 ENCOUNTER — Encounter (INDEPENDENT_AMBULATORY_CARE_PROVIDER_SITE_OTHER): Payer: Self-pay | Admitting: Family Medicine

## 2022-01-17 ENCOUNTER — Ambulatory Visit (INDEPENDENT_AMBULATORY_CARE_PROVIDER_SITE_OTHER): Payer: Commercial Managed Care - POS | Admitting: Family Medicine

## 2022-01-17 VITALS — BP 128/70 | HR 106 | Temp 97.4°F | Resp 18 | Ht 65.0 in | Wt 231.2 lb

## 2022-01-17 DIAGNOSIS — Z6838 Body mass index (BMI) 38.0-38.9, adult: Secondary | ICD-10-CM

## 2022-01-17 DIAGNOSIS — E6609 Other obesity due to excess calories: Secondary | ICD-10-CM

## 2022-01-17 DIAGNOSIS — F902 Attention-deficit hyperactivity disorder, combined type: Secondary | ICD-10-CM

## 2022-01-17 MED ORDER — LISDEXAMFETAMINE DIMESYLATE 20 MG PO CAPS
20.0000 mg | ORAL_CAPSULE | Freq: Every morning | ORAL | 0 refills | Status: DC
Start: ? — End: 2022-01-17

## 2022-01-17 NOTE — Progress Notes (Addendum)
Subjective:    Patient ID: Christine Andersen is a 36 y.o. female.    HPI Alika comes in today for ADHD follow-up.  She delivered without any issues or pregnancy problems.  She had been taking Adderall but unfortunately Adderall just keeps her up and she is not napping during the day.  She does not have a restful sleep  because her baby  wakes up 3-4 times at night.  During the day the baby is napping but she is not able to because of the ADHD medicine.  She wants to change to Vyvanse.    With regards to her weight she also has been having difficulty in losing weight.  She claims she walks every day with her baby and still has not been losing weight.  She admits that she does not have a regular diet regimen although she is a vegetarian but still probably eats more carbohydrates such as bread    The following portions of the patient's history were reviewed and updated as appropriate: allergies, current medications, past family history, past medical history, past social history, past surgical history, and problem list.            Objective:    Physical Exam  Vitals reviewed.   Constitutional:       Appearance: She is obese.   Neck:      Vascular: No carotid bruit.   Cardiovascular:      Rate and Rhythm: Normal rate and regular rhythm.   Pulmonary:      Effort: Pulmonary effort is normal.      Breath sounds: Normal breath sounds.   Musculoskeletal:      Cervical back: Neck supple.      Right lower leg: No edema.      Left lower leg: No edema.   Neurological:      Mental Status: She is alert.   Psychiatric:         Mood and Affect: Mood normal.             Assessment:       1. Attention deficit hyperactivity disorder (ADHD), combined type    2. Class 2 obesity due to excess calories without serious comorbidity with body mass index (BMI) of 38.0 to 38.9 in adult          Plan:       Vyvanse 20 mg  QD. She will communicate with regards to efficacy and may need to increase dose.  She needs to come in here every 3 months  for office visit follow-up with her ADHD  Patient was advised to really cut back on her carb intake as well as adding more physical activity.

## 2022-02-23 ENCOUNTER — Telehealth (INDEPENDENT_AMBULATORY_CARE_PROVIDER_SITE_OTHER): Payer: Self-pay

## 2022-02-23 ENCOUNTER — Other Ambulatory Visit (INDEPENDENT_AMBULATORY_CARE_PROVIDER_SITE_OTHER): Payer: Self-pay | Admitting: Family Medicine

## 2022-02-23 MED ORDER — LISDEXAMFETAMINE DIMESYLATE 30 MG PO CAPS
30.0000 mg | ORAL_CAPSULE | Freq: Every morning | ORAL | 0 refills | Status: DC
Start: ? — End: 2022-02-23

## 2022-02-23 NOTE — Telephone Encounter (Signed)
Pt is asking for higher dose of Vyvanse. States the 20 MG is not doing anything for her and she saw that she is supposed to work her way up to possibly 70 MG.. please advise.

## 2022-02-23 NOTE — Telephone Encounter (Signed)
Pt.notified

## 2022-02-27 ENCOUNTER — Other Ambulatory Visit (INDEPENDENT_AMBULATORY_CARE_PROVIDER_SITE_OTHER): Payer: Self-pay

## 2022-02-27 MED ORDER — LISDEXAMFETAMINE DIMESYLATE 30 MG PO CAPS
30.0000 mg | ORAL_CAPSULE | Freq: Every morning | ORAL | 0 refills | Status: DC
Start: ? — End: 2022-02-27

## 2022-03-20 ENCOUNTER — Encounter (INDEPENDENT_AMBULATORY_CARE_PROVIDER_SITE_OTHER): Payer: Self-pay | Admitting: Family Medicine

## 2022-03-20 ENCOUNTER — Ambulatory Visit (INDEPENDENT_AMBULATORY_CARE_PROVIDER_SITE_OTHER): Payer: Commercial Managed Care - POS | Admitting: Family Medicine

## 2022-03-20 ENCOUNTER — Telehealth (INDEPENDENT_AMBULATORY_CARE_PROVIDER_SITE_OTHER): Payer: Self-pay

## 2022-03-20 ENCOUNTER — Other Ambulatory Visit (INDEPENDENT_AMBULATORY_CARE_PROVIDER_SITE_OTHER): Payer: Self-pay | Admitting: Family Medicine

## 2022-03-20 VITALS — BP 122/58 | HR 93 | Temp 97.5°F | Resp 16 | Ht 65.0 in | Wt 242.4 lb

## 2022-03-20 DIAGNOSIS — Z79899 Other long term (current) drug therapy: Secondary | ICD-10-CM

## 2022-03-20 DIAGNOSIS — Z6841 Body Mass Index (BMI) 40.0 and over, adult: Secondary | ICD-10-CM

## 2022-03-20 DIAGNOSIS — N912 Amenorrhea, unspecified: Secondary | ICD-10-CM

## 2022-03-20 DIAGNOSIS — Z23 Encounter for immunization: Secondary | ICD-10-CM

## 2022-03-20 MED ORDER — CONTRAVE 8-90 MG PO TB12
ORAL_TABLET | ORAL | 5 refills | Status: DC
Start: ? — End: 2022-03-20

## 2022-03-20 NOTE — Progress Notes (Signed)
Subjective:    Patient ID: Christine Andersen is a 36 y.o. female.    HPI  Colleene comes in today with concerns about her obesity.  She has tried since her baby was born 5 months ago in trying to eat healthy in fact all she has been doing is eating vegetables with some dip and has really cut back on high calorie foods.  Wants to try the injectable but its not available of yet.      She also has concerns about being pregnant.  Patient had sex about 4 days ago.  Her last menstrual cycle was September 28 up to October 2.  She has been regular since the baby was born 5 months ago.  She is asking if she can have an order for a pregnancy test if at the end of the month and she has not  had her period then.  She claims she is not ready for another baby as of yet.    The following portions of the patient's history were reviewed and updated as appropriate: allergies, current medications, past family history, past medical history, past social history, past surgical history, and problem list.            Objective:    Physical Exam  Vitals reviewed.   Constitutional:       Appearance: She is obese.   Cardiovascular:      Rate and Rhythm: Normal rate and regular rhythm.   Pulmonary:      Effort: Pulmonary effort is normal.   Psychiatric:         Mood and Affect: Mood normal.             Assessment:       1. Class 3 severe obesity due to excess calories without serious comorbidity with body mass index (BMI) of 40.0 to 44.9 in adult    2. Amenorrhea          Plan:       Discussed different options for weight loss such as Contrave but with history of his epilepsy, I do not think that would be wise to give her Contrave which has bupropion that can decrease your threshold for seizure.  Patient counseled on having a regular exercise regimen  Abstain from alcohol as it can have empty calories.

## 2022-03-20 NOTE — Telephone Encounter (Signed)
Patient called stated that the contrave causes seizures but Phentermine does not and wants to know if you will change her.       Please advise.       Sam B

## 2022-03-21 ENCOUNTER — Telehealth (INDEPENDENT_AMBULATORY_CARE_PROVIDER_SITE_OTHER): Payer: Self-pay | Admitting: Family Medicine

## 2022-03-21 NOTE — Telephone Encounter (Signed)
Call placed to patient to inform of Echocardiogram being ordered. She stated understanding.

## 2022-03-23 ENCOUNTER — Other Ambulatory Visit
Admission: RE | Admit: 2022-03-23 | Discharge: 2022-03-23 | Disposition: A | Discharge status: Home / Self Care | Payer: Commercial Managed Care - POS | Source: Ambulatory Visit | Attending: Family Medicine | Admitting: Family Medicine

## 2022-03-23 DIAGNOSIS — N912 Amenorrhea, unspecified: Secondary | ICD-10-CM

## 2022-03-23 LAB — HCG QUANTITATIVE: BHCG Quant.: <2.4 m[IU]/mL

## 2022-03-29 ENCOUNTER — Other Ambulatory Visit (INDEPENDENT_AMBULATORY_CARE_PROVIDER_SITE_OTHER): Payer: Self-pay

## 2022-03-29 ENCOUNTER — Other Ambulatory Visit (INDEPENDENT_AMBULATORY_CARE_PROVIDER_SITE_OTHER): Payer: Self-pay | Admitting: Family Medicine

## 2022-03-29 MED ORDER — LISDEXAMFETAMINE DIMESYLATE 30 MG PO CAPS
30.0000 mg | ORAL_CAPSULE | Freq: Every morning | ORAL | 0 refills | Status: DC
Start: ? — End: 2022-03-29

## 2022-03-29 NOTE — Telephone Encounter (Signed)
Patient called stated that you are suppose to increase her vyvanse this refill

## 2022-03-29 NOTE — Telephone Encounter (Signed)
She was on 20 mg last time so now she is on 30 tell her to stay on that until she comes in for an office visit

## 2022-04-30 ENCOUNTER — Ambulatory Visit (INDEPENDENT_AMBULATORY_CARE_PROVIDER_SITE_OTHER): Payer: Commercial Managed Care - POS | Admitting: Family Medicine

## 2022-06-20 ENCOUNTER — Ambulatory Visit (INDEPENDENT_AMBULATORY_CARE_PROVIDER_SITE_OTHER): Payer: Commercial Managed Care - POS | Admitting: Family Medicine

## 2022-08-27 ENCOUNTER — Other Ambulatory Visit: Payer: Self-pay

## 2022-08-27 ENCOUNTER — Encounter (INDEPENDENT_AMBULATORY_CARE_PROVIDER_SITE_OTHER): Payer: Self-pay | Admitting: Psychiatry

## 2022-08-27 ENCOUNTER — Ambulatory Visit (INDEPENDENT_AMBULATORY_CARE_PROVIDER_SITE_OTHER): Payer: 59 | Admitting: Psychiatry

## 2022-08-27 VITALS — BP 117/82 | HR 90 | Temp 97.3°F | Ht 65.0 in | Wt 252.0 lb

## 2022-08-27 DIAGNOSIS — F909 Attention-deficit hyperactivity disorder, unspecified type: Secondary | ICD-10-CM

## 2022-08-27 MED ORDER — DEXTROAMPHETAMINE-AMPHETAMINE 20 MG TABLET
20.0000 mg | ORAL_TABLET | Freq: Every day | ORAL | 0 refills | Status: DC
Start: 2022-08-27 — End: 2022-08-27

## 2022-08-27 MED ORDER — DEXTROAMPHETAMINE-AMPHETAMINE 20 MG TABLET
20.0000 mg | ORAL_TABLET | Freq: Two times a day (BID) | ORAL | 0 refills | Status: DC
Start: 2022-08-27 — End: 2022-09-26

## 2022-08-27 NOTE — H&P (Signed)
Initial Outpatient Psychiatry Evaluation    [facility]  [address]   [place], [address]  [facility]: [phone]  Fax: [phone]          ID: Theresa Palmer   DOB: 17-Oct-1985  Date of Service: 08/27/2022   MRN: [id number]  CSN: [id number]    Referring Provider: Dr Elouise Munroe    Chief Complaint(s):   Chief Complaint   Patient presents with    ADHD    Anxiety       History of Present Illness:  Theresa Palmer is a 37 year old Caucasian woman who comes in referred by her PCP for Evaluation and treatment of her ADHD. She reports that she has been diagnosed and treated for ADHD since 2009 when she was living in [place]. States that she used to be on Adderall before. States that after she got pregnant she stopped the medication, now she is currently living in the area and her PCP was not feeling comfortable with prescribing her the Adderall. States that she was getting vyvanse by her, but felt that this was making her somewhat depressed. States that while not medicated, she struggles with keeping her focus and concentration, jumping from one thing to the other, constantly misplacing things. Doing the evaluation it was obvious that the patient was very distracted, talking a lot, interrupting easily, needed to get redirected. Very fidgety at times. States that this causes her to feel somewhat anxious and overwhelmed, but denies any significant symptoms depression. No other mood symptoms reported. Denies any symptoms psychosis. Denies any thoughts of wanting to hurt herself or others.      Past Psychiatric History:   Denies any history of inpatient psychiatric treatment. As stated above, she used to see a psychiatrist in [place] back in 2009 but cannot remember the name. Has been tried on vyvanse before [place] was the Strattera. Adderall worked the best, was taking 30 mg twice day. States that she was tried on Lexapro by her PCP and she felt that this may her depressed and have no motivation, so she stopped this. Denies  any history of self injurious behavior or suicide attempts.      Substance Use History:  Denies any history of illicit drug use including cannabis. Reports social alcohol intake mostly on weekends, toward three drinks a week. CAGE reported as 0/4. Denies the use of tobacco products.      Past Medical History:  There are no problems to display for this patient.      Past Medical History:   Diagnosis Date    Anxiety     Attention deficit hyperactivity disorder (ADHD)     Epileptic seizure (CMS HCC)     PCOS (polycystic ovarian syndrome)            Past Surgical History:   Procedure Laterality Date    HX ADENOIDECTOMY      HX BREAST REDUCTION      HX CESAREAN SECTION      HX TONSILLECTOMY               Medications:  Current Outpatient Medications   Medication Sig    dextroamphetamine-amphetamine (ADDERALL) 20 mg Oral Tablet Take 1 Tablet (20 mg total) by mouth Twice daily    Ibuprofen (MOTRIN) 800 mg Oral Tablet Take 1 Tablet (800 mg total) by mouth Three times a day as needed for Pain        Allergies:  Allergies   Allergen Reactions    Keppra [Levetiracetam]  Family History:  Family Medical History:       Problem Relation (Age of Onset)    Thyroid Disease Other                Family History:  See above.        Developmental History:  Reported as adequate, no delays.      Social History:  Social History     Tobacco Use    Smoking status: Never     Passive exposure: Never    Smokeless tobacco: Never   Vaping Use    Vaping status: Never Used   Substance Use Topics    Alcohol use: Yes     Comment: occ    Drug use: Not Currently      Social History     Substance and Sexual Activity   Drug Use Not Currently     Social History     Socioeconomic History    Marital status: Married     Spouse name: Not on file    Number of children: 1    Years of education: 14    Highest education level: Some college, no degree   Occupational History    Occupation: stay at home mom   Tobacco Use    Smoking status: Never     Passive  exposure: Never    Smokeless tobacco: Never   Vaping Use    Vaping status: Never Used   Substance and Sexual Activity    Alcohol use: Yes     Comment: occ    Drug use: Not Currently    Sexual activity: Yes   Other Topics Concern    Not on file   Social History Narrative    Not on file     Social Determinants of Health     Financial Resource Strain: Not on file   Transportation Needs: Not on file   Social Connections: Not on file   Intimate Partner Violence: Not on file   Housing Stability: Not on file         Social History:  Reports finishing high school, some college and 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 He is married, has a 34 month old child. Currently not working, she is a Printmaker. Denies any history of sexual or physical abuse. Denies any legal issues.    Sexual abuse Hx: denies  Physical abuse Hx: denies  Legal Hx: denies    OBJECTIVE:  BP 117/82   Pulse 90   Temp 36.3 C (97.3 F) (Thermal Scan)   Ht 1.651 m (5\' 5" )   Wt 114 kg (252 lb)   BMI 41.93 kg/m          Physical Exam:   Appearance:  within normal limits , obese, age-appropriate, and casually dressed   Behavior:  within normal limits, cooperative, and pleasant   Cognition  grossly intact   Sensorioum  alert and oriented to person, place, time and situation   Psychomotor:  within normal limits  Speech:  normal pitch and normal volume   Mood:  anxious  Affect:  mood congruent   Thought Process:  within normal limits and goal directed   Thought Content :  preoccupied   Insight:  fair   Judgement:  fair     Suicidal Thoughts: denies  Homicidal Thoughts: denies    Labs:  Reviewed     Assessment:  ENCOUNTER DIAGNOSES     ICD-10-CM   1. ADHD (attention deficit hyperactivity disorder)  F90.9       Salicia was seen today for adhd and anxiety.    Diagnoses and all orders for this visit:    ADHD (attention deficit hyperactivity disorder)  -     Discontinue: dextroamphetamine-amphetamine (ADDERALL) 20 mg Oral Tablet; Take 1 Tablet (20 mg total)  by mouth Once a day  -     dextroamphetamine-amphetamine (ADDERALL) 20 mg Oral Tablet; Take 1 Tablet (20 mg total) by mouth Twice daily            Prognosis:  [place] insight into condition    Plan:     1.      We discussed about her current symptoms, treatment, treatment history and treatment options. Discussed about her current stressors and how she he has been coping with them and how they contribute with her ongoing symptoms. Discuss about her ADHD symptoms. Discussed with her about her Syntel depression and anxiety that could be related to her untreated ADHD Board of pharmacy was review.  2.      Start a trial of Adderall 20 mg b.i.d. to help with her ADHD symptoms.  3.      Return to clinic in 4 weeks to reevaluate symptoms and adjust medication if needed.  4.      PHQ-9 score of 18  5.      GAD-7 score of nine  6.      ADHD screen was positive. See scan  7. The patient was educated about the risk and benefit of the medication including potential side effects. The patient is aware and verbalized understanding about them.   8. The patient was educated about the need to be compliant with treatment to achieve a better therapeutic outcome.  9. Patient was counseled about the use of alcohol and the use of illicit drugs specially while using medication.  10. Patient educated about diet and exercise.       Carlean Crowl Lillard Anes, MD 08/27/2022, 15:40

## 2022-09-26 ENCOUNTER — Other Ambulatory Visit: Payer: Self-pay

## 2022-09-26 ENCOUNTER — Ambulatory Visit (INDEPENDENT_AMBULATORY_CARE_PROVIDER_SITE_OTHER): Payer: 59 | Admitting: Psychiatry

## 2022-09-26 ENCOUNTER — Encounter (INDEPENDENT_AMBULATORY_CARE_PROVIDER_SITE_OTHER): Payer: Self-pay | Admitting: Psychiatry

## 2022-09-26 VITALS — Temp 97.2°F | Ht 65.0 in | Wt 114.0 lb

## 2022-09-26 DIAGNOSIS — F909 Attention-deficit hyperactivity disorder, unspecified type: Secondary | ICD-10-CM

## 2022-09-26 DIAGNOSIS — F419 Anxiety disorder, unspecified: Secondary | ICD-10-CM

## 2022-09-26 MED ORDER — DEXTROAMPHETAMINE-AMPHETAMINE 30 MG TABLET
30.0000 mg | ORAL_TABLET | Freq: Two times a day (BID) | ORAL | 0 refills | Status: DC
Start: 2022-09-26 — End: 2022-11-07

## 2022-09-26 NOTE — Progress Notes (Signed)
[facility] PSYCHIATRY  [address] [place] [address]  Phone: [phone]  Fax: [phone]    Name: Theresa Palmer   DOB:  12-Oct-1985    Age:  37 y.o.   Sex:  female   MRN:  [id number]   CSN:     [id number]    Date of Service: 09/26/2022  Place of Service: Outpatient Office  Chief Complaint:   Chief Complaint   Patient presents with    ADHD    Anxiety       Vitals:   Temp 36.2 C (97.2 F) (Thermal Scan)   Ht 1.651 m (5\' 5" )   Wt 51.7 kg (114 lb)   BMI 18.97 kg/m         Allergies   Allergen Reactions    Keppra [Levetiracetam]            Subjective:     Theresa Palmer comes in for a follow-up appointment. She reports that she has been doing much better since starting the Adderall. Has been able to focus and concentrate better, able to start things and finished them without getting easily distracted, no started multiple projects at the same time. She also reports that her anxiety has significantly improved since starting this. Feels that still there is some room for improvement since she notices that still at times gets distracted, feels the medication works for about three hours before she needs to take the second dose. Sleep has been good, sleeping between six and eight hours a night. No other issues reported.        Objective:     Review of Systems   Constitutional:  Negative for fatigue and fever.   HENT:  Negative for congestion.    Respiratory:  Negative for cough.    Neurological:  Negative for tremors, seizures and headaches.   Psychiatric/Behavioral:  Positive for decreased concentration. Negative for hallucinations, self-injury, sleep disturbance and suicidal ideas. The patient is nervous/anxious.       Past Medical History:   Diagnosis Date    Anxiety     Attention deficit hyperactivity disorder (ADHD)     Epileptic seizure (CMS HCC)     PCOS (polycystic ovarian syndrome)      Family Medical History:       Problem Relation (Age of Onset)    Thyroid Disease Other            Social  History     Socioeconomic History    Marital status: Married    Number of children: 1    Years of education: 14    Highest education level: Some college, no degree   Occupational History    Occupation: stay at home mom   Tobacco Use    Smoking status: Never     Passive exposure: Never    Smokeless tobacco: Never   Vaping Use    Vaping status: Never Used   Substance and Sexual Activity    Alcohol use: Yes     Comment: occ    Drug use: Not Currently    Sexual activity: Yes     Current Outpatient Medications   Medication Sig    dextroamphetamine-amphetamine (ADDERALL) 30 mg Oral Tablet Take 1 Tablet (30 mg total) by mouth Twice daily Presc 1 of 2    dextroamphetamine-amphetamine (ADDERALL) 30 mg Oral Tablet Take 1 Tablet (30 mg total) by mouth Twice daily To be filled no sooner than 10/24/22. Presc 2 of 2    Ibuprofen (MOTRIN) 800  mg Oral Tablet Take 1 Tablet (800 mg total) by mouth Three times a day as needed for Pain     Physical Exam  Psychiatric:         Thought Content: Thought content does not include homicidal or suicidal ideation. Thought content does not include homicidal or suicidal plan.     Psych (det) : The patient's general attitude and appearance are normal except as noted.  Theresa Palmer has a(n) cooperative attitude.  Her attire is dressed appropriately.  Appearance is clean.  Her eye contact is fair.  The patient has a(n) normal gait.  General motor activity is calm.  Mood is anxious.  Her affect is stable.  Speech is normal.  Her perception is normal.  The patient has no hallucinations.  Thought form is conversational.  The patient has a(n) normal thought rate.  Her thought content is preoccupied.  She expresses no homicidal and no suicidal ideation. She expresses no suicidal plans and no homicidal plans. The patient has no suicidal attempts and not committed/attempted homicide.  Consciousness is alert.  She is oriented to person, place and time.  Memory intact.  Backward memory deficit span is good.  Forward  memory deficit span is good.  General knowledge is consistent with education.  She has abstract thinking .  Her intellectual ability is average.  Insight is fair.  Judgement is fair.               Assessment      Current Outpatient Medications   Medication Sig    dextroamphetamine-amphetamine (ADDERALL) 30 mg Oral Tablet Take 1 Tablet (30 mg total) by mouth Twice daily Presc 1 of 2    dextroamphetamine-amphetamine (ADDERALL) 30 mg Oral Tablet Take 1 Tablet (30 mg total) by mouth Twice daily To be filled no sooner than 10/24/22. Presc 2 of 2    Ibuprofen (MOTRIN) 800 mg Oral Tablet Take 1 Tablet (800 mg total) by mouth Three times a day as needed for Pain       Medications reviewed.  Medication compliance: yes  Medication response: fair  Side effects/ Adverse reactions: no    Theresa Palmer was seen today for adhd and anxiety.    Diagnoses and all orders for this visit:    1. ADHD (attention deficit hyperactivity disorder) (Primary)  -     dextroamphetamine-amphetamine (ADDERALL) 30 mg Oral Tablet; Take 1 Tablet (30 mg total) by mouth Twice daily Presc 1 of 2  -     dextroamphetamine-amphetamine (ADDERALL) 30 mg Oral Tablet; Take 1 Tablet (30 mg total) by mouth Twice daily To be filled no sooner than 10/24/22. Presc 2 of 2      ENCOUNTER DIAGNOSES     ICD-10-CM   1. ADHD (attention deficit hyperactivity disorder)  F90.9       Plan:      1.      We discussed about her current symptoms, treatment and treatment options. Discussed about her current stressors and how she he has been coping with them and how they contribute with her ongoing symptoms.  2.      Increase Adderall to 30 mg twice a day to help with ADHD symptoms.  3.      Return to clinic in six weeks to reevaluate symptoms and adjust medication if needed.    Patient Goals:      Goals        Eat a balanced, healthy diet      Exercise 3x per week (30  min per time)      Take medications as directed             Education:     Safety: Denies any thoughts of wanting to hurt  self or others, not safety issue identified as of this moment.   Medication Risk: The patient was educated about the risk, benefit, and alternative of the medication/treatment including potential side effects.  The patient is aware and verbalized understanding about them.   Compliance: The patient was educated about the need to be compliant with treatment to achieve a better therapeutic outcome.    Weight: The patient was educated about diet and exercise to maintain an adequate weight and minimize the risk of metabolic syndrome.   Substance use: Patient was counseled about the use of alcohol or any illicit drug especially while using medication.        Breyer Tejera Harley Alto, MD

## 2022-11-07 ENCOUNTER — Ambulatory Visit (INDEPENDENT_AMBULATORY_CARE_PROVIDER_SITE_OTHER): Payer: 59 | Admitting: Psychiatry

## 2022-11-07 ENCOUNTER — Encounter (INDEPENDENT_AMBULATORY_CARE_PROVIDER_SITE_OTHER): Payer: Self-pay | Admitting: Psychiatry

## 2022-11-07 ENCOUNTER — Other Ambulatory Visit: Payer: Self-pay

## 2022-11-07 VITALS — Temp 97.2°F | Ht 65.0 in | Wt 114.0 lb

## 2022-11-07 DIAGNOSIS — F909 Attention-deficit hyperactivity disorder, unspecified type: Secondary | ICD-10-CM

## 2022-11-07 DIAGNOSIS — Z79899 Other long term (current) drug therapy: Secondary | ICD-10-CM

## 2022-11-07 MED ORDER — DEXTROAMPHETAMINE-AMPHETAMINE 30 MG TABLET
30.0000 mg | ORAL_TABLET | Freq: Two times a day (BID) | ORAL | 0 refills | Status: DC
Start: 2022-11-07 — End: 2023-01-09

## 2022-11-07 NOTE — Progress Notes (Signed)
[facility] PSYCHIAT[facility]  [address] [place] [address]  Phone: [phone]  Fax: [phone]    Name: Theresa Palmer   DOB:  1985-08-22    Age:  37 y.o.   Sex:  female   MRN:  [id number]   CSN:     [id number]    Date of Service: 11/07/2022  Place of Service: Outpatient Office  Chief Complaint:   Chief Complaint   Patient presents with    ADHD    Depression    Anxiety       Vitals:   Temp 36.2 C (97.2 F) (Thermal Scan)   Ht 1.651 m (5\' 5" )   Wt 51.7 kg (114 lb)   BMI 18.97 kg/m         Allergies   Allergen Reactions    Keppra [Levetiracetam]            Subjective:     Theresa Palmer comes in for a follow-up appointment. She reports that she feels that she has been doing okay with of 30 mg twice day of the Adderall. Feels that has been helping with her focus and concentration. States that her depression and anxiety seems to be well-controlled. States that the only issue that she has been noticing is that she has been very tired, find herself at time sleeping mid day, so was wondering about lab work to make sure that she's not anemic. States that five from this, she feels that she has been doing well. No other issues reported.        Objective:     Review of Systems   Constitutional:  Negative for fatigue and fever.   HENT:  Negative for congestion.    Respiratory:  Negative for cough.    Neurological:  Negative for tremors, seizures and headaches.   Psychiatric/Behavioral:  Positive for decreased concentration. Negative for hallucinations, self-injury, sleep disturbance and suicidal ideas. The patient is nervous/anxious.       Past Medical History:   Diagnosis Date    Anxiety     Attention deficit hyperactivity disorder (ADHD)     Epileptic seizure (CMS HCC)     PCOS (polycystic ovarian syndrome)      Family Medical History:       Problem Relation (Age of Onset)    Thyroid Disease Other            Social History     Socioeconomic History    Marital status: Married    Number of children: 1     Years of education: 14    Highest education level: Some college, no degree   Occupational History    Occupation: stay at home mom   Tobacco Use    Smoking status: Never     Passive exposure: Never    Smokeless tobacco: Never   Vaping Use    Vaping status: Never Used   Substance and Sexual Activity    Alcohol use: Yes     Comment: occ    Drug use: Not Currently    Sexual activity: Yes     Current Outpatient Medications   Medication Sig    dextroamphetamine-amphetamine (ADDERALL) 30 mg Oral Tablet Take 1 Tablet (30 mg total) by mouth Twice daily To be filled no sooner than 12/05/22. Presc 2 of 2    dextroamphetamine-amphetamine (ADDERALL) 30 mg Oral Tablet Take 1 Tablet (30 mg total) by mouth Twice daily Presc 1 of 2     Physical Exam  Psychiatric:  Thought Content: Thought content does not include homicidal or suicidal ideation. Thought content does not include homicidal or suicidal plan.     Psych (det) : The patient's general attitude and appearance are normal except as noted.  Theresa Palmer has a(n) cooperative attitude.  Her attire is dressed appropriately.  Appearance is clean.  Her eye contact is fair.  The patient has a(n) normal gait.  General motor activity is calm.  Mood is anxious.  Her affect is stable.  Speech is normal.  Her perception is normal.  The patient has no hallucinations.  Thought form is conversational.  The patient has a(n) normal thought rate.  Her thought content is preoccupied.  She expresses no homicidal and no suicidal ideation. She expresses no suicidal plans and no homicidal plans. The patient has no suicidal attempts and not committed/attempted homicide.  Consciousness is alert.  She is oriented to person, place and time.  Memory intact.  Backward memory deficit span is good.  Forward memory deficit span is good.  General knowledge is consistent with education.  She has abstract thinking .  Her intellectual ability is average.  Insight is fair.  Judgement is fair.                Assessment      Current Outpatient Medications   Medication Sig    dextroamphetamine-amphetamine (ADDERALL) 30 mg Oral Tablet Take 1 Tablet (30 mg total) by mouth Twice daily To be filled no sooner than 12/05/22. Presc 2 of 2    dextroamphetamine-amphetamine (ADDERALL) 30 mg Oral Tablet Take 1 Tablet (30 mg total) by mouth Twice daily Presc 1 of 2       Medications reviewed.  Medication compliance: yes  Medication response: fair  Side effects/ Adverse reactions: no    Roshanda was seen today for adhd, depression and anxiety.    Diagnoses and all orders for this visit:    1. ADHD (attention deficit hyperactivity disorder) (Primary)  -     dextroamphetamine-amphetamine (ADDERALL) 30 mg Oral Tablet; Take 1 Tablet (30 mg total) by mouth Twice daily To be filled no sooner than 12/05/22. Presc 2 of 2  -     dextroamphetamine-amphetamine (ADDERALL) 30 mg Oral Tablet; Take 1 Tablet (30 mg total) by mouth Twice daily Presc 1 of 2    2. Encounter for long-term (current) use of other medications  -     CBC W/AUTO DIFF; Future  -     COMPREHENSIVE METABOLIC PNL, FASTING; Future  -     VITAMIN B12; Future  -     FOLATE; Future  -     THYROID STIMULATING HORMONE WITH FREE T4 REFLEX; Future      ENCOUNTER DIAGNOSES     ICD-10-CM   1. ADHD (attention deficit hyperactivity disorder)  F90.9   2. Encounter for long-term (current) use of other medications  Z79.899       Plan:      1.      We discussed about her current symptoms, treatment and treatment options. Discussed about her current stressors and how she he has been coping with them and how they contribute with her ongoing symptoms.  2.      Continue med as presc, no changes.  3.      Return to clinic in 8 weeks to reevaluate symptoms and adjust medication if needed.  4. Labs ordered    Patient Goals:      Goals  Eat a balanced, healthy diet      Exercise 3x per week (30 min per time)      Take medications as directed             Education:     Safety: Denies any  thoughts of wanting to hurt self or others, not safety issue identified as of this moment.   Medication Risk: The patient was educated about the risk, benefit, and alternative of the medication/treatment including potential side effects.  The patient is aware and verbalized understanding about them.   Compliance: The patient was educated about the need to be compliant with treatment to achieve a better therapeutic outcome.    Weight: The patient was educated about diet and exercise to maintain an adequate weight and minimize the risk of metabolic syndrome.   Substance use: Patient was counseled about the use of alcohol or any illicit drug especially while using medication.        Aydrien Froman Harley Alto, MD

## 2022-11-13 ENCOUNTER — Other Ambulatory Visit: Payer: 59 | Attending: Psychiatry

## 2022-11-13 ENCOUNTER — Other Ambulatory Visit: Payer: Self-pay

## 2022-11-13 DIAGNOSIS — Z79899 Other long term (current) drug therapy: Secondary | ICD-10-CM | POA: Insufficient documentation

## 2022-11-13 LAB — COMPREHENSIVE METABOLIC PNL, FASTING
ALBUMIN: 3.7 g/dL (ref 3.5–5.0)
ALKALINE PHOSPHATASE: 61 U/L (ref 40–110)
ALT (SGPT): 33 U/L — ABNORMAL HIGH (ref 8–22)
ANION GAP: 8 mmol/L (ref 4–13)
AST (SGOT): 25 U/L (ref 8–45)
BILIRUBIN TOTAL: 0.2 mg/dL — ABNORMAL LOW (ref 0.3–1.3)
BUN/CREA RATIO: 13 (ref 6–22)
BUN: 10 mg/dL (ref 8–25)
CALCIUM: 9.3 mg/dL (ref 8.6–10.2)
CHLORIDE: 108 mmol/L (ref 96–111)
CO2 TOTAL: 23 mmol/L (ref 22–30)
CREATININE: 0.76 mg/dL (ref 0.60–1.05)
ESTIMATED GFR - FEMALE: >90 mL/min/BSA (ref >=60)
GLUCOSE: 96 mg/dL (ref 70–99)
POTASSIUM: 4.2 mmol/L (ref 3.5–5.1)
PROTEIN TOTAL: 7.2 g/dL (ref 6.4–8.3)
SODIUM: 139 mmol/L (ref 136–145)

## 2022-11-13 LAB — CBC W/AUTO DIFF
BASOPHIL #: <0.1 10*3/uL (ref <=0.20)
BASOPHIL %: 1 %
EOSINOPHIL #: 0.19 10*3/uL (ref <=0.50)
EOSINOPHIL %: 3 %
HCT: 43.7 % (ref 34.8–46.0)
HGB: 14.6 g/dL (ref 11.5–16.0)
IMMATURE GRANULOCYTE #: <0.1 10*3/uL (ref <0.10)
IMMATURE GRANULOCYTE %: 0 % (ref 0.0–1.0)
LYMPHOCYTE #: 2.64 10*3/uL (ref 1.00–4.80)
LYMPHOCYTE %: 37 %
MCH: 28.9 pg (ref 26.0–32.0)
MCHC: 33.4 g/dL (ref 31.0–35.5)
MCV: 86.5 fL (ref 78.0–100.0)
MONOCYTE #: 0.43 10*3/uL (ref 0.20–1.10)
MONOCYTE %: 6 %
MPV: 10.1 fL (ref 8.7–12.5)
NEUTROPHIL #: 3.89 10*3/uL (ref 1.50–7.70)
NEUTROPHIL %: 53 %
PLATELETS: 322 10*3/uL (ref 150–400)
RBC: 5.05 10*6/uL (ref 3.85–5.22)
RDW-CV: 12.8 % (ref 11.5–15.5)
WBC: 7.2 10*3/uL (ref 3.7–11.0)

## 2022-11-13 LAB — THYROID STIMULATING HORMONE WITH FREE T4 REFLEX: TSH: 4.275 u[IU]/mL (ref 0.350–4.940)

## 2022-11-13 LAB — FOLATE: FOLATE: 13 ng/mL (ref 7.0–31.0)

## 2022-11-13 LAB — VITAMIN B12: VITAMIN B 12: 1098 pg/mL — ABNORMAL HIGH (ref 200–900)

## 2023-01-09 ENCOUNTER — Encounter (INDEPENDENT_AMBULATORY_CARE_PROVIDER_SITE_OTHER): Payer: Self-pay | Admitting: Psychiatry

## 2023-01-09 ENCOUNTER — Other Ambulatory Visit: Payer: Self-pay

## 2023-01-09 ENCOUNTER — Ambulatory Visit (INDEPENDENT_AMBULATORY_CARE_PROVIDER_SITE_OTHER): Payer: 59 | Admitting: Psychiatry

## 2023-01-09 DIAGNOSIS — F909 Attention-deficit hyperactivity disorder, unspecified type: Secondary | ICD-10-CM

## 2023-01-09 MED ORDER — DEXTROAMPHETAMINE-AMPHETAMINE 30 MG TABLET
30.0000 mg | ORAL_TABLET | Freq: Two times a day (BID) | ORAL | 0 refills | Status: DC
Start: 2023-01-09 — End: 2023-04-03

## 2023-01-09 NOTE — Progress Notes (Signed)
[facility] PSYCHIAT[facility]  [address] [place] [address]  Phone: [phone]  Fax: [phone]    Name: Theresa Palmer   DOB:  02-03-1986    Age:  37 y.o.   Sex:  female   MRN:  [id number]   CSN:     [id number]    Date of Service: 01/09/2023  Place of Service: Outpatient Office  Chief Complaint:   Chief Complaint   Patient presents with    ADHD       Vitals:   There were no vitals taken for this visit.        Allergies   Allergen Reactions    Keppra [Levetiracetam]            Subjective:     Theresa Palmer comes in for a follow-up appointment. She reports that she has been doing well. States that her mood has been stable, anxiety depression has been well-controlled. States that the current dose of the Adderall has been working well, helping with her overall mood as well as with her focus and concentration. Sleep has been good, sleeping between six and eight hours a night. No other issues reported.        Objective:     Review of Systems   Constitutional:  Negative for fatigue and fever.   HENT:  Negative for congestion.    Respiratory:  Negative for cough.    Neurological:  Negative for tremors, seizures and headaches.   Psychiatric/Behavioral:  Positive for decreased concentration. Negative for hallucinations, self-injury, sleep disturbance and suicidal ideas. The patient is nervous/anxious.       Past Medical History:   Diagnosis Date    Anxiety     Attention deficit hyperactivity disorder (ADHD)     Epileptic seizure (CMS HCC)     PCOS (polycystic ovarian syndrome)      Family Medical History:       Problem Relation (Age of Onset)    Thyroid Disease Other            Social History     Socioeconomic History    Marital status: Married    Number of children: 1    Years of education: 14    Highest education level: Some college, no degree   Occupational History    Occupation: stay at home mom   Tobacco Use    Smoking status: Never     Passive exposure: Never    Smokeless tobacco: Never   Vaping Use     Vaping status: Never Used   Substance and Sexual Activity    Alcohol use: Yes     Comment: occ    Drug use: Not Currently    Sexual activity: Yes     Current Outpatient Medications   Medication Sig    dextroamphetamine-amphetamine (ADDERALL) 30 mg Oral Tablet Take 1 Tablet (30 mg total) by mouth Twice daily Presc 1 of 3    dextroamphetamine-amphetamine (ADDERALL) 30 mg Oral Tablet Take 1 Tablet (30 mg total) by mouth Twice daily To be filled no sooner than 02/06/23. Presc 2 of 3    dextroamphetamine-amphetamine (ADDERALL) 30 mg Oral Tablet Take 1 Tablet (30 mg total) by mouth Twice daily To be filled no sooner than 03/06/23. Presc 3 of 3     Physical Exam  Psychiatric:         Thought Content: Thought content does not include homicidal or suicidal ideation. Thought content does not include homicidal or suicidal plan.  Psych (det) : The patient's general attitude and appearance are normal except as noted.  [name] has a(n) cooperative attitude.  Her attire is dressed appropriately.  Appearance is clean.  Her eye contact is fair.  The patient has a(n) normal gait.  General motor activity is calm.  Mood is anxious.  Her affect is stable.  Speech is normal.  Her perception is normal.  The patient has no hallucinations.  Thought form is conversational.  The patient has a(n) normal thought rate.  Her thought content is preoccupied.  She expresses no homicidal and no suicidal ideation. She expresses no suicidal plans and no homicidal plans. The patient has no suicidal attempts and not committed/attempted homicide.  Consciousness is alert.  She is oriented to person, place and time.  Memory intact.  Backward memory deficit span is good.  Forward memory deficit span is good.  General knowledge is consistent with education.  She has abstract thinking .  Her intellectual ability is average.  Insight is fair.  Judgement is fair.               Assessment      Current Outpatient Medications   Medication Sig     dextroamphetamine-amphetamine (ADDERALL) 30 mg Oral Tablet Take 1 Tablet (30 mg total) by mouth Twice daily Presc 1 of 3    dextroamphetamine-amphetamine (ADDERALL) 30 mg Oral Tablet Take 1 Tablet (30 mg total) by mouth Twice daily To be filled no sooner than 02/06/23. Presc 2 of 3    dextroamphetamine-amphetamine (ADDERALL) 30 mg Oral Tablet Take 1 Tablet (30 mg total) by mouth Twice daily To be filled no sooner than 03/06/23. Presc 3 of 3       Medications reviewed.  Medication compliance: yes  Medication response: fair  Side effects/ Adverse reactions: no    Theresa Palmer was seen today for adhd.    Diagnoses and all orders for this visit:    1. ADHD (attention deficit hyperactivity disorder) (Primary)  -     dextroamphetamine-amphetamine (ADDERALL) 30 mg Oral Tablet; Take 1 Tablet (30 mg total) by mouth Twice daily Presc 1 of 3  -     dextroamphetamine-amphetamine (ADDERALL) 30 mg Oral Tablet; Take 1 Tablet (30 mg total) by mouth Twice daily To be filled no sooner than 02/06/23. Presc 2 of 3  -     dextroamphetamine-amphetamine (ADDERALL) 30 mg Oral Tablet; Take 1 Tablet (30 mg total) by mouth Twice daily To be filled no sooner than 03/06/23. Presc 3 of 3      ENCOUNTER DIAGNOSES     ICD-10-CM   1. ADHD (attention deficit hyperactivity disorder)  F90.9       Plan:      1.      We discussed about her current symptoms, treatment and treatment options. Discussed about her current stressors and how she he has been coping with them and how they contribute with her ongoing symptoms.  2.      Continue med as presc, no changes.  3.      Return to clinic in 12 weeks to reevaluate symptoms and adjust medication if needed.  4. Labs reviewed    Component      Latest Ref Rng 11/13/2022  8:39 AM   WBC      3.7 - 11.0 x10^3/uL 7.2    RBC      3.85 - 5.22 x10^6/uL 5.05    HGB      11.5 - 16.0 g/dL 14.6  HCT      34.8 - 46.0 % 43.7    MCV      78.0 - 100.0 fL 86.5    MCH      26.0 - 32.0 pg 28.9    MCHC      31.0 - 35.5 g/dL 16.1    RDW-CV       09.6 - 15.5 % 12.8    PLATELET COUNT      150 - 400 x10^3/uL 322    MPV      8.7 - 12.5 fL 10.1    PMN'S      % 53.0    LYMPHOCYTES      % 37.0    MONOCYTES      % 6.0    EOSINOPHIL      % 3.0    BASOPHILS      % 1.0    PMN ABS      1.50 - 7.70 x10^3/uL 3.89    LYMPHS ABS      1.00 - 4.80 x10^3/uL 2.64    MONOS ABS      0.20 - 1.10 x10^3/uL 0.43    EOS ABS      <=0.50 x10^3/uL 0.19    BASOS ABS      <=0.20 x10^3/uL <0.10    IMMATURE GRANULOCYTE %      0.0 - 1.0 % 0.0    IMMATURE GRANULOCYTE #      <0.10 x10^3/uL <0.10    SODIUM      136 - 145 mmol/L 139    POTASSIUM      3.5 - 5.1 mmol/L 4.2    CHLORIDE      96 - 111 mmol/L 108    CARBON DIOXIDE      22 - 30 mmol/L 23    ANION GAP      4 - 13 mmol/L 8    BUN      8 - 25 mg/dL 10    CREATININE      0.45 - 1.05 mg/dL 4.09    BUN/CREAT RATIO      6 - 22  13    ALBUMIN      3.5 - 5.0 g/dL  3.7    CALCIUM      8.6 - 10.2 mg/dL 9.3    GLUCOSE      70 - 99 mg/dL 96    ALKALINE PHOSPHATASE      40 - 110 U/L 61    ALT (SGPT)      8 - 22 U/L 33 (H)    AST (SGOT)      8 - 45 U/L 25    BILIRUBIN, TOTAL      0.3 - 1.3 mg/dL 0.2 (L)    TOTAL PROTEIN      6.4 - 8.3 g/dL 7.2    ESTIMATED GFR - FEMALE      >=60 mL/min/BSA >90    VITAMIN B12      200 - 900 pg/mL 1,098 (H)    FOLATE      7.0 - 31.0 ng/mL 13.0    TSH      0.350 - 4.940 uIU/mL 4.275       Legend:  (H) High  (L) Low    Patient Goals:      Goals        Eat a balanced, healthy diet      Exercise 3x per week (30 min per time)  Take medications as directed             Education:     Safety: Denies any thoughts of wanting to hurt self or others, not safety issue identified as of this moment.   Medication Risk: The patient was educated about the risk, benefit, and alternative of the medication/treatment including potential side effects.  The patient is aware and verbalized understanding about them.   Compliance: The patient was educated about the need to be compliant with treatment to achieve a better therapeutic  outcome.    Weight: The patient was educated about diet and exercise to maintain an adequate weight and minimize the risk of metabolic syndrome.   Substance use: Patient was counseled about the use of alcohol or any illicit drug especially while using medication.        Shelva Hetzer Harley Alto, MD

## 2023-04-03 ENCOUNTER — Ambulatory Visit (INDEPENDENT_AMBULATORY_CARE_PROVIDER_SITE_OTHER): Payer: 59 | Admitting: Psychiatry

## 2023-04-03 ENCOUNTER — Other Ambulatory Visit: Payer: Self-pay

## 2023-04-03 ENCOUNTER — Encounter (INDEPENDENT_AMBULATORY_CARE_PROVIDER_SITE_OTHER): Payer: Self-pay | Admitting: Psychiatry

## 2023-04-03 VITALS — Temp 97.2°F | Ht 65.0 in | Wt 114.0 lb

## 2023-04-03 DIAGNOSIS — F909 Attention-deficit hyperactivity disorder, unspecified type: Secondary | ICD-10-CM

## 2023-04-03 MED ORDER — DEXTROAMPHETAMINE-AMPHETAMINE 30 MG TABLET
30.0000 mg | ORAL_TABLET | Freq: Two times a day (BID) | ORAL | 0 refills | Status: DC
Start: 2023-04-03 — End: 2023-07-04

## 2023-04-03 NOTE — Progress Notes (Signed)
[facility] PSYCHIATRY  [address] [place] [address]  Phone: [phone]  Fax: [phone]    Name: Theresa Palmer   DOB:  16-Jul-1985    Age:  37 y.o.   Sex:  female   MRN:  [id number]   CSN:     [id number]    Date of Service: 04/03/2023  Place of Service: Outpatient Office  Chief Complaint:   Chief Complaint   Patient presents with    ADHD    Anxiety       Vitals:   Temp 36.2 C (97.2 F) (Thermal Scan)   Ht 1.651 m (5\' 5" )   Wt 51.7 kg (114 lb)   BMI 18.97 kg/m         Allergies   Allergen Reactions    Keppra [Levetiracetam]            Subjective:     Theresa Palmer comes in for a follow-up appointment. She reports that she has been doing well with the Adderall. Continues to be able to focus and concentrate well. Mood has been stable, no significant anxiety reported. Sleeping between six and eight hours a night. Overall reports feeling at baseline.        Objective:     Review of Systems   Constitutional:  Negative for fatigue and fever.   HENT:  Negative for congestion.    Respiratory:  Negative for cough.    Neurological:  Negative for tremors, seizures and headaches.   Psychiatric/Behavioral:  Positive for decreased concentration. Negative for hallucinations, self-injury, sleep disturbance and suicidal ideas. The patient is nervous/anxious.       Past Medical History:   Diagnosis Date    Anxiety     Attention deficit hyperactivity disorder (ADHD)     Epileptic seizure (CMS HCC)     PCOS (polycystic ovarian syndrome)      Family Medical History:       Problem Relation (Age of Onset)    Thyroid Disease Other            Social History     Socioeconomic History    Marital status: Married    Number of children: 1    Years of education: 14    Highest education level: Some college, no degree   Occupational History    Occupation: stay at home mom   Tobacco Use    Smoking status: Never     Passive exposure: Never    Smokeless tobacco: Never   Vaping Use    Vaping status: Never Used   Substance  and Sexual Activity    Alcohol use: Yes     Comment: occ    Drug use: Not Currently    Sexual activity: Yes     Current Outpatient Medications   Medication Sig    dextroamphetamine-amphetamine (ADDERALL) 30 mg Oral Tablet Take 1 Tablet (30 mg total) by mouth Twice daily To be filled no sooner than 05/01/23. Presc 2 of 3    dextroamphetamine-amphetamine (ADDERALL) 30 mg Oral Tablet Take 1 Tablet (30 mg total) by mouth Twice daily To be filled no sooner than 05/29/23. Presc 3 of 3    dextroamphetamine-amphetamine (ADDERALL) 30 mg Oral Tablet Take 1 Tablet (30 mg total) by mouth Twice daily Presc 1 of 3     Physical Exam  Psychiatric:         Thought Content: Thought content does not include homicidal or suicidal ideation. Thought content does not include homicidal or suicidal plan.  Psych (det) : The patient's general attitude and appearance are normal except as noted.  Theresa Palmer has a(n) cooperative attitude.  Theresa Palmer attire is dressed appropriately.  Appearance is clean.  Theresa Palmer eye contact is fair.  The patient has a(n) normal gait.  General motor activity is calm.  Mood is anxious.  Theresa Palmer affect is stable.  Speech is normal.  Theresa Palmer perception is normal.  The patient has no hallucinations.  Thought form is conversational.  The patient has a(n) normal thought rate.  Theresa Palmer thought content is preoccupied.  She expresses no homicidal and no suicidal ideation. She expresses no suicidal plans and no homicidal plans. The patient has no suicidal attempts and not committed/attempted homicide.  Consciousness is alert.  She is oriented to person, place and time.  Memory intact.  Backward memory deficit span is good.  Forward memory deficit span is good.  General knowledge is consistent with education.  She has abstract thinking .  Theresa Palmer intellectual ability is average.  Insight is fair.  Judgement is fair.               Assessment      Current Outpatient Medications   Medication Sig    dextroamphetamine-amphetamine (ADDERALL) 30 mg Oral  Tablet Take 1 Tablet (30 mg total) by mouth Twice daily To be filled no sooner than 05/01/23. Presc 2 of 3    dextroamphetamine-amphetamine (ADDERALL) 30 mg Oral Tablet Take 1 Tablet (30 mg total) by mouth Twice daily To be filled no sooner than 05/29/23. Presc 3 of 3    dextroamphetamine-amphetamine (ADDERALL) 30 mg Oral Tablet Take 1 Tablet (30 mg total) by mouth Twice daily Presc 1 of 3       Medications reviewed.  Medication compliance: yes  Medication response: fair  Side effects/ Adverse reactions: no    Theresa Palmer was seen today for adhd and anxiety.    Diagnoses and all orders for this visit:    1. ADHD (attention deficit hyperactivity disorder) (Primary)  -     dextroamphetamine-amphetamine (ADDERALL) 30 mg Oral Tablet; Take 1 Tablet (30 mg total) by mouth Twice daily To be filled no sooner than 05/01/23. Presc 2 of 3  -     dextroamphetamine-amphetamine (ADDERALL) 30 mg Oral Tablet; Take 1 Tablet (30 mg total) by mouth Twice daily To be filled no sooner than 05/29/23. Presc 3 of 3  -     dextroamphetamine-amphetamine (ADDERALL) 30 mg Oral Tablet; Take 1 Tablet (30 mg total) by mouth Twice daily Presc 1 of 3      ENCOUNTER DIAGNOSES     ICD-10-CM   1. ADHD (attention deficit hyperactivity disorder)  F90.9       Plan:      1.      We discussed about Theresa Palmer current symptoms, treatment and treatment options. Discussed about Theresa Palmer current stressors and how she he has been coping with them and how they contribute with Theresa Palmer ongoing symptoms.  2.      Continue med as presc, no changes.  3.      Return to clinic in 12 weeks to reevaluate symptoms and adjust medication if needed.      Patient Goals:      Goals        Eat a balanced, healthy diet      Exercise 3x per week (30 min per time)      Take medications as directed             Education:  Safety: Denies any thoughts of wanting to hurt self or others, not safety issue identified as of this moment.   Medication Risk: The patient was educated about the risk, benefit,  and alternative of the medication/treatment including potential side effects.  The patient is aware and verbalized understanding about them.   Compliance: The patient was educated about the need to be compliant with treatment to achieve a better therapeutic outcome.    Weight: The patient was educated about diet and exercise to maintain an adequate weight and minimize the risk of metabolic syndrome.   Substance use: Patient was counseled about the use of alcohol or any illicit drug especially while using medication.        Stevenson Windmiller Harley Alto, MD

## 2023-07-04 ENCOUNTER — Telehealth (INDEPENDENT_AMBULATORY_CARE_PROVIDER_SITE_OTHER): Payer: 59 | Admitting: Psychiatry

## 2023-07-04 ENCOUNTER — Encounter (INDEPENDENT_AMBULATORY_CARE_PROVIDER_SITE_OTHER): Payer: Self-pay | Admitting: Psychiatry

## 2023-07-04 DIAGNOSIS — F909 Attention-deficit hyperactivity disorder, unspecified type: Secondary | ICD-10-CM

## 2023-07-04 DIAGNOSIS — F39 Unspecified mood [affective] disorder: Secondary | ICD-10-CM

## 2023-07-04 DIAGNOSIS — F419 Anxiety disorder, unspecified: Secondary | ICD-10-CM

## 2023-07-04 MED ORDER — DEXTROAMPHETAMINE-AMPHETAMINE 30 MG TABLET
30.0000 mg | ORAL_TABLET | Freq: Two times a day (BID) | ORAL | 0 refills | Status: DC
Start: 2023-07-04 — End: 2023-10-02

## 2023-07-04 NOTE — Progress Notes (Signed)
 [facility] PSYCHIATRY  [address] [place] [address]  Phone: [phone]  Fax: [phone]    Name: Theresa Palmer   DOB:  1985/12/11    Age:  38 y.o.   Sex:  female   MRN:  [id number]   CSN:     [id number]    Date of Service: 07/04/2023  Place of Service: Outpatient Office  Chief Complaint:   Chief Complaint   Patient presents with    ADHD    Anxiety     Video telehealth appointment due to COVID-19 precautions.  I personally offered the service to the patient, and obtained verbal consent to provide this service.  The patient reports that is in the state of [place].  Patient Place of Service:  Home    Theresa Coiro S Ivery Quale, MD      Vitals:   There were no vitals taken for this visit.        Allergies   Allergen Reactions    Keppra [Levetiracetam]            Subjective:     Loistine had a video telehealth follow-up appointment due to COVID-19 precautions. She reports that she continues to be doing well with the Adderall. Continues to be able to focus and concentrate well. Mood has been stable, no significant anxiety reported aside from some episode where she gets overwhelmed due to some situational stressors, but no panic attacks. Sleeping between six and eight hours a night. Overall reports feeling at baseline.        Objective:     Review of Systems   Constitutional:  Negative for fatigue and fever.   HENT:  Negative for congestion.    Respiratory:  Negative for cough.    Neurological:  Negative for tremors, seizures and headaches.   Psychiatric/Behavioral:  Positive for decreased concentration. Negative for hallucinations, self-injury, sleep disturbance and suicidal ideas. The patient is nervous/anxious.       Past Medical History:   Diagnosis Date    Anxiety     Attention deficit hyperactivity disorder (ADHD)     Epileptic seizure (CMS HCC)     PCOS (polycystic ovarian syndrome)      Family Medical History:       Problem Relation (Age of Onset)    Thyroid Disease Other             Social History     Socioeconomic History    Marital status: Married    Number of children: 1    Years of education: 14    Highest education level: Some college, no degree   Occupational History    Occupation: stay at home mom   Tobacco Use    Smoking status: Never     Passive exposure: Never    Smokeless tobacco: Never   Vaping Use    Vaping status: Never Used   Substance and Sexual Activity    Alcohol use: Yes     Comment: occ    Drug use: Not Currently    Sexual activity: Yes     Current Outpatient Medications   Medication Sig    dextroamphetamine-amphetamine (ADDERALL) 30 mg Oral Tablet Take 1 Tablet (30 mg total) by mouth Twice daily To be filled no sooner than 08/29/23. Presc 3 of 3    dextroamphetamine-amphetamine (ADDERALL) 30 mg Oral Tablet Take 1 Tablet (30 mg total) by mouth Twice daily Presc 1 of 3    dextroamphetamine-amphetamine (ADDERALL) 30 mg Oral Tablet Take  1 Tablet (30 mg total) by mouth Twice daily To be filled no sooner than 08/01/23. Presc 2 of 3     Physical Exam  Psychiatric:         Thought Content: Thought content does not include homicidal or suicidal ideation. Thought content does not include homicidal or suicidal plan.     Psych (det) : The patient's general attitude and appearance are normal except as noted.  Theresa Palmer has a(n) cooperative attitude.  Her attire is dressed appropriately.  Appearance is clean.  Her eye contact is fair.  The patient has a(n) normal gait.  General motor activity is calm.  Mood is anxious.  Her affect is stable.  Speech is normal.  Her perception is normal.  The patient has no hallucinations.  Thought form is conversational.  The patient has a(n) normal thought rate.  Her thought content is preoccupied.  She expresses no homicidal and no suicidal ideation. She expresses no suicidal plans and no homicidal plans. The patient has no suicidal attempts and not committed/attempted homicide.  Consciousness is alert.  She is oriented to person, place and time.   Memory intact.  Backward memory deficit span is good.  Forward memory deficit span is good.  General knowledge is consistent with education.  She has abstract thinking .  Her intellectual ability is average.  Insight is fair.  Judgement is fair.               Assessment      Current Outpatient Medications   Medication Sig    dextroamphetamine-amphetamine (ADDERALL) 30 mg Oral Tablet Take 1 Tablet (30 mg total) by mouth Twice daily To be filled no sooner than 08/29/23. Presc 3 of 3    dextroamphetamine-amphetamine (ADDERALL) 30 mg Oral Tablet Take 1 Tablet (30 mg total) by mouth Twice daily Presc 1 of 3    dextroamphetamine-amphetamine (ADDERALL) 30 mg Oral Tablet Take 1 Tablet (30 mg total) by mouth Twice daily To be filled no sooner than 08/01/23. Presc 2 of 3       Medications reviewed.  Medication compliance: yes  Medication response: fair  Side effects/ Adverse reactions: no    Genevia was seen today for adhd and anxiety.    Diagnoses and all orders for this visit:    1. ADHD (attention deficit hyperactivity disorder) (Primary)  -     dextroamphetamine-amphetamine (ADDERALL) 30 mg Oral Tablet; Take 1 Tablet (30 mg total) by mouth Twice daily To be filled no sooner than 08/29/23. Presc 3 of 3  -     dextroamphetamine-amphetamine (ADDERALL) 30 mg Oral Tablet; Take 1 Tablet (30 mg total) by mouth Twice daily Presc 1 of 3  -     dextroamphetamine-amphetamine (ADDERALL) 30 mg Oral Tablet; Take 1 Tablet (30 mg total) by mouth Twice daily To be filled no sooner than 08/01/23. Presc 2 of 3    2. Anxiety    3. Mood disorder (CMS HCC)      ENCOUNTER DIAGNOSES     ICD-10-CM   1. ADHD (attention deficit hyperactivity disorder)  F90.9   2. Anxiety  F41.9   3. Mood disorder (CMS HCC)  F39       Plan:      1.      We discussed about her current symptoms, treatment and treatment options. Discussed about her current stressors and how she he has been coping with them and how they contribute with her ongoing symptoms.  2.  Continue  med as presc, no changes.  3.      Return to clinic in 12 weeks to reevaluate symptoms and adjust medication if needed.      Patient Goals:      Goals        Eat a balanced, healthy diet      Exercise 3x per week (30 min per time)      Take medications as directed             Education:     Safety: Denies any thoughts of wanting to hurt self or others, not safety issue identified as of this moment.   Medication Risk: The patient was educated about the risk, benefit, and alternative of the medication/treatment including potential side effects.  The patient is aware and verbalized understanding about them.   Compliance: The patient was educated about the need to be compliant with treatment to achieve a better therapeutic outcome.    Weight: The patient was educated about diet and exercise to maintain an adequate weight and minimize the risk of metabolic syndrome.   Substance use: Patient was counseled about the use of alcohol or any illicit drug especially while using medication.        Wilfrid Hyser Harley Alto, MD

## 2023-08-13 ENCOUNTER — Encounter (INDEPENDENT_AMBULATORY_CARE_PROVIDER_SITE_OTHER): Payer: Self-pay | Admitting: Psychiatry

## 2023-10-02 ENCOUNTER — Other Ambulatory Visit: Payer: Self-pay

## 2023-10-02 ENCOUNTER — Encounter (INDEPENDENT_AMBULATORY_CARE_PROVIDER_SITE_OTHER): Payer: Self-pay | Admitting: Psychiatry

## 2023-10-02 ENCOUNTER — Telehealth (INDEPENDENT_AMBULATORY_CARE_PROVIDER_SITE_OTHER): Payer: Self-pay | Admitting: Psychiatry

## 2023-10-02 DIAGNOSIS — F419 Anxiety disorder, unspecified: Secondary | ICD-10-CM

## 2023-10-02 DIAGNOSIS — F39 Unspecified mood [affective] disorder: Secondary | ICD-10-CM

## 2023-10-02 DIAGNOSIS — F909 Attention-deficit hyperactivity disorder, unspecified type: Secondary | ICD-10-CM

## 2023-10-02 MED ORDER — DEXTROAMPHETAMINE-AMPHETAMINE 30 MG TABLET
30.0000 mg | ORAL_TABLET | Freq: Two times a day (BID) | ORAL | 0 refills | Status: DC
Start: 2023-10-02 — End: 2023-12-26

## 2023-10-02 NOTE — Progress Notes (Signed)
 [facility] PSYCHIATRY  [address] [place] [address]  Phone: [phone]  Fax: [phone]    Name: Theresa Palmer   DOB:  26-Jan-1986    Age:  38 y.o.   Sex:  female   MRN:  [id number]   CSN:     [id number]    Date of Service: 10/02/2023  Place of Service: Outpatient Office  Chief Complaint:   Chief Complaint   Patient presents with    ADHD    Unstable Mood     Video telehealth appointment due to COVID-19 precautions.  I personally offered the service to the patient, and obtained verbal consent to provide this service.  The patient reports that is in the state of [place] .  Patient Place of Service:  Home    Rahmah Mccamy S Colan Dash, MD      Vitals:   There were no vitals taken for this visit.        Allergies   Allergen Reactions    Keppra [Levetiracetam]            Subjective:     Loetta Ringer had a video telehealth follow-up appointment. She reports that she has been doing good with the Adderall. Continues to be able to focus and concentrate well. Mood has been stable, no significant anxiety reported aside from some episode where she gets overwhelmed due to some situational stressors, but no panic attacks. Sleeping between six and eight hours a night. Overall reports feeling at baseline.        Objective:     Review of Systems   Constitutional:  Negative for fatigue and fever.   HENT:  Negative for congestion.    Respiratory:  Negative for cough.    Neurological:  Negative for tremors, seizures and headaches.   Psychiatric/Behavioral:  Positive for decreased concentration. Negative for hallucinations, self-injury, sleep disturbance and suicidal ideas. The patient is nervous/anxious.       Past Medical History:   Diagnosis Date    Anxiety     Attention deficit hyperactivity disorder (ADHD)     Epileptic seizure (CMS HCC)     PCOS (polycystic ovarian syndrome)      Family Medical History:       Problem Relation (Age of Onset)    Thyroid  Disease Other            Social History      Socioeconomic History    Marital status: Married    Number of children: 1    Years of education: 14    Highest education level: Some college, no degree   Occupational History    Occupation: stay at home mom   Tobacco Use    Smoking status: Never     Passive exposure: Never    Smokeless tobacco: Never   Vaping Use    Vaping status: Never Used   Substance and Sexual Activity    Alcohol use: Yes     Comment: occ    Drug use: Not Currently    Sexual activity: Yes     Current Outpatient Medications   Medication Sig    dextroamphetamine -amphetamine  (ADDERALL) 30 mg Oral Tablet Take 1 Tablet (30 mg total) by mouth Twice daily Presc 1 of 3    dextroamphetamine -amphetamine  (ADDERALL) 30 mg Oral Tablet Take 1 Tablet (30 mg total) by mouth Twice daily To be filled no sooner than 10/30/23. Presc 2 of 3    dextroamphetamine -amphetamine  (ADDERALL) 30 mg Oral Tablet Take 1 Tablet (30 mg  total) by mouth Twice daily To be filled no sooner than 11/27/23. Presc 3 of 3     Physical Exam  Psychiatric:         Thought Content: Thought content does not include homicidal or suicidal ideation. Thought content does not include homicidal or suicidal plan.     Psych (det) : The patient's general attitude and appearance are normal except as noted.  Augustina has a(n) cooperative attitude.  Her attire is dressed appropriately.  Appearance is clean.  Her eye contact is fair.  The patient has a(n) normal gait.  General motor activity is calm.  Mood is anxious.  Her affect is stable.  Speech is normal.  Her perception is normal.  The patient has no hallucinations.  Thought form is conversational.  The patient has a(n) normal thought rate.  Her thought content is preoccupied.  She expresses no homicidal and no suicidal ideation. She expresses no suicidal plans and no homicidal plans. The patient has no suicidal attempts and not committed/attempted homicide.  Consciousness is alert.  She is oriented to person, place and time.  Memory intact.  Backward  memory deficit span is good.  Forward memory deficit span is good.  General knowledge is consistent with education.  She has abstract thinking .  Her intellectual ability is average.  Insight is fair.  Judgement is fair.               Assessment      Current Outpatient Medications   Medication Sig    dextroamphetamine -amphetamine  (ADDERALL) 30 mg Oral Tablet Take 1 Tablet (30 mg total) by mouth Twice daily Presc 1 of 3    dextroamphetamine -amphetamine  (ADDERALL) 30 mg Oral Tablet Take 1 Tablet (30 mg total) by mouth Twice daily To be filled no sooner than 10/30/23. Presc 2 of 3    dextroamphetamine -amphetamine  (ADDERALL) 30 mg Oral Tablet Take 1 Tablet (30 mg total) by mouth Twice daily To be filled no sooner than 11/27/23. Presc 3 of 3       Medications reviewed.  Medication compliance: yes  Medication response: fair  Side effects/ Adverse reactions: no    Kang was seen today for adhd and unstable mood.    Diagnoses and all orders for this visit:    1. ADHD (attention deficit hyperactivity disorder) (Primary)  -     dextroamphetamine -amphetamine  (ADDERALL) 30 mg Oral Tablet; Take 1 Tablet (30 mg total) by mouth Twice daily Presc 1 of 3  -     dextroamphetamine -amphetamine  (ADDERALL) 30 mg Oral Tablet; Take 1 Tablet (30 mg total) by mouth Twice daily To be filled no sooner than 10/30/23. Presc 2 of 3  -     dextroamphetamine -amphetamine  (ADDERALL) 30 mg Oral Tablet; Take 1 Tablet (30 mg total) by mouth Twice daily To be filled no sooner than 11/27/23. Presc 3 of 3    2. Mood disorder (CMS HCC)    3. Anxiety      ENCOUNTER DIAGNOSES     ICD-10-CM   1. ADHD (attention deficit hyperactivity disorder)  F90.9   2. Mood disorder (CMS HCC)  F39   3. Anxiety  F41.9       Plan:      1.      We discussed about her current symptoms, treatment and treatment options. Discussed about her current stressors and how she he has been coping with them and how they contribute with her ongoing symptoms.  2.      Continue med as  presc, no  changes.  3.      Return to clinic in 12 weeks to reevaluate symptoms and adjust medication if needed.      Patient Goals:      Goals        Eat a balanced, healthy diet      Exercise 3x per week (30 min per time)      Take medications as directed             Education:     Safety: Denies any thoughts of wanting to hurt self or others, not safety issue identified as of this moment.   Medication Risk: The patient was educated about the risk, benefit, and alternative of the medication/treatment including potential side effects.  The patient is aware and verbalized understanding about them.   Compliance: The patient was educated about the need to be compliant with treatment to achieve a better therapeutic outcome.    Weight: The patient was educated about diet and exercise to maintain an adequate weight and minimize the risk of metabolic syndrome.   Substance use: Patient was counseled about the use of alcohol or any illicit drug especially while using medication.        Nickie Deren Nellene Banana, MD

## 2023-12-25 ENCOUNTER — Other Ambulatory Visit: Payer: Self-pay

## 2023-12-26 ENCOUNTER — Ambulatory Visit (INDEPENDENT_AMBULATORY_CARE_PROVIDER_SITE_OTHER): Payer: Self-pay | Admitting: Psychiatry

## 2023-12-26 ENCOUNTER — Encounter (INDEPENDENT_AMBULATORY_CARE_PROVIDER_SITE_OTHER): Payer: Self-pay | Admitting: Psychiatry

## 2023-12-26 VITALS — Temp 97.3°F | Ht 65.0 in | Wt 114.0 lb

## 2023-12-26 DIAGNOSIS — F419 Anxiety disorder, unspecified: Secondary | ICD-10-CM

## 2023-12-26 DIAGNOSIS — F39 Unspecified mood [affective] disorder: Secondary | ICD-10-CM

## 2023-12-26 DIAGNOSIS — F909 Attention-deficit hyperactivity disorder, unspecified type: Secondary | ICD-10-CM

## 2023-12-26 MED ORDER — DEXTROAMPHETAMINE-AMPHETAMINE 30 MG TABLET
30.0000 mg | ORAL_TABLET | Freq: Two times a day (BID) | ORAL | 0 refills | Status: DC
Start: 2023-12-26 — End: 2023-12-26

## 2023-12-26 MED ORDER — METHYLPHENIDATE ER 36 MG TABLET,EXTENDED RELEASE 24 HR
36.0000 mg | EXTENDED_RELEASE_TABLET | Freq: Every morning | ORAL | 0 refills | Status: DC
Start: 2023-12-26 — End: 2024-01-29

## 2023-12-26 MED ORDER — BUSPIRONE 10 MG TABLET
10.0000 mg | ORAL_TABLET | Freq: Two times a day (BID) | ORAL | 1 refills | Status: DC
Start: 2023-12-26 — End: 2024-01-29

## 2023-12-26 NOTE — Progress Notes (Signed)
 [facility]CHIATRY  [address] [place] [address]  Phone: [phone]  Fax: [phone]    Name: Theresa Palmer   DOB:  02/04/1986    Age:  38 y.o.   Sex:  female   MRN:  [id number]   CSN:     [id number]    Date of Service: 12/26/2023  Place of Service: Outpatient Office  Chief Complaint:   Chief Complaint   Patient presents with    ADHD    Anxiety       Vitals:   Temp 36.3 C (97.3 F) (Thermal Scan)   Ht 1.651 m (5' 5)   Wt 51.7 kg (114 lb)   BMI 18.97 kg/m         Allergies   Allergen Reactions    Keppra [Levetiracetam]            Subjective:     Theresa Palmer comes in for a follow-up appointment. She reports that she stopped taking the Adderall for a couple weeks due to feeling that was not helping much, still having some issues with her focus and concentration, also feeling somewhat anxious. She does report that she notice that while off the medication her focus and concentration, worse, was not able to keep up with her task, was spending more money than usual due to constantly buying things to start new projects and never finish them, so she started noticing that she really needed to go back on something to help with her ADHD, but once a try something different. Stated she has been getting very overwhelmed. Sleep has been fair, sleeping between six and eight hours a night. No other issues reported.        Objective:     Review of Systems   Constitutional:  Negative for fatigue and fever.   HENT:  Negative for congestion.    Respiratory:  Negative for cough.    Neurological:  Negative for tremors, seizures and headaches.   Psychiatric/Behavioral:  Positive for decreased concentration. Negative for hallucinations, self-injury, sleep disturbance and suicidal ideas. The patient is nervous/anxious.       Past Medical History:   Diagnosis Date    Anxiety     Attention deficit hyperactivity disorder (ADHD)     Epileptic seizure (CMS HCC)     PCOS (polycystic ovarian syndrome)      Family  Medical History:       Problem Relation (Age of Onset)    Thyroid  Disease Other            Social History     Socioeconomic History    Marital status: Married    Number of children: 1    Years of education: 14    Highest education level: Some college, no degree   Occupational History    Occupation: stay at home mom   Tobacco Use    Smoking status: Never     Passive exposure: Never    Smokeless tobacco: Never   Vaping Use    Vaping status: Never Used   Substance and Sexual Activity    Alcohol use: Yes     Comment: occ    Drug use: Not Currently    Sexual activity: Yes     Current Outpatient Medications   Medication Sig    dextroamphetamine -amphetamine  (ADDERALL) 30 mg Oral Tablet Take 1 Tablet (30 mg total) by mouth Twice daily Presc 1 of 3    dextroamphetamine -amphetamine  (ADDERALL) 30 mg Oral Tablet Take 1 Tablet (30 mg  total) by mouth Twice daily To be filled no sooner than 10/30/23. Presc 2 of 3    dextroamphetamine -amphetamine  (ADDERALL) 30 mg Oral Tablet Take 1 Tablet (30 mg total) by mouth Twice daily To be filled no sooner than 11/27/23. Presc 3 of 3     Physical Exam  Psychiatric:         Thought Content: Thought content does not include homicidal or suicidal ideation. Thought content does not include homicidal or suicidal plan.     Psych (det) : The patient's general attitude and appearance are normal except as noted.  Theresa Palmer has a(n) cooperative attitude.  Her attire is dressed appropriately.  Appearance is clean.  Her eye contact is fair.  The patient has a(n) normal gait.  General motor activity is calm.  Mood is anxious.  Her affect is stable.  Speech is normal.  Her perception is normal.  The patient has no hallucinations.  Thought form is conversational.  The patient has a(n) normal thought rate.  Her thought content is preoccupied.  She expresses no homicidal and no suicidal ideation. She expresses no suicidal plans and no homicidal plans. The patient has no suicidal attempts and not committed/attempted  homicide.  Consciousness is alert.  She is oriented to person, place and time.  Memory intact.  Backward memory deficit span is good.  Forward memory deficit span is good.  General knowledge is consistent with education.  She has abstract thinking .  Her intellectual ability is average.  Insight is fair.  Judgement is fair.               Assessment      Current Outpatient Medications   Medication Sig    dextroamphetamine -amphetamine  (ADDERALL) 30 mg Oral Tablet Take 1 Tablet (30 mg total) by mouth Twice daily Presc 1 of 3    dextroamphetamine -amphetamine  (ADDERALL) 30 mg Oral Tablet Take 1 Tablet (30 mg total) by mouth Twice daily To be filled no sooner than 10/30/23. Presc 2 of 3    dextroamphetamine -amphetamine  (ADDERALL) 30 mg Oral Tablet Take 1 Tablet (30 mg total) by mouth Twice daily To be filled no sooner than 11/27/23. Presc 3 of 3       Medications reviewed.  Medication compliance: yes  Medication response: fair  Side effects/ Adverse reactions: no    Theresa Palmer was seen today for adhd and anxiety.    Diagnoses and all orders for this visit:    1. ADHD (attention deficit hyperactivity disorder) (Primary)  -     Discontinue: dextroamphetamine -amphetamine  (ADDERALL) 30 mg Oral Tablet; Take 1 Tablet (30 mg total) by mouth Twice daily To be filled no sooner than 01/23/24. Presc 2 of 3  -     Discontinue: dextroamphetamine -amphetamine  (ADDERALL) 30 mg Oral Tablet; Take 1 Tablet (30 mg total) by mouth Twice daily To be filled no sooner than 02/20/24. Presc 3 of 3  -     Discontinue: dextroamphetamine -amphetamine  (ADDERALL) 30 mg Oral Tablet; Take 1 Tablet (30 mg total) by mouth Twice daily Presc 1 of 3  -     methylphenidate  HCl (CONCERTA ) 36 mg Oral Tablet Extended Rel 24 hr; Take 1 Tablet (36 mg total) by mouth Every morning    2. Mood disorder (CMS HCC)    3. Anxiety  -     methylphenidate  HCl (CONCERTA ) 36 mg Oral Tablet Extended Rel 24 hr; Take 1 Tablet (36 mg total) by mouth Every morning  -     busPIRone  (BUSPAR )  10 mg  Oral Tablet; Take 1 Tablet (10 mg total) by mouth Twice daily      ENCOUNTER DIAGNOSES     ICD-10-CM   1. ADHD (attention deficit hyperactivity disorder)  F90.9   2. Mood disorder (CMS HCC)  F39   3. Anxiety  F41.9       Plan:      1.      We discussed about her current symptoms, treatment and treatment options. Discussed about her current stressors and how she he has been coping with them and how they contribute with her ongoing symptoms.  2.      Adderall discontinued and a trial of concerta  36mg  a day started to help with ADHD symptoms.  3. Start a trial of Buspar  10 mg bid to help with anxiety.  4.      Return to clinic in 4 weeks to reevaluate symptoms and adjust medication if needed.      Patient Goals:      Goals        Eat a balanced, healthy diet      Exercise 3x per week (30 min per time)      Take medications as directed             Education:     Safety: Denies any thoughts of wanting to hurt self or others, not safety issue identified as of this moment.   Medication Risk: The patient was educated about the risk, benefit, and alternative of the medication/treatment including potential side effects.  The patient is aware and verbalized understanding about them.   Compliance: The patient was educated about the need to be compliant with treatment to achieve a better therapeutic outcome.    Weight: The patient was educated about diet and exercise to maintain an adequate weight and minimize the risk of metabolic syndrome.   Substance use: Patient was counseled about the use of alcohol or any illicit drug especially while using medication.        Taylin Leder GORMAN Jackson Southerly, MD

## 2024-01-01 ENCOUNTER — Telehealth (INDEPENDENT_AMBULATORY_CARE_PROVIDER_SITE_OTHER): Payer: Self-pay | Admitting: Psychiatry

## 2024-01-01 NOTE — Telephone Encounter (Signed)
 Patient called and stated that the new anxiety medication she was trying made her very sick and made her have an anxiety attack.  She did go to the ER, but she is wondering if there is another medication that she could try.  Pt was advised that you are out for vacation and to see if their PCP could help her in any way.  Pt verbalized understanding.

## 2024-01-07 NOTE — Telephone Encounter (Signed)
 LM for pt to call office to schedule sooner appt for possible med change.  Duwaine Dibbles, MA

## 2024-01-29 ENCOUNTER — Encounter (INDEPENDENT_AMBULATORY_CARE_PROVIDER_SITE_OTHER): Payer: Self-pay | Admitting: Psychiatry

## 2024-01-29 ENCOUNTER — Ambulatory Visit (INDEPENDENT_AMBULATORY_CARE_PROVIDER_SITE_OTHER): Payer: Self-pay | Admitting: Psychiatry

## 2024-01-29 ENCOUNTER — Other Ambulatory Visit: Payer: Self-pay

## 2024-01-29 VITALS — Temp 98.3°F | Ht 65.0 in | Wt 114.0 lb

## 2024-01-29 DIAGNOSIS — F419 Anxiety disorder, unspecified: Secondary | ICD-10-CM | POA: Insufficient documentation

## 2024-01-29 DIAGNOSIS — F39 Unspecified mood [affective] disorder: Secondary | ICD-10-CM | POA: Insufficient documentation

## 2024-01-29 DIAGNOSIS — F909 Attention-deficit hyperactivity disorder, unspecified type: Secondary | ICD-10-CM | POA: Insufficient documentation

## 2024-01-29 MED ORDER — BUSPIRONE 10 MG TABLET
10.0000 mg | ORAL_TABLET | Freq: Two times a day (BID) | ORAL | 1 refills | Status: DC
Start: 2024-01-29 — End: 2024-02-26

## 2024-01-29 MED ORDER — DEXTROAMPHETAMINE-AMPHETAMINE ER 37.5 MG CAPSULE, 3 BEAD, EXT REL 24HR
1.0000 | EXTENDED_RELEASE_CAPSULE | Freq: Every day | ORAL | 0 refills | Status: DC
Start: 2024-01-29 — End: 2024-02-26

## 2024-01-29 NOTE — Progress Notes (Signed)
 [facility]  [address] [place] [address]  Phone: [phone]  Fax: [phone]    Name: Theresa Palmer   DOB:  11/19/1985    Age:  38 y.o.   Sex:  female   MRN:  [id number]   CSN:     [id number]    Date of Service: 01/29/2024  Place of Service: Outpatient Office  Chief Complaint:   Chief Complaint   Patient presents with    ADHD    Anxiety       Vitals:   Temp 36.8 C (98.3 F) (Thermal Scan)   Ht 1.651 m (5' 5)   Wt 51.7 kg (114 lb)   BMI 18.97 kg/m         Allergies   Allergen Reactions    Keppra [Levetiracetam]            Subjective:     Theresa Palmer comes in for a follow-up appointment. Sates that did not like the way the concerta  made her feel, was feeling a bit more depressed, feels that the adderall was working better. States that off the medication her ADHD symptoms are poorly controlled and interfere with her day to day activity which increases her anxiety, but the Buspar  has been helping with this. Sleeping 6-8 hours a night.        Objective:     Review of Systems   Constitutional:  Negative for fatigue and fever.   HENT:  Negative for congestion.    Respiratory:  Negative for cough.    Neurological:  Negative for tremors, seizures and headaches.   Psychiatric/Behavioral:  Positive for decreased concentration. Negative for hallucinations, self-injury, sleep disturbance and suicidal ideas. The patient is nervous/anxious.       Past Medical History:   Diagnosis Date    Anxiety     Attention deficit hyperactivity disorder (ADHD)     Epileptic seizure (CMS HCC)     PCOS (polycystic ovarian syndrome)      Family Medical History:       Problem Relation (Age of Onset)    Thyroid  Disease Other            Social History     Socioeconomic History    Marital status: Married    Number of children: 1    Years of education: 14    Highest education level: Some college, no degree   Occupational History    Occupation: stay at home mom   Tobacco Use    Smoking status: Never     Passive  exposure: Never    Smokeless tobacco: Never   Vaping Use    Vaping status: Never Used   Substance and Sexual Activity    Alcohol use: Yes     Comment: occ    Drug use: Not Currently    Sexual activity: Yes     Current Outpatient Medications   Medication Sig    busPIRone  (BUSPAR ) 10 mg Oral Tablet Take 1 Tablet (10 mg total) by mouth Twice daily    dextroamphetamine -amphetamine  (MYDAYIS) 37.5 mg Oral capsule, ER triphasic 24 hr Take 1 Capsule by mouth Daily     Physical Exam  Psychiatric:         Thought Content: Thought content does not include homicidal or suicidal ideation. Thought content does not include homicidal or suicidal plan.     Psych (det) : The patient's general attitude and appearance are normal except as noted.  Theresa Palmer has a(n) cooperative attitude.  Her  attire is dressed appropriately.  Appearance is clean.  Her eye contact is fair.  The patient has a(n) normal gait.  General motor activity is calm.  Mood is anxious.  Her affect is stable.  Speech is normal.  Her perception is normal.  The patient has no hallucinations.  Thought form is conversational.  The patient has a(n) normal thought rate.  Her thought content is preoccupied.  She expresses no homicidal and no suicidal ideation. She expresses no suicidal plans and no homicidal plans. The patient has no suicidal attempts and not committed/attempted homicide.  Consciousness is alert.  She is oriented to person, place and time.  Memory intact.  Backward memory deficit span is good.  Forward memory deficit span is good.  General knowledge is consistent with education.  She has abstract thinking .  Her intellectual ability is average.  Insight is fair.  Judgement is fair.               Assessment      Current Outpatient Medications   Medication Sig    busPIRone  (BUSPAR ) 10 mg Oral Tablet Take 1 Tablet (10 mg total) by mouth Twice daily    dextroamphetamine -amphetamine  (MYDAYIS) 37.5 mg Oral capsule, ER triphasic 24 hr Take 1 Capsule by mouth Daily        Medications reviewed.  Medication compliance: yes  Medication response: fair  Side effects/ Adverse reactions: no    Theresa Palmer was seen today for adhd and anxiety.    Diagnoses and all orders for this visit:    1. ADHD (attention deficit hyperactivity disorder) (Primary)  -     dextroamphetamine -amphetamine  (MYDAYIS) 37.5 mg Oral capsule, ER triphasic 24 hr; Take 1 Capsule by mouth Daily    2. Anxiety  -     busPIRone  (BUSPAR ) 10 mg Oral Tablet; Take 1 Tablet (10 mg total) by mouth Twice daily    3. Mood disorder (CMS HCC)      ENCOUNTER DIAGNOSES     ICD-10-CM   1. ADHD (attention deficit hyperactivity disorder)  F90.9   2. Anxiety  F41.9   3. Mood disorder (CMS HCC)  F39       Plan:      1.      We discussed about her current symptoms, treatment and treatment options. Discussed about her current stressors and how she he has been coping with them and how they contribute with her ongoing symptoms.  2.      Concerta  discontinued and a trial of mydayis 37.5mg  a day started to help with ADHD.  3. Continue rest of med as presc.  4.      Return to clinic in 4 weeks to reevaluate symptoms and adjust medication if needed.      Patient Goals:      Goals        Eat a balanced, healthy diet      Exercise 3x per week (30 min per time)      Take medications as directed             Education:     Safety: Denies any thoughts of wanting to hurt self or others, not safety issue identified as of this moment.   Medication Risk: The patient was educated about the risk, benefit, and alternative of the medication/treatment including potential side effects.  The patient is aware and verbalized understanding about them.   Compliance: The patient was educated about the need to be compliant with treatment to achieve  a better therapeutic outcome.    Weight: The patient was educated about diet and exercise to maintain an adequate weight and minimize the risk of metabolic syndrome.   Substance use: Patient was counseled about the use of  alcohol or any illicit drug especially while using medication.        Theresa Palmer GORMAN Jackson Southerly, MD

## 2024-02-25 ENCOUNTER — Other Ambulatory Visit: Payer: Self-pay

## 2024-02-26 ENCOUNTER — Ambulatory Visit (INDEPENDENT_AMBULATORY_CARE_PROVIDER_SITE_OTHER): Admitting: Psychiatry

## 2024-02-26 ENCOUNTER — Encounter (INDEPENDENT_AMBULATORY_CARE_PROVIDER_SITE_OTHER): Payer: Self-pay | Admitting: Psychiatry

## 2024-02-26 VITALS — Temp 98.3°F | Ht 65.0 in | Wt 114.0 lb

## 2024-02-26 DIAGNOSIS — F419 Anxiety disorder, unspecified: Secondary | ICD-10-CM

## 2024-02-26 DIAGNOSIS — F909 Attention-deficit hyperactivity disorder, unspecified type: Secondary | ICD-10-CM

## 2024-02-26 DIAGNOSIS — F39 Unspecified mood [affective] disorder: Secondary | ICD-10-CM

## 2024-02-26 MED ORDER — BUSPIRONE 10 MG TABLET
10.0000 mg | ORAL_TABLET | Freq: Two times a day (BID) | ORAL | 1 refills | Status: DC
Start: 2024-02-26 — End: 2024-03-19

## 2024-02-26 MED ORDER — DEXTROAMPHETAMINE-AMPHETAMINE ER 50 MG CAPSULE,3 BEAD,EXT RELEASE 24HR
50.0000 | EXTENDED_RELEASE_CAPSULE | Freq: Every day | ORAL | 0 refills | Status: DC
Start: 2024-02-26 — End: 2024-03-19

## 2024-02-26 NOTE — Progress Notes (Signed)
 [facility] PSYCHIATRY  [address] [place] [address]  Phone: [phone]  Fax: [phone]    Name: Theresa Palmer   DOB:  [birth year]    Age:  38 y.o.   Sex:  female   MRN:  [id number]   CSN:     [id number]    Date of Service: 02/26/2024  Place of Service: Outpatient Office  Chief Complaint:   Chief Complaint   Patient presents with    ADHD    Anxiety       Vitals:   Temp 36.8 C (98.3 F) (Thermal Scan)   Ht 1.651 m (5' 5)   Wt 51.7 kg (114 lb)   BMI 18.97 kg/m         Allergies   Allergen Reactions    Keppra [Levetiracetam]            Subjective:     Theresa Palmer comes in for a follow-up appointment. She reports that the Mydayis has been working well, feels that is much better and Adderall or Concerta . Stated she has been able to focus and concentrate better. States that her anxiety has also decreased since starting this medication. States that she still feels that things can get better, still finding herself getting distracted, but not as bad as before. Sleeping between six and eight hours my. No other issues reported.        Objective:     Review of Systems   Constitutional:  Negative for fatigue and fever.   HENT:  Negative for congestion.    Respiratory:  Negative for cough.    Neurological:  Negative for tremors, seizures and headaches.   Psychiatric/Behavioral:  Positive for decreased concentration. Negative for hallucinations, self-injury, sleep disturbance and suicidal ideas. The patient is nervous/anxious.       Past Medical History:   Diagnosis Date    Anxiety     Attention deficit hyperactivity disorder (ADHD)     Epileptic seizure (CMS HCC)     PCOS (polycystic ovarian syndrome)      Family Medical History:       Problem Relation (Age of Onset)    Thyroid  Disease Other            Social History     Socioeconomic History    Marital status: Married    Number of children: 1    Years of education: 14    Highest education level: Some college, no degree   Occupational History     Occupation: stay at home mom   Tobacco Use    Smoking status: Never     Passive exposure: Never    Smokeless tobacco: Never   Vaping Use    Vaping status: Never Used   Substance and Sexual Activity    Alcohol use: Yes     Comment: occ    Drug use: Not Currently    Sexual activity: Yes     Current Outpatient Medications   Medication Sig    busPIRone  (BUSPAR ) 10 mg Oral Tablet Take 1 Tablet (10 mg total) by mouth Twice daily    dextroamphetamine -amphetamine  (MYDAYIS) 50 mg Oral capsule, ER triphasic 24 hr Take 50 Capsules by mouth Daily     Physical Exam  Psychiatric:         Thought Content: Thought content does not include homicidal or suicidal ideation. Thought content does not include homicidal or suicidal plan.     Psych (det) : The patient's general attitude and appearance are normal except  as noted.  Theresa Palmer has a(n) cooperative attitude.  Her attire is dressed appropriately.  Appearance is clean.  Her eye contact is fair.  The patient has a(n) normal gait.  General motor activity is calm.  Mood is anxious.  Her affect is stable.  Speech is normal.  Her perception is normal.  The patient has no hallucinations.  Thought form is conversational.  The patient has a(n) normal thought rate.  Her thought content is preoccupied.  She expresses no homicidal and no suicidal ideation. She expresses no suicidal plans and no homicidal plans. The patient has no suicidal attempts and not committed/attempted homicide.  Consciousness is alert.  She is oriented to person, place and time.  Memory intact.  Backward memory deficit span is good.  Forward memory deficit span is good.  General knowledge is consistent with education.  She has abstract thinking .  Her intellectual ability is average.  Insight is fair.  Judgement is fair.             Assessment      Current Outpatient Medications   Medication Sig    busPIRone  (BUSPAR ) 10 mg Oral Tablet Take 1 Tablet (10 mg total) by mouth Twice daily    dextroamphetamine -amphetamine   (MYDAYIS) 50 mg Oral capsule, ER triphasic 24 hr Take 50 Capsules by mouth Daily       Medications reviewed.  Medication compliance: yes  Medication response: fair  Side effects/ Adverse reactions: no    Theresa Palmer was seen today for adhd and anxiety.    Diagnoses and all orders for this visit:    1. ADHD (attention deficit hyperactivity disorder) (Primary)  -     dextroamphetamine -amphetamine  (MYDAYIS) 50 mg Oral capsule, ER triphasic 24 hr; Take 50 Capsules by mouth Daily    2. Mood disorder (CMS HCC)    3. Anxiety  -     busPIRone  (BUSPAR ) 10 mg Oral Tablet; Take 1 Tablet (10 mg total) by mouth Twice daily      ENCOUNTER DIAGNOSES     ICD-10-CM   1. ADHD (attention deficit hyperactivity disorder)  F90.9   2. Mood disorder (CMS HCC)  F39   3. Anxiety  F41.9       Plan:      1.      We discussed about her current symptoms, treatment and treatment options. Discussed about her current stressors and how she he has been coping with them and how they contribute with her ongoing symptoms.  2.      Increase mydayis to 50mg  a day started to help with ADHD.  3. Continue rest of med as presc.  4.      Return to clinic in 4 weeks to reevaluate symptoms and adjust medication if needed.      Patient Goals:      Goals        Eat a balanced, healthy diet      Exercise 3x per week (30 min per time)      Take medications as directed             Education:     Safety: Denies any thoughts of wanting to hurt self or others, not safety issue identified as of this moment.   Medication Risk: The patient was educated about the risk, benefit, and alternative of the medication/treatment including potential side effects.  The patient is aware and verbalized understanding about them.   Compliance: The patient was educated about the need to be compliant  with treatment to achieve a better therapeutic outcome.    Weight: The patient was educated about diet and exercise to maintain an adequate weight and minimize the risk of metabolic syndrome.    Substance use: Patient was counseled about the use of alcohol or any illicit drug especially while using medication.        Shevaun Lovan GORMAN Jackson Southerly, MD

## 2024-03-18 ENCOUNTER — Other Ambulatory Visit: Payer: Self-pay

## 2024-03-19 ENCOUNTER — Encounter (INDEPENDENT_AMBULATORY_CARE_PROVIDER_SITE_OTHER): Payer: Self-pay | Admitting: Psychiatry

## 2024-03-19 ENCOUNTER — Ambulatory Visit (INDEPENDENT_AMBULATORY_CARE_PROVIDER_SITE_OTHER): Payer: Self-pay | Admitting: Psychiatry

## 2024-03-19 VITALS — Temp 98.3°F | Ht 65.0 in | Wt 114.0 lb

## 2024-03-19 DIAGNOSIS — F909 Attention-deficit hyperactivity disorder, unspecified type: Secondary | ICD-10-CM

## 2024-03-19 DIAGNOSIS — F419 Anxiety disorder, unspecified: Secondary | ICD-10-CM

## 2024-03-19 MED ORDER — DEXTROAMPHETAMINE-AMPHETAMINE ER 37.5 MG CAPSULE, 3 BEAD, EXT REL 24HR: 1.0000 | EXTENDED_RELEASE_CAPSULE | Freq: Every day | ORAL | 0 refills | Status: DC

## 2024-03-19 MED ORDER — BUSPIRONE 10 MG TABLET: 10.0000 mg | ORAL_TABLET | Freq: Two times a day (BID) | ORAL | 1 refills | Status: DC

## 2024-03-19 NOTE — Progress Notes (Signed)
 [facility] PSYCHIATRY  [address] [place] [address]  Phone: [phone]  Fax: [phone]    Name: Theresa Palmer   DOB:  05-05-1986    Age:  38 y.o.   Sex:  female   MRN:  [id number]   CSN:     [id number]    Date of Service: 03/19/2024  Place of Service: Outpatient Office  Chief Complaint:   Chief Complaint   Patient presents with    ADHD    Anxiety       Vitals:   Temp 36.8 C (98.3 F) (Thermal Scan)   Ht 1.651 m (5' 5)   Wt 51.7 kg (114 lb)   BMI 18.97 kg/m         Allergies   Allergen Reactions    Keppra [Levetiracetam]            Subjective:     Theresa Palmer comes in for a follow-up appointment. She reports that the 50 milligrams of mydayis was causing her to have diarrhea. States that she was having diarrhea times a day. She reports that this started as soon as she went up on the dose. States that on the lower dose this is not happening, the diarrhea disappeared so she went back to the 37.5 milligrams. States that aside from this, she has been going well, anxiety and depression has been well-controlled. No other issues reported.        Objective:     Review of Systems   Constitutional:  Negative for fatigue and fever.   HENT:  Negative for congestion.    Respiratory:  Negative for cough.    Neurological:  Negative for tremors, seizures and headaches.   Psychiatric/Behavioral:  Positive for decreased concentration. Negative for hallucinations, self-injury, sleep disturbance and suicidal ideas. The patient is nervous/anxious.       Past Medical History:   Diagnosis Date    Anxiety     Attention deficit hyperactivity disorder (ADHD)     Epileptic seizure (CMS HCC)     PCOS (polycystic ovarian syndrome)      Family Medical History:       Problem Relation (Age of Onset)    Thyroid  Disease Other            Social History     Socioeconomic History    Marital status: Married    Number of children: 1    Years of education: 14    Highest education level: Some college, no degree    Occupational History    Occupation: stay at home mom   Tobacco Use    Smoking status: Never     Passive exposure: Never    Smokeless tobacco: Never   Vaping Use    Vaping status: Never Used   Substance and Sexual Activity    Alcohol use: Yes     Comment: occ    Drug use: Not Currently    Sexual activity: Yes     Current Outpatient Medications   Medication Sig    busPIRone  (BUSPAR ) 10 mg Oral Tablet Take 1 Tablet (10 mg total) by mouth Twice daily    dextroamphetamine -amphetamine  (MYDAYIS) 37.5 mg Oral capsule, ER triphasic 24 hr Take 1 Capsule by mouth Daily    dextroamphetamine -amphetamine  (MYDAYIS) 37.5 mg Oral capsule, ER triphasic 24 hr Take 1 Capsule by mouth Daily To be filled no sooner than 04/16/24     Physical Exam  Psychiatric:         Thought Content: Thought  content does not include homicidal or suicidal ideation. Thought content does not include homicidal or suicidal plan.     Psych (det) : The patient's general attitude and appearance are normal except as noted.  Theresa Palmer has a(n) cooperative attitude.  Her attire is dressed appropriately.  Appearance is clean.  Her eye contact is fair.  The patient has a(n) normal gait.  General motor activity is calm.  Mood is anxious.  Her affect is stable.  Speech is normal.  Her perception is normal.  The patient has no hallucinations.  Thought form is conversational.  The patient has a(n) normal thought rate.  Her thought content is preoccupied.  She expresses no homicidal and no suicidal ideation. She expresses no suicidal plans and no homicidal plans. The patient has no suicidal attempts and not committed/attempted homicide.  Consciousness is alert.  She is oriented to person, place and time.  Memory intact.  Backward memory deficit span is good.  Forward memory deficit span is good.  General knowledge is consistent with education.  She has abstract thinking .  Her intellectual ability is average.  Insight is fair.  Judgement is fair.             Assessment       Current Outpatient Medications   Medication Sig    busPIRone  (BUSPAR ) 10 mg Oral Tablet Take 1 Tablet (10 mg total) by mouth Twice daily    dextroamphetamine -amphetamine  (MYDAYIS) 37.5 mg Oral capsule, ER triphasic 24 hr Take 1 Capsule by mouth Daily    dextroamphetamine -amphetamine  (MYDAYIS) 37.5 mg Oral capsule, ER triphasic 24 hr Take 1 Capsule by mouth Daily To be filled no sooner than 04/16/24       Medications reviewed.  Medication compliance: yes  Medication response: fair  Side effects/ Adverse reactions: no    Theresa Palmer was seen today for adhd and anxiety.    Diagnoses and all orders for this visit:    1. ADHD (attention deficit hyperactivity disorder) (Primary)  -     dextroamphetamine -amphetamine  (MYDAYIS) 37.5 mg Oral capsule, ER triphasic 24 hr; Take 1 Capsule by mouth Daily  -     dextroamphetamine -amphetamine  (MYDAYIS) 37.5 mg Oral capsule, ER triphasic 24 hr; Take 1 Capsule by mouth Daily To be filled no sooner than 04/16/24    2. Anxiety  -     busPIRone  (BUSPAR ) 10 mg Oral Tablet; Take 1 Tablet (10 mg total) by mouth Twice daily      ENCOUNTER DIAGNOSES     ICD-10-CM   1. ADHD (attention deficit hyperactivity disorder)  F90.9   2. Anxiety  F41.9       Plan:      1.      We discussed about her current symptoms, treatment and treatment options. Discussed about her current stressors and how she he has been coping with them and how they contribute with her ongoing symptoms.  2.      decrease mydayis to 37.5mg  a day started to help with ADHD.  3. Continue rest of med as presc.  4.      Return to clinic in 8 weeks to reevaluate symptoms and adjust medication if needed.      Patient Goals:      Goals        Eat a balanced, healthy diet      Exercise 3x per week (30 min per time)      Take medications as directed  Education:     Safety: Denies any thoughts of wanting to hurt self or others, not safety issue identified as of this moment.   Medication Risk: The patient was educated about the risk,  benefit, and alternative of the medication/treatment including potential side effects.  The patient is aware and verbalized understanding about them.   Compliance: The patient was educated about the need to be compliant with treatment to achieve a better therapeutic outcome.    Weight: The patient was educated about diet and exercise to maintain an adequate weight and minimize the risk of metabolic syndrome.   Substance use: Patient was counseled about the use of alcohol or any illicit drug especially while using medication.        Antawn Sison GORMAN Jackson Southerly, MD

## 2024-03-24 ENCOUNTER — Telehealth (HOSPITAL_COMMUNITY): Payer: Self-pay

## 2024-03-24 NOTE — Telephone Encounter (Signed)
 Patient calling in wanting to schedule new patient visit. Advised patient the next available isn't until march of 2026. Patient stated she was unsure if she could wait that long because she has some concerns.   Patient stated that her periods are irregular where she can have one a month or every 3 months and during that time she passes big blood clots and its painful with sharp pains.     Please call patient to discuss concerns and appointment scheduling.     Caton Popowski [employer]  03/24/2024, 09:41

## 2024-03-25 ENCOUNTER — Encounter (INDEPENDENT_AMBULATORY_CARE_PROVIDER_SITE_OTHER): Payer: Self-pay | Admitting: Psychiatry

## 2024-04-02 ENCOUNTER — Other Ambulatory Visit: Payer: Self-pay

## 2024-04-03 ENCOUNTER — Telehealth (HOSPITAL_COMMUNITY): Payer: Self-pay

## 2024-04-03 ENCOUNTER — Telehealth (INDEPENDENT_AMBULATORY_CARE_PROVIDER_SITE_OTHER): Payer: Self-pay

## 2024-04-03 ENCOUNTER — Ambulatory Visit (INDEPENDENT_AMBULATORY_CARE_PROVIDER_SITE_OTHER): Payer: Self-pay

## 2024-04-03 NOTE — Telephone Encounter (Signed)
 Patient calling in to reschedule appointment today because she is sick. Patient was triaged for this appointment for a sooner appointment and i do not have anything to offer patient to reschedule with.     Please contact patient to reschedule.     Horst Ostermiller [employer]  04/03/2024, 08:10

## 2024-04-03 NOTE — Telephone Encounter (Signed)
 Called pt to get her r/s for her appt today 10/31 at 2:20 per patient. Pt stated she was sick and could not come in. I scheduled her to my next available.

## 2024-04-16 ENCOUNTER — Other Ambulatory Visit: Payer: Self-pay

## 2024-04-17 ENCOUNTER — Ambulatory Visit

## 2024-04-17 ENCOUNTER — Encounter (INDEPENDENT_AMBULATORY_CARE_PROVIDER_SITE_OTHER): Payer: Self-pay

## 2024-04-17 ENCOUNTER — Ambulatory Visit (HOSPITAL_BASED_OUTPATIENT_CLINIC_OR_DEPARTMENT_OTHER)

## 2024-04-17 ENCOUNTER — Other Ambulatory Visit: Payer: Self-pay

## 2024-04-17 VITALS — BP 122/80 | HR 94 | Temp 97.3°F | Ht 65.0 in | Wt 242.0 lb

## 2024-04-17 DIAGNOSIS — N926 Irregular menstruation, unspecified: Secondary | ICD-10-CM | POA: Insufficient documentation

## 2024-04-17 LAB — THYROID STIMULATING HORMONE (SENSITIVE TSH): TSH: 2.902 u[IU]/mL (ref 0.350–4.940)

## 2024-04-17 LAB — LH: LUTEINIZING HORMONE: 15.1 m[IU]/mL

## 2024-04-17 LAB — PROGESTERONE: PROGESTERONE: <0.5 ng/mL

## 2024-04-17 LAB — FSH: FSH: 7.8 m[IU]/mL

## 2024-04-17 LAB — ESTRADIOL: ESTRADIOL: 31 pg/mL

## 2024-04-17 LAB — PROLACTIN: PROLACTIN: 8.7 ng/mL (ref 5.2–26.5)

## 2024-04-23 NOTE — Progress Notes (Signed)
 OB/GYN, MEDICAL OFFICE BUILDING [facility]  [address]  [place] [address]  Operated by [facility]    Name: Theresa Palmer MRN:  [id number]   Date: 04/17/2024 DOB:  [birth year] (38 y.o.)     Subjective:      Theresa Palmer is a 38 y.o. female here for problem exam.     Current Complaints: patient with irregular menses every 1-3 months,  Menses heavy with clots and cramping.  She has had a diagnosis of PCOS since 2012.  Menses. Have been heavier the last 6 months.  Has some perimenopausal sx--sweaty  Patient thinking about another baby possible      Gynecologic History  Patient's last menstrual period was 03/22/2024 (exact date).    Obstetric History  [id number]    Past Medical History:   Diagnosis Date    Anxiety     Attention deficit hyperactivity disorder (ADHD)     Epileptic seizure (CMS HCC)     PCOS (polycystic ovarian syndrome)      Family Medical History:       Problem Relation (Age of Onset)    Thyroid  Disease Other            Current Outpatient Medications   Medication Sig Dispense Refill    busPIRone  (BUSPAR ) 10 mg Oral Tablet Take 1 Tablet (10 mg total) by mouth Twice daily 60 Tablet 1    dextroamphetamine -amphetamine  (MYDAYIS) 37.5 mg Oral capsule, ER triphasic 24 hr Take 1 Capsule by mouth Daily 30 Capsule 0    dextroamphetamine -amphetamine  (MYDAYIS) 37.5 mg Oral capsule, ER triphasic 24 hr Take 1 Capsule by mouth Daily To be filled no sooner than 04/16/24 30 Capsule 0     No current facility-administered medications for this visit.     Allergies[1]    Review of Systems  REVIEW OF SYSTEMS :  Constitutional: Negative. Denies any fever, chills or fatigue   Skin: Negative.  Denies any rash or skin changes  HEENT: Negative.  Denies any headaches, visual changes, swallowing issues  Cardiovascular: Negative.  Denies any chest pain or palpitations  Respiratory: Negative.  Denies any difficulty breathing or cough  Gastrointestinal: Negative.  Denies any change in bowel habits,  nausea or vomiting  Genitourinary:        See HPI   Musculoskeletal: Negative.   Neurological: Negative.    Psychiatric: Negative.      Objective:   BP 122/80   Pulse 94   Temp 36.3 C (97.3 F) (Thermal Scan)   Ht 1.651 m (5' 5)   Wt 110 kg (242 lb)   LMP 03/22/2024 (Exact Date)   BMI 40.27 kg/m         General Appearance: cooperative, no distress, appears stated age,   Pelvic Exam Female: Exam deferred.         Assessment:     Problem List Items Addressed This Visit    None  Visit Diagnoses         Irregular menses    -  Primary    Relevant Orders    ESTRADIOL  (Completed)    PROLACTIN (Completed)    PROGESTERONE  (Completed)    THYROID  STIMULATING HORMONE (SENSITIVE TSH) (Completed)    US  PELVIS    FSH (Completed)    LH (Completed)            Orders Placed This Encounter    US  PELVIS    CANCELED: FSH    ESTRADIOL     PROLACTIN  PROGESTERONE     THYROID  STIMULATING HORMONE (SENSITIVE TSH)    FSH    LH       Plan:     Will order labs to see if perimenopausal.  Order written to schedule a pelvic us  as well  follow up appt after labs and us   On the day of the encounter, a total of  20 minutes was spent on this patient encounter including review of historical information, examination, documentation and post-visit activities.      No follow-ups on file.    Beverley Candle, MD          [1]   Allergies  Allergen Reactions    Keppra [Levetiracetam]

## 2024-04-24 ENCOUNTER — Encounter (INDEPENDENT_AMBULATORY_CARE_PROVIDER_SITE_OTHER): Payer: Self-pay

## 2024-04-25 ENCOUNTER — Ambulatory Visit (INDEPENDENT_AMBULATORY_CARE_PROVIDER_SITE_OTHER): Payer: Self-pay

## 2024-04-27 NOTE — Result Encounter Note (Signed)
Patient informed of her results

## 2024-05-04 ENCOUNTER — Other Ambulatory Visit: Payer: Self-pay

## 2024-05-05 ENCOUNTER — Ambulatory Visit: Admission: RE | Admit: 2024-05-05 | Discharge: 2024-05-05 | Disposition: A | Source: Ambulatory Visit

## 2024-05-05 DIAGNOSIS — N926 Irregular menstruation, unspecified: Secondary | ICD-10-CM

## 2024-05-12 ENCOUNTER — Other Ambulatory Visit: Payer: Self-pay

## 2024-05-13 ENCOUNTER — Encounter (INDEPENDENT_AMBULATORY_CARE_PROVIDER_SITE_OTHER): Payer: Self-pay | Admitting: Psychiatry

## 2024-05-13 ENCOUNTER — Other Ambulatory Visit: Payer: Self-pay

## 2024-05-13 ENCOUNTER — Ambulatory Visit (INDEPENDENT_AMBULATORY_CARE_PROVIDER_SITE_OTHER): Admitting: Psychiatry

## 2024-05-13 VITALS — Temp 98.3°F | Ht 65.0 in | Wt 114.0 lb

## 2024-05-13 DIAGNOSIS — F909 Attention-deficit hyperactivity disorder, unspecified type: Secondary | ICD-10-CM

## 2024-05-13 DIAGNOSIS — F39 Unspecified mood [affective] disorder: Secondary | ICD-10-CM

## 2024-05-13 DIAGNOSIS — F419 Anxiety disorder, unspecified: Secondary | ICD-10-CM

## 2024-05-13 MED ORDER — DEXTROAMPHETAMINE-AMPHETAMINE ER 50 MG CAPSULE,3 BEAD,EXT RELEASE 24HR: 1.0000 | EXTENDED_RELEASE_CAPSULE | Freq: Every day | ORAL | 0 refills | Status: DC

## 2024-05-13 MED ORDER — BUSPIRONE 10 MG TABLET: 10.0000 mg | ORAL_TABLET | Freq: Two times a day (BID) | ORAL | 1 refills | Status: DC

## 2024-05-13 NOTE — Progress Notes (Signed)
 [facility] PSYCHIATRY  [address] [place] [address]  Phone: [phone]  Fax: [phone]    Name: Theresa Palmer   DOB:  March 02, 1986    Age:  38 y.o.   Sex:  female   MRN:  [id number]   CSN:     [id number]    Date of Service: 05/13/2024  Place of Service: Outpatient Office  Chief Complaint:   Chief Complaint   Patient presents with    ADHD    Anxiety    Depression       Vitals:   Temp 36.8 C (98.3 F) (Thermal Scan)   Ht 1.651 m (5' 5)   Wt 51.7 kg (114 lb)   LMP 03/22/2024 (Exact Date)   BMI 18.97 kg/m         Allergies   Allergen Reactions    Keppra [Levetiracetam]            Subjective:     Lurdes comes in for a follow-up appointment. She reports that on the 37.5 milligrams of Mydayis she has been noticing that she has been struggling a bit with her focus and concentration, felt that the 50 milligrams was working way better. She reports that she has some extra of the 50 milligrams, so she took them which confirmed that she was doing better on it, felt that the issue that she was having back then was just a stomach bug, since she was able to tolerate it well this. States that due to the lack of focus and concentration at times her anxiety is slightly high, gets easily overwhelmed. No other issues reported.        Objective:     Review of Systems   Constitutional:  Negative for fatigue and fever.   HENT:  Negative for congestion.    Respiratory:  Negative for cough.    Neurological:  Negative for tremors, seizures and headaches.   Psychiatric/Behavioral:  Positive for decreased concentration. Negative for hallucinations, self-injury, sleep disturbance and suicidal ideas. The patient is nervous/anxious.       Past Medical History:   Diagnosis Date    Anxiety     Attention deficit hyperactivity disorder (ADHD)     Epileptic seizure (CMS HCC)     PCOS (polycystic ovarian syndrome)      Family Medical History:       Problem Relation (Age of Onset)    Thyroid  Disease Other             Social History     Socioeconomic History    Marital status: Married    Number of children: 1    Years of education: 14    Highest education level: Some college, no degree   Occupational History    Occupation: stay at home mom   Tobacco Use    Smoking status: Never     Passive exposure: Never    Smokeless tobacco: Never   Vaping Use    Vaping status: Never Used   Substance and Sexual Activity    Alcohol use: Yes     Comment: occ    Drug use: Not Currently    Sexual activity: Yes     Current Outpatient Medications   Medication Sig    busPIRone  (BUSPAR ) 10 mg Oral Tablet Take 1 Tablet (10 mg total) by mouth Twice daily     Physical Exam  Psychiatric:         Thought Content: Thought content does not include homicidal or suicidal  ideation. Thought content does not include homicidal or suicidal plan.     Psych (det) : The patient's general attitude and appearance are normal except as noted.  Keily has a(n) cooperative attitude.  Her attire is dressed appropriately.  Appearance is clean.  Her eye contact is fair.  The patient has a(n) normal gait.  General motor activity is calm.  Mood is anxious.  Her affect is stable.  Speech is normal.  Her perception is normal.  The patient has no hallucinations.  Thought form is conversational.  The patient has a(n) normal thought rate.  Her thought content is preoccupied.  She expresses no homicidal and no suicidal ideation. She expresses no suicidal plans and no homicidal plans. The patient has no suicidal attempts and not committed/attempted homicide.  Consciousness is alert.  She is oriented to person, place and time.  Memory intact.  Backward memory deficit span is good.  Forward memory deficit span is good.  General knowledge is consistent with education.  She has abstract thinking .  Her intellectual ability is average.  Insight is fair.  Judgement is fair.             Assessment      Current Outpatient Medications   Medication Sig    busPIRone  (BUSPAR ) 10 mg Oral Tablet  Take 1 Tablet (10 mg total) by mouth Twice daily       Medications reviewed.  Medication compliance: yes  Medication response: fair  Side effects/ Adverse reactions: no    Aspyn was seen today for adhd, anxiety and depression.    Diagnoses and all orders for this visit:    1. ADHD (attention deficit hyperactivity disorder) (Primary)  -     dextroamphetamine -amphetamine  (MYDAYIS) 50 mg Oral capsule, ER triphasic 24 hr; Take 1 Capsule by mouth Daily . Presc 1 of  2  -     dextroamphetamine -amphetamine  (MYDAYIS) 50 mg Oral capsule, ER triphasic 24 hr; Take 1 Capsule by mouth Daily . To be filled no sooner than 06/10/24. Presc 2 of 2    2. Anxiety  -     busPIRone  (BUSPAR ) 10 mg Oral Tablet; Take 1 Tablet (10 mg total) by mouth Twice daily    3. Mood disorder (CMS HCC)      ENCOUNTER DIAGNOSES     ICD-10-CM   1. ADHD (attention deficit hyperactivity disorder)  F90.9   2. Anxiety  F41.9   3. Mood disorder (CMS HCC)  F39       Plan:      1.      We discussed about her current symptoms, treatment and treatment options. Discussed about her current stressors and how she he has been coping with them and how they contribute with her ongoing symptoms.  2.      increase mydayis back to 50mg  a day started to help with ADHD.  3. Continue rest of med as presc.  4.      Return to clinic in 8 weeks to reevaluate symptoms and adjust medication if needed.      Patient Goals:      Goals        Eat a balanced, healthy diet      Exercise 3x per week (30 min per time)      Take medications as directed             Education:     Safety: Denies any thoughts of wanting to hurt self or others, not safety issue  identified as of this moment.   Medication Risk: The patient was educated about the risk, benefit, and alternative of the medication/treatment including potential side effects.  The patient is aware and verbalized understanding about them.   Compliance: The patient was educated about the need to be compliant with treatment to achieve a  better therapeutic outcome.    Weight: The patient was educated about diet and exercise to maintain an adequate weight and minimize the risk of metabolic syndrome.   Substance use: Patient was counseled about the use of alcohol or any illicit drug especially while using medication.        Mykelti Goldenstein GORMAN Jackson Southerly, MD

## 2024-07-03 ENCOUNTER — Encounter (INDEPENDENT_AMBULATORY_CARE_PROVIDER_SITE_OTHER): Payer: Self-pay | Admitting: Psychiatry

## 2024-07-03 ENCOUNTER — Other Ambulatory Visit: Payer: Self-pay

## 2024-07-03 ENCOUNTER — Ambulatory Visit (INDEPENDENT_AMBULATORY_CARE_PROVIDER_SITE_OTHER): Payer: Self-pay | Admitting: Psychiatry

## 2024-07-03 VITALS — Temp 98.3°F | Ht 65.0 in | Wt 114.0 lb

## 2024-07-03 DIAGNOSIS — F39 Unspecified mood [affective] disorder: Secondary | ICD-10-CM

## 2024-07-03 DIAGNOSIS — F419 Anxiety disorder, unspecified: Secondary | ICD-10-CM

## 2024-07-03 DIAGNOSIS — F909 Attention-deficit hyperactivity disorder, unspecified type: Secondary | ICD-10-CM

## 2024-07-03 MED ORDER — BUSPIRONE 15 MG TABLET: 15.0000 mg | ORAL_TABLET | Freq: Three times a day (TID) | ORAL | 2 refills | Status: AC

## 2024-07-03 MED ORDER — DEXTROAMPHETAMINE-AMPHETAMINE ER 50 MG CAPSULE,3 BEAD,EXT RELEASE 24HR: 1.0000 | EXTENDED_RELEASE_CAPSULE | Freq: Every day | ORAL | 0 refills | Status: AC

## 2024-07-03 NOTE — Progress Notes (Signed)
 [facility]  [address] [place] [address]  Phone: [phone]  Fax: [phone]    Name: Theresa Palmer   DOB:  1985/07/27    Age:  39 y.o.   Sex:  female   MRN:  [id number]   CSN:     [id number]    Date of Service: 07/03/2024  Place of Service: Outpatient Office  Chief Complaint:   Chief Complaint   Patient presents with    ADHD    Anxiety    Depression       Vitals:   Temp 36.8 C (98.3 F) (Thermal Scan)   Ht 1.651 m (5' 5)   Wt 51.7 kg (114 lb)   BMI 18.97 kg/m         Allergies   Allergen Reactions    Keppra [Levetiracetam]            Subjective:     Theresa Palmer comes in for her follow-up appointment. She reports that she continues to take her medications as prescribed. States that has been under a lot of stress recently, so has been feeling a bit more anxious or overwhelmed which causes her to have issues with her focus and concentration even after taking the mydayis. Inquiring about increasing her BuSpar  to try to help with his anxiety. States that she has been working in therapy through some repressed memories adjust came afloat about her father, so this has been causing her to feel very anxious and overwhelmed. Sleep has been okay, sleeping between six and eight hours my. No other issues reported.        Objective:     Review of Systems   Constitutional:  Negative for fatigue and fever.   HENT:  Negative for congestion.    Respiratory:  Negative for cough.    Neurological:  Negative for tremors, seizures and headaches.   Psychiatric/Behavioral:  Positive for decreased concentration. Negative for hallucinations, self-injury, sleep disturbance and suicidal ideas. The patient is nervous/anxious.       Past Medical History:   Diagnosis Date    Anxiety     Attention deficit hyperactivity disorder (ADHD)     Epileptic seizure (CMS HCC)     PCOS (polycystic ovarian syndrome)      Family Medical History:       Problem Relation (Age of Onset)    Thyroid  Disease Other             Social History     Socioeconomic History    Marital status: Married    Number of children: 1    Years of education: 14    Highest education level: Some college, no degree   Occupational History    Occupation: stay at home mom   Tobacco Use    Smoking status: Never     Passive exposure: Never    Smokeless tobacco: Never   Vaping Use    Vaping status: Never Used   Substance and Sexual Activity    Alcohol use: Yes     Comment: occ    Drug use: Not Currently    Sexual activity: Yes     Current Outpatient Medications   Medication Sig    buspirone  (BUSPAR ) 15 mg Oral Tablet Take 1 Tablet (15 mg total) by mouth Three times a day    dextroamphetamine -amphetamine  (MYDAYIS) 50 mg Oral capsule, ER triphasic 24 hr Take 1 Capsule by mouth Daily . To be filled no sooner than 08/28/24. Presc 3 of 3  dextroamphetamine -amphetamine  (MYDAYIS) 50 mg Oral capsule, ER triphasic 24 hr Take 1 Capsule by mouth Daily . Presc 1 of  3    dextroamphetamine -amphetamine  (MYDAYIS) 50 mg Oral capsule, ER triphasic 24 hr Take 1 Capsule by mouth Daily . To be filled no sooner than 07/31/24. Presc 2 of 3     Physical Exam  Psychiatric:         Thought Content: Thought content does not include homicidal or suicidal ideation. Thought content does not include homicidal or suicidal plan.     Psych (det) : The patient's general attitude and appearance are normal except as noted.  Alfhild has a(n) cooperative attitude.  Her attire is dressed appropriately.  Appearance is clean.  Her eye contact is fair.  The patient has a(n) normal gait.  General motor activity is calm.  Mood is anxious.  Her affect is stable.  Speech is normal.  Her perception is normal.  The patient has no hallucinations.  Thought form is conversational.  The patient has a(n) normal thought rate.  Her thought content is preoccupied.  She expresses no homicidal and no suicidal ideation. She expresses no suicidal plans and no homicidal plans. The patient has no suicidal attempts and not  committed/attempted homicide.  Consciousness is alert.  She is oriented to person, place and time.  Memory intact.  Backward memory deficit span is good.  Forward memory deficit span is good.  General knowledge is consistent with education.  She has abstract thinking .  Her intellectual ability is average.  Insight is fair.  Judgement is fair.             Assessment      Current Outpatient Medications   Medication Sig    buspirone  (BUSPAR ) 15 mg Oral Tablet Take 1 Tablet (15 mg total) by mouth Three times a day    dextroamphetamine -amphetamine  (MYDAYIS) 50 mg Oral capsule, ER triphasic 24 hr Take 1 Capsule by mouth Daily . To be filled no sooner than 08/28/24. Presc 3 of 3    dextroamphetamine -amphetamine  (MYDAYIS) 50 mg Oral capsule, ER triphasic 24 hr Take 1 Capsule by mouth Daily . Presc 1 of  3    dextroamphetamine -amphetamine  (MYDAYIS) 50 mg Oral capsule, ER triphasic 24 hr Take 1 Capsule by mouth Daily . To be filled no sooner than 07/31/24. Presc 2 of 3       Medications reviewed.  Medication compliance: yes  Medication response: fair  Side effects/ Adverse reactions: no    Rebeka was seen today for adhd, anxiety and depression.    Diagnoses and all orders for this visit:    1. ADHD (attention deficit hyperactivity disorder) (Primary)  -     dextroamphetamine -amphetamine  (MYDAYIS) 50 mg Oral capsule, ER triphasic 24 hr; Take 1 Capsule by mouth Daily . To be filled no sooner than 08/28/24. Presc 3 of 3  -     dextroamphetamine -amphetamine  (MYDAYIS) 50 mg Oral capsule, ER triphasic 24 hr; Take 1 Capsule by mouth Daily . Presc 1 of  3  -     dextroamphetamine -amphetamine  (MYDAYIS) 50 mg Oral capsule, ER triphasic 24 hr; Take 1 Capsule by mouth Daily . To be filled no sooner than 07/31/24. Presc 2 of 3    2. Anxiety  -     buspirone  (BUSPAR ) 15 mg Oral Tablet; Take 1 Tablet (15 mg total) by mouth Three times a day    3. Mood disorder (CMS HCC)      ENCOUNTER DIAGNOSES  ICD-10-CM   1. ADHD (attention deficit  hyperactivity disorder)  F90.9   2. Anxiety  F41.9   3. Mood disorder (CMS HCC)  F39       Plan:      1.      We discussed about her current symptoms, treatment and treatment options. Discussed about her current stressors and how she he has been coping with them and how they contribute with her ongoing symptoms.  2.      Increase Buspar  to 15 mg tid to help with anxiety.  3. Continue rest of med as presc.  4.      Return to clinic in 8 weeks to reevaluate symptoms and adjust medication if needed.      Patient Goals:      Goals        Eat a balanced, healthy diet      Exercise 3x per week (30 min per time)      Take medications as directed             Education:     Safety: Denies any thoughts of wanting to hurt self or others, not safety issue identified as of this moment.   Medication Risk: The patient was educated about the risk, benefit, and alternative of the medication/treatment including potential side effects.  The patient is aware and verbalized understanding about them.   Compliance: The patient was educated about the need to be compliant with treatment to achieve a better therapeutic outcome.    Weight: The patient was educated about diet and exercise to maintain an adequate weight and minimize the risk of metabolic syndrome.   Substance use: Patient was counseled about the use of alcohol or any illicit drug especially while using medication.        Kyrian Stage GORMAN Jackson Southerly, MD

## 2024-08-18 ENCOUNTER — Ambulatory Visit (INDEPENDENT_AMBULATORY_CARE_PROVIDER_SITE_OTHER): Payer: Self-pay | Admitting: FAMILY MEDICINE

## 2024-09-16 ENCOUNTER — Encounter (INDEPENDENT_AMBULATORY_CARE_PROVIDER_SITE_OTHER): Payer: Self-pay | Admitting: Psychiatry
